# Patient Record
Sex: Male | Born: 1985 | Race: White | Hispanic: No | Marital: Single | Smoking: Never smoker
Health system: Southern US, Community
[De-identification: ages and names within clinical notes are randomized; demographics above are authoritative.]

## PROBLEM LIST (undated history)

## (undated) ENCOUNTER — Emergency Department (HOSPITAL_COMMUNITY): Source: Home / Self Care

## (undated) DIAGNOSIS — I2699 Other pulmonary embolism without acute cor pulmonale: Secondary | ICD-10-CM

## (undated) DIAGNOSIS — J189 Pneumonia, unspecified organism: Secondary | ICD-10-CM

## (undated) DIAGNOSIS — I10 Essential (primary) hypertension: Secondary | ICD-10-CM

## (undated) DIAGNOSIS — I82409 Acute embolism and thrombosis of unspecified deep veins of unspecified lower extremity: Secondary | ICD-10-CM

## (undated) DIAGNOSIS — K219 Gastro-esophageal reflux disease without esophagitis: Secondary | ICD-10-CM

## (undated) DIAGNOSIS — M1990 Unspecified osteoarthritis, unspecified site: Secondary | ICD-10-CM

## (undated) DIAGNOSIS — R4189 Other symptoms and signs involving cognitive functions and awareness: Secondary | ICD-10-CM

## (undated) DIAGNOSIS — E7800 Pure hypercholesterolemia, unspecified: Secondary | ICD-10-CM

## (undated) DIAGNOSIS — M4326 Fusion of spine, lumbar region: Secondary | ICD-10-CM

## (undated) DIAGNOSIS — F988 Other specified behavioral and emotional disorders with onset usually occurring in childhood and adolescence: Secondary | ICD-10-CM

## (undated) HISTORY — PX: OTHER SURGICAL HISTORY: SHX169

## (undated) HISTORY — PX: FRACTURE SURGERY: SHX138

## (undated) HISTORY — DX: Other pulmonary embolism without acute cor pulmonale: I26.99

## (undated) HISTORY — DX: Pneumonia, unspecified organism: J18.9

## (undated) HISTORY — DX: Fusion of spine, lumbar region: M43.26

---
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
## 2009-02-04 ENCOUNTER — Encounter: Payer: Self-pay | Admitting: Gastroenterology

## 2009-04-15 ENCOUNTER — Encounter: Payer: Self-pay | Admitting: Gastroenterology

## 2009-08-27 ENCOUNTER — Encounter: Payer: Self-pay | Admitting: Gastroenterology

## 2009-09-03 ENCOUNTER — Encounter: Payer: Self-pay | Admitting: Gastroenterology

## 2009-09-16 ENCOUNTER — Encounter: Payer: Self-pay | Admitting: Gastroenterology

## 2009-10-31 ENCOUNTER — Emergency Department (HOSPITAL_COMMUNITY): Admission: EM | Admit: 2009-10-31 | Discharge: 2009-10-31 | Payer: Self-pay | Admitting: Emergency Medicine

## 2009-11-07 DIAGNOSIS — Z720 Tobacco use: Secondary | ICD-10-CM

## 2009-11-07 DIAGNOSIS — E785 Hyperlipidemia, unspecified: Secondary | ICD-10-CM

## 2009-11-07 DIAGNOSIS — F32A Depression, unspecified: Secondary | ICD-10-CM

## 2009-11-07 DIAGNOSIS — F411 Generalized anxiety disorder: Secondary | ICD-10-CM

## 2009-11-07 DIAGNOSIS — F17200 Nicotine dependence, unspecified, uncomplicated: Secondary | ICD-10-CM | POA: Insufficient documentation

## 2009-11-07 DIAGNOSIS — F909 Attention-deficit hyperactivity disorder, unspecified type: Secondary | ICD-10-CM | POA: Insufficient documentation

## 2009-11-07 DIAGNOSIS — F329 Major depressive disorder, single episode, unspecified: Secondary | ICD-10-CM

## 2009-11-07 DIAGNOSIS — R1319 Other dysphagia: Secondary | ICD-10-CM

## 2009-11-07 DIAGNOSIS — K219 Gastro-esophageal reflux disease without esophagitis: Secondary | ICD-10-CM | POA: Insufficient documentation

## 2009-11-07 HISTORY — DX: Hyperlipidemia, unspecified: E78.5

## 2009-11-07 HISTORY — DX: Gastro-esophageal reflux disease without esophagitis: K21.9

## 2009-11-07 HISTORY — DX: Generalized anxiety disorder: F41.1

## 2009-11-07 HISTORY — DX: Tobacco use: Z72.0

## 2009-11-07 HISTORY — DX: Depression, unspecified: F32.A

## 2009-11-07 HISTORY — DX: Other dysphagia: R13.19

## 2009-11-07 HISTORY — DX: Attention-deficit hyperactivity disorder, unspecified type: F90.9

## 2009-11-12 ENCOUNTER — Ambulatory Visit: Payer: Self-pay | Admitting: Gastroenterology

## 2009-11-12 ENCOUNTER — Encounter (INDEPENDENT_AMBULATORY_CARE_PROVIDER_SITE_OTHER): Payer: Self-pay | Admitting: *Deleted

## 2009-11-13 ENCOUNTER — Ambulatory Visit: Payer: Self-pay | Admitting: Gastroenterology

## 2009-11-15 ENCOUNTER — Encounter: Payer: Self-pay | Admitting: Gastroenterology

## 2010-02-25 NOTE — Letter (Signed)
Summary: [facility]   Imported By: Sherian Rein 11/19/2009 07:38:05  _____________________________________________________________________  External Attachment:    Type:   Image     Comment:   External Document

## 2010-02-25 NOTE — Procedures (Signed)
Summary: Upper Endoscopy  Patient: Andre Cervantes Note: All result statuses are Final unless otherwise noted.  Tests: (1) Upper Endoscopy (EGD)   EGD Upper Endoscopy       DONE     [facility]     [address]     [place], [place]  [address]           ENDOSCOPY PROCEDURE REPORT           PATIENT:  Andre Cervantes, Andre Cervantes  MR#:  [id number]     BIRTHDATE:  [birth year], 24 yrs. old  GENDER:  male           ENDOSCOPIST:  Vania Rea. Jarold Motto, MD, [facility]     Referred by:  Vira Browns, M.D.           PROCEDURE DATE:  11/13/2009     PROCEDURE:  EGD with biopsy, Maloney Dilation of Esophagus     ASA CLASS:  Class I     INDICATIONS:  dysphagia, GERD           MEDICATIONS:   Fentanyl 50 mcg IV, Versed 4 mg IV     TOPICAL ANESTHETIC:  Exactacain Spray           DESCRIPTION OF PROCEDURE:   After the risks benefits and     alternatives of the procedure were thoroughly explained, informed     consent was obtained.  The LB GIF-H180 [id number] endoscope was     introduced through the mouth and advanced to the second portion of     the duodenum, without limitations.  The instrument was slowly     withdrawn as the mucosa was fully examined.     <<PROCEDUREIMAGES>>           The upper, middle, and distal third of the esophagus were     carefully inspected and no abnormalities were noted. The z-line     was well seen at the GEJ. The endoscope was pushed into the fundus     which was normal including a retroflexed view. The antrum,gastric     body, first and second part of the duodenum were unremarkable.     ESOPHAGEAL BIOPSIES DONE.DILATED #36F MALONEY DILATOR     Retroflexed views revealed no abnormalities.    The scope was then     withdrawn from the patient and the procedure completed.           COMPLICATIONS:  None           ENDOSCOPIC IMPRESSION:     1) Normal EGD     ?? OCCULT STRICTURE DILATED.R/O EOSINOPHILIC ESOPHAGITIS.     RECOMMENDATIONS:     1) Await biopsy results     2)  Clear liquids until, then soft foods rest iof day. Resume     prior diet tomorrow.     3) continue PPI           REPEAT EXAM:  No           ______________________________     Vania Rea. Jarold Motto, MD, [facility]           CC:           n.     eSIGNED:   Vania Rea. Patterson at 11/13/2009 12:20 PM           Tauheed, Mcf[place], [id number]  Note: An exclamation mark (!) indicates a result that was not dispersed into the flowsheet. Document Creation Date: 11/13/2009 12:21 PM _______________________________________________________________________  Marland Kitchen  1) Order result status: Final Collection or observation date-time: 11/13/2009 12:14 Requested date-time:  Receipt date-time:  Reported date-time:  Referring Physician:   Ordering Physician: Sheryn Bison [phone]) Specimen Source:  Source: Launa Grill Order Number: [id number] Lab site:

## 2010-02-25 NOTE — Letter (Signed)
Summary: Patient [facility] Biopsy Results  [facility]  [address] [place], [place] [address]   Phone: [phone]  Fax: [phone]        November 15, 2009 MRN: [id number]    [facility] [address] [place], [place]  [address]    Dear Andre Cervantes,  I am pleased to inform you that the biopsies taken during your recent endoscopic examination did not show any evidence of cancer upon pathologic examination.  Additional information/recommendations:  __No further action is needed at this time.  Please follow-up with      your primary care physician for your other healthcare needs.  __ Please call [phone] to schedule a return visit to review      your condition.  xx__ Continue with the treatment plan as outlined on the day of your      exam.  __ You should have a repeat endoscopic examination for this problem              in _ months/years.   Please call us if you are having persistent problems or have questions about your condition that have not been fully answered at this time.  Sincerely,  Andre Layman MD [facility]  This letter has been electronically signed by your physician.

## 2010-02-25 NOTE — Letter (Signed)
Summary: EGD Instructions  [facility]  [address] [place], [place] [address]   Phone: [phone]  Fax: [phone]       Andre Cervantes    04/21/1985    MRN: [id number]       Procedure Day /Date: 11/13/2009 Wednesday     Arrival Time: 10:30am     Procedure Time: 11:30am     Location of Procedure:                    [facility] (4th Floor)    PREPARATION FOR ENDOSCOPY   On 11/13/2009 THE DAY OF THE PROCEDURE:  1.   No solid foods, milk or milk products are allowed after midnight the night before your procedure.  2.   Do not drink anything colored red or purple.  Avoid juices with pulp.  No orange juice.  3.  You may drink clear liquids until 9:30am, which is 2 hours before your procedure.                                                                                                CLEAR LIQUIDS INCLUDE: Water Jello Ice Popsicles Tea (sugar ok, no milk/cream) Powdered fruit flavored drinks Coffee (sugar ok, no milk/cream) Gatorade Juice: apple, white grape, white cranberry  Lemonade Clear bullion, consomm, broth Carbonated beverages (any kind) Strained chicken noodle soup Hard Candy   MEDICATION INSTRUCTIONS  Unless otherwise instructed, you should take regular prescription medications with a small sip of water as early as possible the morning of your procedure.                OTHER INSTRUCTIONS  You will need a responsible adult at least 24 years of age to accompany you and drive you home.   This person must remain in the waiting room during your procedure.  Wear loose fitting clothing that is easily removed.  Leave jewelry and other valuables at home.  However, you may wish to bring a book to read or an iPod/MP3 player to listen to music as you wait for your procedure to start.  Remove all body piercing jewelry and leave at home.  Total time from sign-in until discharge is approximately 2-3 hours.  You should  go home directly after your procedure and rest.  You can resume normal activities the day after your procedure.  The day of your procedure you should not:   Drive   Make legal decisions   Operate machinery   Drink alcohol   Return to work  You will receive specific instructions about eating, activities and medications before you leave.    The above instructions have been reviewed and explained to me by   _______________________    I fully understand and can verbalize these instructions _____________________________ Date _________

## 2010-02-25 NOTE — Letter (Signed)
Summary: New Patient letter  [facility]  [address] [place], [place] [address]   Phone: [phone]  Fax: [phone]       09/03/2009 MRN: [id number]  [facility] [address] [place], [place]  [address]  Dear Mr. Wingrove,  Welcome to the Gastroenterology Division at [facility].    You are scheduled to see Dr.  Jarold Motto on 09-17-09 at 8:30am on the 3rd floor at [facility], [address]  We ask that you try to arrive at our office 15 minutes prior to your appointment time to allow for check-in.  We would like you to complete the enclosed self-administered evaluation form prior to your visit and bring it with you on the day of your appointment.  We will review it with you.  Also, please bring a complete list of all your medications or, if you prefer, bring the medication bottles and we will list them.  Please bring your insurance card so that we may make a copy of it.  If your insurance requires a referral to see a specialist, please bring your referral form from your primary care physician.  Co-payments are due at the time of your visit and may be paid by cash, check or credit card.     Your office visit will consist of a consult with your physician (includes a physical exam), any laboratory testing he/she may order, scheduling of any necessary diagnostic testing (e.g. x-ray, ultrasound, CT-scan), and scheduling of a procedure (e.g. Endoscopy, Colonoscopy) if required.  Please allow enough time on your schedule to allow for any/all of these possibilities.    If you cannot keep your appointment, please call [phone] to cancel or reschedule prior to your appointment date.  This allows [place] the opportunity to schedule an appointment for another patient in need of care.  If you do not cancel or reschedule by 5 p.m. the business day prior to your appointment date, you will be charged a $50.00 late cancellation/no-show fee.    Thank you for choosing  [facility] for your medical needs.  We appreciate the opportunity to care for you.  Please visit [place] at our website  to learn more about our practice.                     Sincerely,                                                             The Gastroenterology Division

## 2010-02-25 NOTE — Letter (Signed)
Summary: New Patient letter  [facility]  [address] [place], [place] [address]   Phone: [phone]  Fax: [phone]       09/16/2009 MRN: [id number]  [facility] [address] [place], [place]  [address]  Dear Andre Cervantes,  Welcome to the Gastroenterology Division at [facility].    You are scheduled to see Dr.  Jarold Motto on 11/12/09 at 1:30pm on the 3rd floor at [facility], [address].  We ask that you try to arrive at our office 15 minutes prior to your appointment time to allow for check-in.  We would like you to complete the enclosed self-administered evaluation form prior to your visit and bring it with you on the day of your appointment.  We will review it with you.  Also, please bring a complete list of all your medications or, if you prefer, bring the medication bottles and we will list them.  Please bring your insurance card so that we may make a copy of it.  If your insurance requires a referral to see a specialist, please bring your referral form from your primary care physician.  Co-payments are due at the time of your visit and may be paid by cash, check or credit card.     Your office visit will consist of a consult with your physician (includes a physical exam), any laboratory testing he/she may order, scheduling of any necessary diagnostic testing (e.g. x-ray, ultrasound, CT-scan), and scheduling of a procedure (e.g. Endoscopy, Colonoscopy) if required.  Please allow enough time on your schedule to allow for any/all of these possibilities.    If you cannot keep your appointment, please call [phone] to cancel or reschedule prior to your appointment date.  This allows [place] the opportunity to schedule an appointment for another patient in need of care.  If you do not cancel or reschedule by 5 p.m. the business day prior to your appointment date, you will be charged a $50.00 late cancellation/no-show fee.    Thank you for  choosing [facility] for your medical needs.  We appreciate the opportunity to care for you.  Please visit [place] at our website  to learn more about our practice.                     Sincerely,                                                             The Gastroenterology Division

## 2010-02-25 NOTE — Letter (Signed)
Summary: [facility]   Imported By: Sherian Rein 11/19/2009 07:36:53  _____________________________________________________________________  External Attachment:    Type:   Image     Comment:   External Document

## 2010-02-25 NOTE — Assessment & Plan Note (Signed)
Summary: GERD & RECURRENT DYSPHAGIA/YF   History of Present Illness Visit Type: Initial Consult Primary GI MD: Sheryn Bison MD FACP FAGA Primary Demontay Grantham: Caffie Damme, MD Requesting Quandra Fedorchak: Caffie Damme, MD Chief Complaint: dysphagia, chronic; patient cannot swallow food r liquid without great difficulty, symptoms have lasted x 1 month History of Present Illness:   25 year old Caucasian male he was had rather typical acid reflux for the last year responsive to omeprazole 40 mg a day. He has had progressive dysphagia over the last year for solids and liquids. Initial barium swallow in January of 2011 was unremarkable.  He denies anorexia, weight loss, or systemic complaints. He has not had frequent infections or antibiotic use, and does not have any immune deficiency problems. He does take amitriptyline 10 mg at bedtime for headaches and Crestor 20 mg a day.  His father also suffers from acid reflux problems. The patient denies frequent allergies or other systemic complaints although he has had a number of orthopedic problems.   GI Review of Systems    Reports acid reflux, belching, bloating, dysphagia with liquids, dysphagia with solids, heartburn, and  vomiting.      Denies abdominal pain, chest pain, loss of appetite, nausea, vomiting blood, weight loss, and  weight gain.        Denies anal fissure, black tarry stools, change in bowel habit, constipation, diarrhea, diverticulosis, fecal incontinence, heme positive stool, hemorrhoids, irritable bowel syndrome, jaundice, light color stool, liver problems, rectal bleeding, and  rectal pain. Preventive Screening-Counseling & Management  Alcohol-Tobacco     Smoking Status: never    Current Medications (verified): 1)  Omeprazole 20 Mg Cpdr (Omeprazole) .... Take One By Mouth Two Times A Day 2)  Crestor 20 Mg Tabs (Rosuvastatin Calcium) .... Take 1 Tablet By Mouth At Bedtime 3)  Amitriptyline Hcl 10 Mg Tabs (Amitriptyline Hcl) ....  Take 1 Tablet By Mouth At Bedtime  Allergies (verified): 1)  ! Morphine  Past History:  Past medical, surgical, family and social histories (including risk factors) reviewed for relevance to current acute and chronic problems.  Past Medical History: Current Problems:  OTHER DYSPHAGIA (ICD-787.29) ADHD (ICD-314.01) TOBACCO ABUSE (ICD-305.1) HYPERLIPIDEMIA (ICD-272.4) GERD (ICD-530.81) DEPRESSION (ICD-311) ANXIETY (ICD-300.00)  Past Surgical History: Reviewed history from 11/07/2009 and no changes required. 03/11/2002 Arthroscopic debridement, right knee, with synovectomy and removal of     loose bodies x2. Arthroscopic lateral release. Microfracture technique to the patella. Open vastus medialis obliquus advancement with medial patellofemoral     ligament imbrication.  Open excision of popliteal cyst, Bakers Cyst  06/28/2003 Arthroscopic evaluation, left knee. Arthroscopic lateral release. Open, vastus medialis obliquus advancement and medial capsular plication.  Family History: Reviewed history from 11/07/2009 and no changes required. Family History of Heart Disease:  Oral Cancer:  Family History of Hypertension:  Family History of Stomach Cancer:Paternal Uncle Family History of Colon Polyps:Paternal GF  Social History: Reviewed history from 11/07/2009 and no changes required. Uses Smokeless tobacco Occupation: Disabled Patient has never smoked.  Alcohol Use - no Smoking Status:  never  Review of Systems       The patient complains of arthritis/joint pain, back pain, cough, headaches-new, and sore throat.  The patient denies allergy/sinus, anemia, anxiety-new, blood in urine, breast changes/lumps, change in vision, confusion, coughing up blood, depression-new, fainting, fatigue, fever, hearing problems, heart murmur, heart rhythm changes, itching, muscle pains/cramps, night sweats, nosebleeds, shortness of breath, skin rash, sleeping problems, swelling of feet/legs,  swollen lymph glands, thirst - excessive, urination -  excessive, urination changes/pain, urine leakage, vision changes, and voice change.    Vital Signs:  Patient profile:   25 year old male Height:      69 inches Weight:      182.13 pounds BMI:     26.99 Pulse rate:   80 / minute Pulse rhythm:   regular BP sitting:   126 / 98  (left arm) Cuff size:   regular  Vitals Entered By: June McMurray CMA Duncan Dull) (November 12, 2009 1:25 PM)  Physical Exam  General:  Well developed, well nourished, no acute distress. Head:  Normocephalic and atraumatic. Eyes:  PERRLA, no icterus. Mouth:  No deformity or lesions, dentition normal.enlarged tonsils left greater than right. No obvious mucosal lesions noted. I cannot appreciate thyromegaly. Neck:  Supple; no masses or thyromegaly. Lungs:  Clear throughout to auscultation. Heart:  Regular rate and rhythm; no murmurs, rubs,  or bruits. Abdomen:  Soft, nontender and nondistended. No masses, hepatosplenomegaly or hernias noted. Normal bowel sounds. Msk:  Symmetrical with no gross deformities. Normal posture. Pulses:  Normal pulses noted. Extremities:  No clubbing, cyanosis, edema or deformities noted. Neurologic:  Alert and  oriented x4;  grossly normal neurologically. Cervical Nodes:  No significant cervical adenopathy. Psych:  Alert and cooperative. Normal mood and affect.depressed affect.     Impression & Recommendations:  Problem # 1:  GERD (ICD-530.81) Assessment Improved continue reflux regime and omeprazole 40 mg a day.  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 Endoscopy with biopsies and esophageal dilatation has been scheduled. The risk and benefits have been explained to the patient and his father who was present during this interview.  Problem # 3:  TOBACCO ABUSE (ICD-305.1) Assessment: Unchanged the  patient chews tobacco regularly but denies alcohol abuse.  Problem # 4:  DEPRESSION (ICD-311) Assessment: Unchanged  Other Orders: EGD (EGD)  Patient Instructions: 1)  Copy sent to : Caffie Damme, MD 2)  [facility] Patient Information Guide given to patient.  3)  Avoid foods high in acid content ( tomatoes, citrus juices, spicy foods) . Avoid eating within 3 to 4 hours of lying down or before exercising. Do not over eat; try smaller more frequent meals. Elevate head of bed four inches when sleeping.  4)  GI Reflux brochure given.  5)  Upper Endoscopy brochure given.  6)  Your procedure has been scheduled for 11/13/2009, please follow the seperate instructions.  7)  The medication list was reviewed and reconciled.  All changed / newly prescribed medications were explained.  A complete medication list was provided to the patient / caregiver. 8)  Upper Endoscopy with Dilatation brochure given.

## 2010-05-17 ENCOUNTER — Emergency Department (HOSPITAL_COMMUNITY): Payer: Medicare Other

## 2010-05-17 ENCOUNTER — Emergency Department (HOSPITAL_COMMUNITY)
Admission: EM | Admit: 2010-05-17 | Discharge: 2010-05-17 | Disposition: A | Discharge status: Home / Self Care | Payer: Medicare Other | Attending: Emergency Medicine | Admitting: Emergency Medicine

## 2010-05-17 DIAGNOSIS — R079 Chest pain, unspecified: Secondary | ICD-10-CM | POA: Insufficient documentation

## 2010-05-17 DIAGNOSIS — F17200 Nicotine dependence, unspecified, uncomplicated: Secondary | ICD-10-CM | POA: Insufficient documentation

## 2010-05-17 LAB — URINALYSIS, ROUTINE W REFLEX MICROSCOPIC
Ketones, ur: NEGATIVE mg/dL
Nitrite: NEGATIVE
pH: 7 (ref 5.0–8.0)

## 2010-06-13 NOTE — Op Note (Signed)
NAME:  Andre Cervantes, Andre Cervantes                       ACCOUNT NO.:  [id number]   MEDICAL RECORD NO.:  [id number]                   PATIENT TYPE:  AMB   LOCATION:  [facility]                                  FACILITY:  [facility]   PHYSICIAN:  Claude Manges. Cleophas Dunker, M.D.            DATE OF BIRTH:  Jan 14, 1986   DATE OF PROCEDURE:  06/28/2003  DATE OF DISCHARGE:                                 OPERATIVE REPORT   PREOPERATIVE DIAGNOSIS:  Recurrent, lateral subluxation, left patella.   POSTOPERATIVE DIAGNOSIS:  Recurrent, lateral subluxation, left patella.   PROCEDURE:  1. Arthroscopic evaluation, left knee.  2. Arthroscopic lateral release.  3. Open, vastus medialis obliquus advancement and medial capsular plication.   SURGEON:  Claude Manges. Cleophas Dunker, M.D.   ASSISTANT:  Geralyn Flash, P.A.-C.   ANESTHESIA:  General orotracheal.   COMPLICATIONS:  None.   HISTORY:  A 25 year old, young man, has a history of bilateral patellar  subluxation.  She is over-a-year status post arthroscopic lateral release of  his right patella and a medial plication, and he apparently has had some  recurrent problems with that same knee.  He has developed more problems on  the left with recurrent subluxation and recurrent effusion, and is now to  have an arthroscopic evaluation with medial plication and vastus medialis  obliquus advancement.   DESCRIPTION OF PROCEDURE:  With the patient comfortable on the operating  room table and under general orotracheal anesthesia, the left lower  extremity was placed in a thigh tourniquet.  The leg was then prepped with  DuraPrep from the tourniquet and the thigh holder to the mid foot, sterile  draping was performed, with the extremity elevated with Esmarch  exsanguinated with the proximal tourniquet 350 mmHg.   Diagnostic arthroscopy was performed using a medial and lateral parapatellar  type of stab wound.  A diagnostic arthroscopy did not reveal evidence of a  loose body.  The patella  was hypermobile and was associated with a lateral  tilt.  I did not see any appreciable chondromalacia of the trochlea or of  the patella.  Both medial and lateral menisci were intact, as was the ACL.  The patient has very loose joints with hyperextension and very hypermobile  patellae.   An arthroscopic lateral release was then performed.  A spinal needle was  placed in the superior portion of the superior pouch laterally and then the  Bovie was introduced, and had a very nice lateral release along the entire  lateral capsule with significant change in the position of the patella.  The  joint was inspected without evidence of loose material.  I did not see any  bleeding vessels.  Irrigation solution was then removed.   I re-draped, placed the foot of the bed in extension and applied new gloves.   An incision was made longitudinally along the lateral joint about a  centimeter medial to the medial edge of the patella.  The  2-inch was then  incised and very sharp dissection carried down to the subcutaneous tissue.  The fascia was then incised over the lateral capsule and the vastus medialis  obliquus.  An advancement of the VMO was then performed.  Using the needle-  tip Bovie, the distal portion of the VMO was released from its fascial  attachment, without entering the joint.  I was able to advance the muscle  and its fascia to the mid-superior portion of the patella.  Prior to that, a  medial capsular plication was performed by incising the capsule and suturing  in a pants-over-vest fashion about a centimeter from the edge of the  patella.   Then alone, provided a nice stability.  I was no longer able to sublux the  patella laterally but he had similar procedure on the right knee, as he had  recurrent subluxation and therefore, I felt advancement of the VMO would  provide added stability.   The VMO was then advanced to the mid distal portion of the mid proximal  portion of the  patella.  The fascia was sutured to fascia with 0 Ethibond  suture.  Wound was then irrigated with saline solution.  The subcu was then  closed with 2-0 Vicryl, the skin was closed with a running 3-0 subcuticular  Prolene with Steri-Strips over Benzoin, 0.25% Marcaine with epinephrine  injected into the wound edges and to the knee joint.  Sterile bulky dressing  was applied followed by an Ace bandage and a knee immobilizer.  Tourniquet  was deflated at approximately 55 minutes.   The patient tolerated the procedure without complications.   PLAN:  Vicodin for pain, crutches, office  in one week.                                               Claude Manges. Cleophas Dunker, M.D.    PWW/MEDQ  D:  06/28/2003  T:  06/28/2003  Job:  [id number]

## 2010-06-13 NOTE — Op Note (Signed)
NAME:  Andre Cervantes, Andre Cervantes                       ACCOUNT NO.:  [id number]   MEDICAL RECORD NO.:  [id number]                   PATIENT TYPE:  AMB   LOCATION:  DSC                                  FACILITY:  [facility]   PHYSICIAN:  Claude Manges. Cleophas Dunker, M.D.            DATE OF BIRTH:  [birth year]   DATE OF PROCEDURE:  03/14/2002  DATE OF DISCHARGE:                                 OPERATIVE REPORT   PREOPERATIVE DIAGNOSES:  1. Status post lateral dislocation, right patella, with intra-articular     loose bodies.  2. Painful Baker's cyst.   POSTOPERATIVE DIAGNOSES:  1. Status post lateral dislocation, right patella, with intra-articular     loose bodies.  2. Painful Baker's cyst.   PROCEDURE:  1. Arthroscopic debridement, right knee, with synovectomy and removal of     loose bodies x2.  2. Arthroscopic lateral release.  3. Microfracture technique to the patella.  4. Open vastus medialis obliquus advancement with medial patellofemoral     ligament imbrication.  5. Open excision of popliteal cyst.   SURGEON:  Claude Manges. Cleophas Dunker, M.D.   ASSISTANT:  Jamelle Rushing, P.A.   ANESTHESIA:  General orotracheal.   COMPLICATIONS:  None.   HISTORY:  [age]-year-old young man sustained a traumatic dislocation of  his right knee approximately a month ago and according to his family, he has  had other episodes of what appear to be spontaneous dislocation or  subluxation with spontaneous reduction.  He has developed a painful knee,  particularly along the medial parapatellar region, for the sensations of a  catching in his knee.  He also has a large Baker's cyst which has been  present for a long time that has caused pressure and pain behind his knee.  Neurovascular exam is intact.  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  He is now to have  arthroscopic evaluation.   DESCRIPTION OF PROCEDURE:  With the patient comfortable on the operating  room and under general orotracheal anesthesia, the right lower extremity was  placed in a thigh holder and a thigh tourniquet.  The leg was then prepped  with Duraprep from the thigh holder to the ankle.  Sterile draping was  performed.  With the extremity elevated, it was Esmarch-exsanguinated with a  proximal tourniquet at 325 mmHg.   Diagnostic arthroscopy was performed using a medial and lateral parapatellar  tendon stab wound.  There was a small hemarthrosis.   Diagnostic arthroscopy revealed a large defect in the lateral patellar facet  that was consistent with an osteochondral lesion; there was also a large  osteochondral loose body in the lateral gutter that measured over a  centimeter in diameter.  I enlarged the medial puncture site and removed the  loose body in several pieces.  There was another loose body that was hanging  from the patella which was also removed.  There was some early cartilage  formation over the patellar defect; I supplemented this with a microfascial  technique.  There was diffuse synovitis and this was debrided with the intra-  articular shaver and the Arthrocare wand.  Both medial and lateral  compartments were clear of meniscal pathology or chondromalacia.  The ACL  appeared to be intact.  The patient did have a loose knee with  hyperextension and an anterior drawer sign, but very similar to the opposite  side.   An arthroscopic lateral release was then performed with the intra-articular  Bovie; he had a very nice release.  I could still subluxate the patella and  actually could catch the patella subluxating or dislocating laterally, so I  elected to perform a medial imbrication procedure.  A 4-cm incision was then  made in the superomedial aspect of the patella and via sharp dissection,  drained the subcutaneous tissue.  Self-retaining retractors were then  inserted.  The  vastus medialis obliquus was identified.  It was carefully  incised from its attachment to its fascia so that I could advance it over  the patella.  The patellofemoral ligament was not ruptured, but it certainly  seemed to be elongated or stretched out, so this was imbricated at the level  of the patella and even with simply that procedure, the patella would not  dislocate laterally.  The VMO was then advanced distally over the patella  and this was secured with 0 Tycron suture.  At that point, I could not  dislocate the patella laterally, but felt that it was not too tight.  This  wound was then closed with 2- and 3-0 Vicryl and then skin clips.  The  medial puncture site was closed with 3-0 Vicryl and Prolene.  Marcaine 0.25%  with epinephrine was then injected into the joint and each of the wounds.   Tourniquet was deflated, a sterile bulky dressing was placed about the  wound.   The patient was then placed in the prone position and secured medially and  laterally with soft cushions.  The right lower extremity was then reprepped  and draped and we regowned and gloved.  After circumferential Duraprep, the  extremity was elevated and Esmarch-exsanguinated after at least 15 minutes  of the tourniquet being off the leg.  The tourniquet was then elevated to  325 mmHg.   A curvilinear incision was then made proximal and laterally, coursing along  the flexion crease of the popliteal space and then coursing over the  popliteal cyst medially.  Via sharp dissection, incision was carried down to  subcutaneous tissue, then the fascia was carefully incised.  Any small  neurovascular bundles were carefully projected.  The cyst was identified  medial to the medial hamstring and gastrosoleus and the gastrosoleus was  then carefully retracted laterally.  Large neurovascular bundles were well  out of the operative field.  Cyst was identified.  It was carefully and circumferentially excised from its  soft tissue attachments.  It was then  incised.  Its contents were removed with suction.  Allis clamp was applied  and with traction, I was able to circumferentially excise the cyst from its  soft tissue attachment and then ultimately from its attachment to the  posteromedial capsule.  A small window was then made, any bleeders were  Bovie-coagulated.  I felt that I had removed the entire cyst.  The wound was  then irrigated with saline solution.  The fascia was closed with 0- and 2-0  Vicryl, subcu with 3-0 Vicryl, skin closed with skin clips, Marcaine 0.25%  with epinephrine injected into wound edges.  Sterile bulky dressing was then  made circumferentially about the knee, followed by an Ace bandage and a knee  immobilizer.   Tourniquet was deflated; there was immediate capillary refill to the toes.   Patient tolerated the procedure without complications.   PLAN:  Percocet and oxycodone for pain, crutches, to office in one week.                                               Claude Manges. Cleophas Dunker, M.D.   PWW/MEDQ  D:  03/14/2002  T:  03/14/2002  Job:  [id number]

## 2011-01-27 DIAGNOSIS — I2699 Other pulmonary embolism without acute cor pulmonale: Secondary | ICD-10-CM

## 2011-01-27 DIAGNOSIS — I82409 Acute embolism and thrombosis of unspecified deep veins of unspecified lower extremity: Secondary | ICD-10-CM | POA: Insufficient documentation

## 2011-01-27 HISTORY — DX: Other pulmonary embolism without acute cor pulmonale: I26.99

## 2011-01-27 HISTORY — PX: KNEE SURGERY: SHX244

## 2011-01-27 HISTORY — DX: Acute embolism and thrombosis of unspecified deep veins of unspecified lower extremity: I82.409

## 2011-03-06 ENCOUNTER — Encounter (HOSPITAL_COMMUNITY): Payer: Self-pay | Admitting: Emergency Medicine

## 2011-03-06 ENCOUNTER — Emergency Department (HOSPITAL_COMMUNITY)
Admission: EM | Admit: 2011-03-06 | Discharge: 2011-03-06 | Disposition: A | Discharge status: Home / Self Care | Payer: Medicare Other | Attending: Emergency Medicine | Admitting: Emergency Medicine

## 2011-03-06 ENCOUNTER — Emergency Department (HOSPITAL_COMMUNITY): Payer: Medicare Other

## 2011-03-06 DIAGNOSIS — Z79899 Other long term (current) drug therapy: Secondary | ICD-10-CM | POA: Insufficient documentation

## 2011-03-06 DIAGNOSIS — R109 Unspecified abdominal pain: Secondary | ICD-10-CM | POA: Insufficient documentation

## 2011-03-06 DIAGNOSIS — M545 Low back pain, unspecified: Secondary | ICD-10-CM | POA: Insufficient documentation

## 2011-03-06 HISTORY — DX: Other specified behavioral and emotional disorders with onset usually occurring in childhood and adolescence: F98.8

## 2011-03-06 LAB — CBC
MCH: 31.1 pg (ref 26.0–34.0)
MCHC: 35.5 g/dL (ref 30.0–36.0)
Platelets: 341 10*3/uL (ref 150–400)

## 2011-03-06 LAB — POCT I-STAT, CHEM 8
HCT: 44 % (ref 39.0–52.0)
Hemoglobin: 15 g/dL (ref 13.0–17.0)
Sodium: 144 mEq/L (ref 135–145)
TCO2: 27 mmol/L (ref 0–100)

## 2011-03-06 LAB — URINALYSIS, ROUTINE W REFLEX MICROSCOPIC
Bilirubin Urine: NEGATIVE
Ketones, ur: NEGATIVE mg/dL
Nitrite: NEGATIVE
Specific Gravity, Urine: 1.008 (ref 1.005–1.030)
Urobilinogen, UA: 0.2 mg/dL (ref 0.0–1.0)

## 2011-03-06 NOTE — ED Notes (Signed)
Pt c/o right flank pain x's 2-3 months.  Denies any nausea or vomiting.

## 2011-03-06 NOTE — ED Provider Notes (Signed)
History     CSN: [id number]  Arrival date & time 03/06/11  1933   First MD Initiated Contact with Patient 03/06/11 2127      Chief Complaint  Patient presents with  . Flank Pain    (Consider location/radiation/quality/duration/timing/severity/associated sxs/prior treatment) HPI Comments: Patient has been complaining of low right-sided back pain for 4-5 months.  Parents became concerned today when they hurt him grunting to urinate.  Patient is mentally challenged, although high functioning  Patient is a 26 y.o. male presenting with flank pain. The history is provided by a parent.  Flank Pain This is a chronic problem. The current episode started more than 1 month ago. The problem occurs every several days. The problem has been unchanged. Pertinent negatives include no abdominal pain, chills, fever, nausea, vomiting or weakness. The symptoms are aggravated by nothing. He has tried nothing for the symptoms.  Flank Pain This is a chronic problem. The current episode started more than 1 month ago. The problem occurs every several days. The problem has been unchanged. Pertinent negatives include no abdominal pain. The symptoms are aggravated by nothing. He has tried nothing for the symptoms.    Past Medical History  Diagnosis Date  . ADD (attention deficit disorder)     Past Surgical History  Procedure Date  . Knee surgery   . Fracture surgery     No family history on file.  History  Substance Use Topics  . Smoking status: Never Smoker   . Smokeless tobacco: Current User    Types: Chew  . Alcohol Use: No      Review of Systems  Constitutional: Negative for fever and chills.  Gastrointestinal: Negative for nausea, vomiting, abdominal pain, diarrhea and constipation.  Genitourinary: Positive for flank pain. Negative for dysuria, frequency and decreased urine volume.  Musculoskeletal: Positive for back pain.  Neurological: Negative for dizziness and weakness.    Allergies   Morphine  Home Medications   Current Outpatient Rx  Name Route Sig Dispense Refill  . BUPROPION HCL ER (XL) 300 MG PO TB24 Oral Take 300 mg by mouth daily.    Marland Kitchen OMEPRAZOLE 40 MG PO CPDR Oral Take 40 mg by mouth daily.    Marland Kitchen RISPERIDONE 1 MG PO TABS Oral Take 1 mg by mouth at bedtime.    Marland Kitchen ROSUVASTATIN CALCIUM 10 MG PO TABS Oral Take 10 mg by mouth at bedtime.      BP 147/95  Pulse 98  Temp(Src) 97.5 F (36.4 C) (Oral)  Resp 18  SpO2 97%  Physical Exam  Constitutional: He appears well-developed and well-nourished.  HENT:  Head: Normocephalic.  Cardiovascular: Normal rate.   Pulmonary/Chest: Effort normal.  Abdominal: Soft.  Musculoskeletal:       Arms: Neurological: He is alert.  Skin: Skin is warm and dry.    ED Course  Procedures (including critical care time)  Labs Reviewed  URINALYSIS, ROUTINE W REFLEX MICROSCOPIC - Abnormal; Notable for the following:    APPearance CLOUDY (*)    All other components within normal limits  CBC - Abnormal; Notable for the following:    WBC 11.7 (*)    All other components within normal limits  POCT I-STAT, CHEM 8 - Abnormal; Notable for the following:    Glucose, Bld 120 (*)    All other components within normal limits   Dg Abd Acute W/chest  03/06/2011  *RADIOLOGY REPORT*  Clinical Data: Right sided flank pain for 4 months.  Sharp and intermittent.  ACUTE ABDOMEN SERIES (ABDOMEN 2 VIEW & CHEST 1 VIEW)  Comparison: Chest 05/17/2010  Findings: Slight fibrosis in the left lung base.  Normal heart size and pulmonary vascularity.  No focal airspace consolidation in the lungs.  No blunting of costophrenic angles.  No pneumothorax.  Old fracture deformity of the right clavicle.  No significant change since previous study.  Scattered stool in the colon.  No small or large bowel distension. No free intra-abdominal air.  No abnormal air fluid levels.  No radiopaque stones.  Visualized bones appear intact.  Calcification in the pelvis consistent  with phleboliths.  IMPRESSION: No evidence of active pulmonary disease.  Nonobstructive bowel gas pattern.  Original Report Authenticated By: Marlon Pel, M.D.     1. Low back pain       MDM  Low back pain without history of injury    Medical screening examination/treatment/procedure(s) were performed by non-physician practitioner and as supervising physician I was immediately available for consultation/collaboration. Osvaldo Human, M.D.     Arman Filter, NP 03/06/11 4098  Arman Filter, NP 03/06/11 1191  Carleene Cooper III, MD 03/07/11 1145

## 2011-10-25 ENCOUNTER — Inpatient Hospital Stay (HOSPITAL_COMMUNITY)
Admission: AD | Admit: 2011-10-25 | Discharge: 2011-11-02 | DRG: 175 | Disposition: A | Discharge status: Home / Self Care | Payer: Medicare Other | Source: Other Acute Inpatient Hospital | Attending: Internal Medicine | Admitting: Internal Medicine

## 2011-10-25 DIAGNOSIS — I2789 Other specified pulmonary heart diseases: Secondary | ICD-10-CM | POA: Diagnosis present

## 2011-10-25 DIAGNOSIS — I2699 Other pulmonary embolism without acute cor pulmonale: Secondary | ICD-10-CM

## 2011-10-25 DIAGNOSIS — I82409 Acute embolism and thrombosis of unspecified deep veins of unspecified lower extremity: Secondary | ICD-10-CM | POA: Diagnosis present

## 2011-10-25 DIAGNOSIS — R894 Abnormal immunological findings in specimens from other organs, systems and tissues: Secondary | ICD-10-CM | POA: Diagnosis present

## 2011-10-25 DIAGNOSIS — R0902 Hypoxemia: Secondary | ICD-10-CM | POA: Diagnosis present

## 2011-10-25 DIAGNOSIS — I498 Other specified cardiac arrhythmias: Secondary | ICD-10-CM | POA: Diagnosis present

## 2011-10-25 DIAGNOSIS — E785 Hyperlipidemia, unspecified: Secondary | ICD-10-CM | POA: Diagnosis present

## 2011-10-25 DIAGNOSIS — F17200 Nicotine dependence, unspecified, uncomplicated: Secondary | ICD-10-CM

## 2011-10-25 DIAGNOSIS — R1319 Other dysphagia: Secondary | ICD-10-CM

## 2011-10-25 DIAGNOSIS — K219 Gastro-esophageal reflux disease without esophagitis: Secondary | ICD-10-CM

## 2011-10-25 DIAGNOSIS — F329 Major depressive disorder, single episode, unspecified: Secondary | ICD-10-CM

## 2011-10-25 DIAGNOSIS — I517 Cardiomegaly: Secondary | ICD-10-CM

## 2011-10-25 DIAGNOSIS — F909 Attention-deficit hyperactivity disorder, unspecified type: Secondary | ICD-10-CM

## 2011-10-25 DIAGNOSIS — J9600 Acute respiratory failure, unspecified whether with hypoxia or hypercapnia: Secondary | ICD-10-CM | POA: Diagnosis present

## 2011-10-25 DIAGNOSIS — F411 Generalized anxiety disorder: Secondary | ICD-10-CM

## 2011-10-25 LAB — LACTIC ACID, PLASMA: Lactic Acid, Venous: 2.7 mmol/L — ABNORMAL HIGH (ref 0.5–2.2)

## 2011-10-25 LAB — COMPREHENSIVE METABOLIC PANEL
AST: 34 U/L (ref 0–37)
Albumin: 3.9 g/dL (ref 3.5–5.2)
Alkaline Phosphatase: 161 U/L — ABNORMAL HIGH (ref 39–117)
BUN: 11 mg/dL (ref 6–23)
Chloride: 101 mEq/L (ref 96–112)
Potassium: 5 mEq/L (ref 3.5–5.1)
Sodium: 138 mEq/L (ref 135–145)
Total Protein: 8.2 g/dL (ref 6.0–8.3)

## 2011-10-25 LAB — POCT I-STAT 3, ART BLOOD GAS (G3+)
Acid-base deficit: 1 mmol/L (ref 0.0–2.0)
Patient temperature: 98.6
pH, Arterial: 7.474 — ABNORMAL HIGH (ref 7.350–7.450)

## 2011-10-25 LAB — CBC
MCH: 31.7 pg (ref 26.0–34.0)
MCV: 87.7 fL (ref 78.0–100.0)
Platelets: 371 10*3/uL (ref 150–400)
RDW: 12.3 % (ref 11.5–15.5)
WBC: 19.8 10*3/uL — ABNORMAL HIGH (ref 4.0–10.5)

## 2011-10-25 LAB — DIFFERENTIAL
Basophils Absolute: 0.1 10*3/uL (ref 0.0–0.1)
Eosinophils Absolute: 0.2 10*3/uL (ref 0.0–0.7)
Eosinophils Relative: 1 % (ref 0–5)
Lymphocytes Relative: 26 % (ref 12–46)
Neutrophils Relative %: 65 % (ref 43–77)

## 2011-10-25 LAB — GLUCOSE, CAPILLARY: Glucose-Capillary: 82 mg/dL (ref 70–99)

## 2011-10-25 LAB — MAGNESIUM: Magnesium: 2.2 mg/dL (ref 1.5–2.5)

## 2011-10-25 LAB — MRSA PCR SCREENING: MRSA by PCR: NEGATIVE

## 2011-10-25 LAB — PROTIME-INR: Prothrombin Time: 13.6 seconds (ref 11.6–15.2)

## 2011-10-25 MED ORDER — PANTOPRAZOLE SODIUM 40 MG IV SOLR
40.0000 mg | Freq: Every day | INTRAVENOUS | Status: DC
  Administered 2011-10-25 – 2011-10-26 (×2): 40 mg via INTRAVENOUS
  Filled 2011-10-25 (×3): qty 40

## 2011-10-25 MED ORDER — SODIUM CHLORIDE 0.9 % IV SOLN: 250.0000 mL | INTRAVENOUS | Status: DC | PRN

## 2011-10-25 MED ORDER — HEPARIN (PORCINE) IN NACL 100-0.45 UNIT/ML-% IJ SOLN
1500.0000 [IU]/h | INTRAMUSCULAR | Status: DC
  Administered 2011-10-26 (×2): 1500 [IU]/h via INTRAVENOUS
  Filled 2011-10-25 (×3): qty 250

## 2011-10-25 MED ORDER — ALBUTEROL SULFATE HFA 108 (90 BASE) MCG/ACT IN AERS
4.0000 | INHALATION_SPRAY | RESPIRATORY_TRACT | Status: DC | PRN
  Filled 2011-10-25: qty 6.7

## 2011-10-25 NOTE — Progress Notes (Signed)
  Echocardiogram 2D Echocardiogram has been performed.  Andre Cervantes 10/25/2011, 11:45 PM

## 2011-10-25 NOTE — Progress Notes (Signed)
Brief Cardiology preliminary read of echo to evaluate RH function after large pulmonary embolism:  LV is hyperdynamic, EF > 70% RV is mildly to moderately dilated with dimension in the parasternal view of 3.4 cm. RV is moderately to severely hypokinetic, Estimated RV systolic pressure of 40 mmHg + CVP which is elevated.  Results relayed to Dr. Delford Field of critical care.  Please followup official read in the AM.

## 2011-10-25 NOTE — Progress Notes (Signed)
ANTICOAGULATION CONSULT NOTE - Initial Consult  Pharmacy Consult for Heparin Indication: pulmonary embolus  Allergies  Allergen Reactions  . Morphine Swelling    Patient Measurements: Height: 5\' 9"  (175.3 cm) Weight: 190 lb 4.1 oz (86.3 kg) IBW/kg (Calculated) : 70.7  Heparin Dosing Weight: 86 Kg  Vital Signs: Temp: 98.8 F (37.1 C) (09/29 2000) Temp src: Oral (09/29 2000) BP: 138/97 mmHg (09/29 2000) Pulse Rate: 132  (09/29 2100)  Labs: No results found for this basename: HGB:2,HCT:3,PLT:3,APTT:3,LABPROT:3,INR:3,HEPARINUNFRC:3,CREATININE:3,CKTOTAL:3,CKMB:3,TROPONINI:3 in the last 72 hours  The CrCl is unknown because both a height and weight (above a minimum accepted value) are required for this calculation.   Medical History: Past Medical History  Diagnosis Date  . ADD (attention deficit disorder)     Medications:  Prescriptions prior to admission  Medication Sig Dispense Refill  . buPROPion (WELLBUTRIN XL) 300 MG 24 hr tablet Take 300 mg by mouth daily.      Marland Kitchen omeprazole (PRILOSEC) 40 MG capsule Take 40 mg by mouth daily.      . risperiDONE (RISPERDAL) 1 MG tablet Take 1 mg by mouth at bedtime.      . rosuvastatin (CRESTOR) 10 MG tablet Take 10 mg by mouth at bedtime.        Assessment: Mr. Baty is admitted with a large PE with right heart strain and tachycardia. He was transferred from [facility] s/p Lovenox 100mg  SQ at 1730 today. This will provide a heparin level ~ 0.6 for upto 12 hours. Baseline labs from OSH are unremarkable except an elevated WBC (17.3) and Alkphos (155). H/H is 16.9/52, Plts 438, Scr 1.0, PT/INR: 10.7/1 and LFTs WNL.  Goal of Therapy:  Heparin level 0.3-0.7 units/ml Monitor platelets by anticoagulation protocol: Yes   Plan:  - Will start IV heparin without a bolus at 1am 9/30 (75% into the dosing interval) at 1500 units/hr. This delay will allow most of the anti-Xa effect from Lovenox to lapse, and decrease the likelihood of  supratherapeutic heparin levels. - Check CBC and heparin level at 0700 tomorrow, then daily. - Will f/up for further anticoagulation plans.  - I am aware that CCM is contemplating TPA for PE if patient's hemodynamics worsen.  Thanks, Ena Demary K. Allena Katz, PharmD, BCPS.  Clinical Pharmacist Pager [phone]. 10/25/2011 10:11 PM

## 2011-10-26 ENCOUNTER — Inpatient Hospital Stay (HOSPITAL_COMMUNITY): Payer: Medicare Other

## 2011-10-26 ENCOUNTER — Encounter (HOSPITAL_COMMUNITY): Payer: Self-pay | Admitting: Surgery

## 2011-10-26 DIAGNOSIS — I2699 Other pulmonary embolism without acute cor pulmonale: Principal | ICD-10-CM

## 2011-10-26 DIAGNOSIS — I82409 Acute embolism and thrombosis of unspecified deep veins of unspecified lower extremity: Secondary | ICD-10-CM

## 2011-10-26 LAB — BASIC METABOLIC PANEL
CO2: 25 mEq/L (ref 19–32)
Calcium: 10.3 mg/dL (ref 8.4–10.5)
Creatinine, Ser: 1.04 mg/dL (ref 0.50–1.35)

## 2011-10-26 LAB — CBC
MCH: 31 pg (ref 26.0–34.0)
MCV: 89.2 fL (ref 78.0–100.0)
Platelets: 395 10*3/uL (ref 150–400)
RDW: 12.3 % (ref 11.5–15.5)

## 2011-10-26 LAB — HEPARIN LEVEL (UNFRACTIONATED)
Heparin Unfractionated: 0.58 IU/mL (ref 0.30–0.70)
Heparin Unfractionated: 0.25 IU/mL — ABNORMAL LOW (ref 0.30–0.70)

## 2011-10-26 LAB — TROPONIN I
Troponin I: <0.3 ng/mL (ref <0.30)
Troponin I: <0.3 ng/mL (ref <0.30)

## 2011-10-26 LAB — URINALYSIS, ROUTINE W REFLEX MICROSCOPIC
Bilirubin Urine: NEGATIVE
Hgb urine dipstick: NEGATIVE
Ketones, ur: NEGATIVE mg/dL
Protein, ur: NEGATIVE mg/dL
Urobilinogen, UA: 0.2 mg/dL (ref 0.0–1.0)

## 2011-10-26 LAB — GLUCOSE, CAPILLARY
Glucose-Capillary: 89 mg/dL (ref 70–99)
Glucose-Capillary: 138 mg/dL — ABNORMAL HIGH (ref 70–99)

## 2011-10-26 MED ORDER — SODIUM CHLORIDE 0.9 % IV SOLN
250.0000 mL | INTRAVENOUS | Status: DC | PRN
  Administered 2011-10-26: 250 mL via INTRAVENOUS

## 2011-10-26 MED ORDER — BUPROPION HCL ER (XL) 300 MG PO TB24
450.0000 mg | ORAL_TABLET | Freq: Every day | ORAL | Status: DC
  Administered 2011-10-26 – 2011-11-02 (×8): 450 mg via ORAL
  Filled 2011-10-26 (×8): qty 1

## 2011-10-26 MED ORDER — ONDANSETRON HCL 4 MG/2ML IJ SOLN
4.0000 mg | Freq: Four times a day (QID) | INTRAMUSCULAR | Status: DC | PRN
  Filled 2011-10-26: qty 2

## 2011-10-26 MED ORDER — HEPARIN (PORCINE) IN NACL 100-0.45 UNIT/ML-% IJ SOLN
1900.0000 [IU]/h | INTRAMUSCULAR | Status: DC
  Administered 2011-10-27 – 2011-10-28 (×2): 1750 [IU]/h via INTRAVENOUS
  Administered 2011-10-30 – 2011-11-01 (×6): 1900 [IU]/h via INTRAVENOUS
  Filled 2011-10-26 (×15): qty 250

## 2011-10-26 MED ORDER — FENTANYL CITRATE 0.05 MG/ML IJ SOLN: 25.0000 ug | INTRAMUSCULAR | Status: DC | PRN

## 2011-10-26 MED ORDER — BUPROPION HCL ER (SR) 150 MG PO TB12: 450.0000 mg | ORAL_TABLET | Freq: Every day | ORAL | Status: DC

## 2011-10-26 MED ORDER — CHLORHEXIDINE GLUCONATE 0.12 % MT SOLN
15.0000 mL | Freq: Two times a day (BID) | OROMUCOSAL | Status: DC
  Administered 2011-10-26 – 2011-11-01 (×13): 15 mL via OROMUCOSAL
  Filled 2011-10-26 (×17): qty 15

## 2011-10-26 MED ORDER — BIOTENE DRY MOUTH MT LIQD
15.0000 mL | Freq: Two times a day (BID) | OROMUCOSAL | Status: DC
  Administered 2011-10-26 – 2011-11-01 (×11): 15 mL via OROMUCOSAL

## 2011-10-26 MED ORDER — RISPERIDONE 1 MG PO TABS
1.0000 mg | ORAL_TABLET | Freq: Every day | ORAL | Status: DC
  Administered 2011-10-26 – 2011-11-01 (×7): 1 mg via ORAL
  Filled 2011-10-26 (×8): qty 1

## 2011-10-26 MED ORDER — ACETAMINOPHEN 325 MG PO TABS
650.0000 mg | ORAL_TABLET | Freq: Four times a day (QID) | ORAL | Status: DC | PRN
  Administered 2011-10-26 – 2011-10-27 (×3): 650 mg via ORAL
  Filled 2011-10-26 (×2): qty 2

## 2011-10-26 MED ORDER — AMITRIPTYLINE HCL 25 MG PO TABS
25.0000 mg | ORAL_TABLET | Freq: Every day | ORAL | Status: DC
  Administered 2011-10-26 – 2011-11-01 (×7): 25 mg via ORAL
  Filled 2011-10-26 (×8): qty 1

## 2011-10-26 NOTE — Progress Notes (Signed)
Name: Andre Cervantes MRN: [id number] DOB: 08-Feb-1985    LOS: 1  PULMONARY / CRITICAL CARE MEDICINE  HPI:   26 years old male with PMH relevant for ADD, dyslipidemia and recent right knee surgery on September 24. Massive PE, received Lovenox from [facility].   Current Status: increase rr, tachy  Vital Signs: Temp:  [97.9 F (36.6 C)-98.8 F (37.1 C)] 97.9 F (36.6 C) (09/30 1200) Pulse Rate:  [107-136] 115  (09/30 1200) Resp:  [19-31] 26  (09/30 1200) BP: (103-138)/(32-111) 125/72 mmHg (09/30 1200) SpO2:  [89 %-96 %] 95 % (09/30 1200) Weight:  [86 kg (189 lb 9.5 oz)-86.3 kg (190 lb 4.1 oz)] 86 kg (189 lb 9.5 oz) (09/30 0500)  Physical Examination: General:  Mild respiratory distress Neuro:  Awake, alert, oriented x 3,  nonfocal HEENT:  PERRL, pink conjunctivae, dry membranes Neck:  Supple, JVD   Cardiovascular:  RRR, no M/R/G Lungs:  CTA, redcuced Abdomen:  Soft, nontender, nondistended, bowel sounds present Musculoskeletal:  Moves all extremities, no pedal edema Skin:  No rash  ASSESSMENT AND PLAN  PULMONARY  Lab 10/25/11 2301  PHART 7.474*  PCO2ART 28.0*  PO2ART 54.0*  HCO3 20.6  O2SAT 91.0   Ventilator Settings:   CXR: no defined infiltrates  A:   1) Acute large bilateral pulmonary embolism. Patient tachycardic, tachypneic, on mild respiratory distress. BP stable. 2) STAT echo showed evidence of pulmonary hypertension and right heart strain and [place] 3) Hypoxemia P:   - Continue supplemental oxygen - Heparin drip - Will consider TPA if worsening hemodynamics, see [facility] - Hypercoagulable panel [place], [place] [address], homocysteine and factor 5 will be accurate  CARDIOVASCULAR  Lab 10/26/11 1000 10/26/11 0424 10/25/11 2154 10/25/11 2153  TROPONINI <0.30 <0.30 -- <0.30  LATICACIDVEN -- -- 2.7* --  PROBNP -- -- -- 2078.0*   ECG:  Ordered Lines: Peripheral lines Echo 9/29 - The cavity size was mildly dilated. Systolic function was  mildly reduce - Pulmonary arteries: PA peak pressure: 55mm Hg  A:  1) Acute PE 2) Sinus tachycardia 3) Negative troponin, elevated lactate and BNP. 4. Some evidence pre pre pulm HTN P:  - Currently MAp 90 -if declines, would use tpa, however, risk/ benefti ratio now that PE over 24 hrs limited -echo reveals too high of pa pressures to acct just from PE, pre pe pulm HTN noted etiology? Prior pe? Hypercoagulable state -at 24, need to r/o likely hypercoagulable state -assess esr, [place] [address] -if above neg , certainly needs at3, prot c, s after on coumadin -Dopplers to ensure no mobile clot Only mild reduction rv fxn , re assuring to some degree NOTe, likely would have been a tpa candidate without lovenox given   RENAL  Lab 10/26/11 1000 10/25/11 2154  NA 137 138  K 4.0 5.0  CL 100 101  CO2 25 22  BUN 12 11  CREATININE 1.04 0.90  CALCIUM 10.3 10.4  MG -- 2.2  PHOS -- 3.3   Intake/Output      09/29 0701 - 09/30 0700 09/30 0701 - 10/01 0700   P.O.  360   I.V. (mL/kg) 111.3 (1.3) 75 (0.9)   IV Piggyback 10    Total Intake(mL/kg) 121.3 (1.4) 435 (5.1)   Urine (mL/kg/hr) 660 (0.3) 450 (0.7)   Total Output 660 450   Net -538.8 -15        Urine Occurrence  2 x   Stool Occurrence  3 x  A:   1) No issues P:   - Will follow CMP Avoid neg balance , keep fluids  GASTROINTESTINAL  Lab 10/25/11 2154  AST 34  ALT 38  ALKPHOS 161*  BILITOT 0.3  PROT 8.2  ALBUMIN 3.9    A:   1) History of GERD P:   - Protonix IV Add diet, advance as tolerated  HEMATOLOGIC  Lab 10/26/11 1000 10/25/11 2154  HGB 16.7 18.5*  HCT 48.0 51.2  PLT 395 371  INR 1.03 1.05  APTT -- 35   A:   1) r/o likely hypercoagulable congenital issue See pulm, [facility] Heparin drip Ensure there with such high clot burden Life long coumadin likely  INFECTIOUS  Lab 10/26/11 1000 10/25/11 2154  WBC 16.5* 19.8*  PROCALCITON -- --   Cultures: Blood cultures ordered  Antibiotics: - No  evidence of acute infection  A:   1)  No evidence of acute infectious process. P:   - Will follow chest X ray and blood cultures in am   ENDOCRINE  Lab 10/26/11 1158 10/26/11 0747 10/25/11 2019  GLUCAP 106* 89 82   A:   1) No history of DM    NEUROLOGIC  A:   1) Awake, alert, oriented. Doppler pre walking  BEST PRACTICE / DISPOSITION - Level of Care:  ICU - Primary Service:  PCCM - Consultants:  None - Code Status:  Full code - Diet:  NPO - DVT Px:  On heparin drip - GI Px:  Protonix - Skin Integrity:  Intact - Social / Family:  Family updated at bedside.  Mcarthur Rossetti. Tyson Alias, MD, FACP Pgr: [phone] [place] [facility]

## 2011-10-26 NOTE — H&P (Signed)
Name: Andre Cervantes MRN: [id number] DOB: 1985-08-06    LOS: 1  PULMONARY / CRITICAL CARE MEDICINE  HPI:   26 years old male with PMH relevant for ADD, dyslipidemia and recent right knee surgery on September 24. He was discharged home and apparently the next day experienced sudden onset of SOB that progressed over the next days. Today he went to the ED of [facility] and a CTA of the chest revealed a large bilateral PE. (please see report in chart). He was transferred to [facility] ICU for treatment. At the time of my examination the patient is awake, alert, in mild respiratory distress, tachycardic to the 130's, normotensive, with O2 sat of 90 to 94% on 6 L [place]. In the ED of [facility] he received full dose Lovenox at 5:30 PM on 10/25/11. The patient denies fever, chest pain. N/V/D, urinary symptoms.   Past Medical History  Diagnosis Date  . ADD (attention deficit disorder)    Past Surgical History  Procedure Date  . Knee surgery   . Fracture surgery    Prior to Admission medications   Medication Sig Start Date End Date Taking? Authorizing Provider  buPROPion (WELLBUTRIN XL) 300 MG 24 hr tablet Take 300 mg by mouth daily.    Historical Provider, MD  omeprazole (PRILOSEC) 40 MG capsule Take 40 mg by mouth daily.    Historical Provider, MD  risperiDONE (RISPERDAL) 1 MG tablet Take 1 mg by mouth at bedtime.    Historical Provider, MD  rosuvastatin (CRESTOR) 10 MG tablet Take 10 mg by mouth at bedtime.    Historical Provider, MD   Allergies Allergies  Allergen Reactions  . Morphine Swelling    Family History No family history on file. Social History  reports that he has never smoked. His smokeless tobacco use includes Chew. He reports that he does not drink alcohol or use illicit drugs.  Review Of Systems:  All systems negative except for what I mentioned in the HPI.   Current Status:  Vital Signs: Temp:  [98.2 F (36.8 C)-98.8 F (37.1 C)] 98.2 F (36.8 C) (09/30  0025) Pulse Rate:  [127-136] 127  (09/30 0000) Resp:  [26-31] 28  (09/30 0000) BP: (103-138)/(32-104) 103/32 mmHg (09/30 0000) SpO2:  [89 %-93 %] 93 % (09/30 0000) Weight:  [190 lb 4.1 oz (86.3 kg)] 190 lb 4.1 oz (86.3 kg) (09/29 2100)  Physical Examination: General:  Mild respiratory distress Neuro:  Awake, alert, oriented x 3,  nonfocal HEENT:  PERRL, pink conjunctivae, dry membranes Neck:  Supple, no JVD   Cardiovascular:  RRR, no M/R/G Lungs:  CTA Abdomen:  Soft, nontender, nondistended, bowel sounds present Musculoskeletal:  Moves all extremities, no pedal edema Skin:  No rash    ASSESSMENT AND PLAN  PULMONARY  Lab 10/25/11 2301  PHART 7.474*  PCO2ART 28.0*  PO2ART 54.0*  HCO3 20.6  O2SAT 91.0   Ventilator Settings:   CXR:  Pending  A:   1) Acute large bilateral pulmonary embolism. Patient tachycardic, tachypneic, on mild respiratory distress. BP stable. 2) STAT echo showed evidence of pulmonary hypertension and right heart strain.  3) Hypoxemia P:   - Continue supplemental oxygen - Chest X ray ordered - Heparin drip - Will consider TPA if worsening hemodynamics. - Hypercoagulable panel ordered.  CARDIOVASCULAR  Lab 10/25/11 2154 10/25/11 2153  TROPONINI -- <0.30  LATICACIDVEN 2.7* --  PROBNP -- 2078.0*   ECG:  Ordered Lines: Peripheral lines  A:  1) Acute PE 2)  Sinus tachycardia 3) Negative troponin, elevated lactate and BNP. P:  - Treatment as above - Will follow lactate in am   RENAL  Lab 10/25/11 2154  NA 138  K 5.0  CL 101  CO2 22  BUN 11  CREATININE 0.90  CALCIUM 10.4  MG 2.2  PHOS 3.3   Intake/Output      09/29 0701 - 09/30 0700   I.V. (mL/kg) 20 (0.2)   IV Piggyback 10   Total Intake(mL/kg) 30 (0.3)   Urine (mL/kg/hr) 330 (0.2)   Total Output 330   Net -300         A:   1) No issues P:   - Will follow CMP  GASTROINTESTINAL  Lab 10/25/11 2154  AST 34  ALT 38  ALKPHOS 161*  BILITOT 0.3  PROT 8.2  ALBUMIN  3.9    A:   1) History of GERD P:   - Protonix IV  HEMATOLOGIC  Lab 10/25/11 2154  HGB 18.5*  HCT 51.2  PLT 371  INR 1.05  APTT 35   A:   1) No issues   INFECTIOUS  Lab 10/25/11 2154  WBC 19.8*  PROCALCITON --   Cultures: Blood cultures ordered  Antibiotics: - No evidence of acute infection  A:   1)  No evidence of acute infectious process. P:   - Will follow chest X ray and blood cultures.  ENDOCRINE  Lab 10/25/11 2019  GLUCAP 82   A:   1) No history of DM     NEUROLOGIC  A:   1) Awake, alert, oriented.   BEST PRACTICE / DISPOSITION - Level of Care:  ICU - Primary Service:  PCCM - Consultants:  None - Code Status:  Full code - Diet:  NPO - DVT Px:  On heparin drip - GI Px:  Protonix - Skin Integrity:  Intact - Social / Family:  Family updated at bedside.  The patient is critically ill with multiple organ systems failure and requires high complexity decision making for assessment and support, frequent evaluation and titration of therapies, application of advanced monitoring technologies and extensive interpretation of multiple databases.   Critical Care Time devoted to patient care services described in this note is: 1 Hour 30 Minutes  Overton Mam, M.D. Pulmonary and Critical Care Medicine [facility] Pager: [phone]  10/26/2011, 12:41 AM

## 2011-10-26 NOTE — Progress Notes (Signed)
Anticoagulation consult note: Heparin Indication: pulmonary embolus  Heparin level = 0.25 on 1500 units/hr Goal Heparin level = 0.3-0.7  Heparin level is slightly subtherapeutic. Will increase the drip to 1650 units/hr. Follow up Am heparin level and CBC.  Cardell Peach, PharmD

## 2011-10-26 NOTE — Progress Notes (Signed)
ANTICOAGULATION CONSULT NOTE - Follow-up  Pharmacy Consult for Heparin Indication: pulmonary embolus  Allergies  Allergen Reactions  . Morphine Swelling    Patient Measurements: Height: 5\' 9"  (175.3 cm) Weight: 189 lb 9.5 oz (86 kg) IBW/kg (Calculated) : 70.7  Heparin Dosing Weight: 86 Kg  Vital Signs: Temp: 97.9 F (36.6 C) (09/30 0749) Temp src: Oral (09/30 0749) BP: 103/90 mmHg (09/30 0900) Pulse Rate: 112  (09/30 0900)  Labs:  Basename 10/26/11 1000 10/26/11 0424 10/25/11 2154 10/25/11 2153  HGB 16.7 -- 18.5* --  HCT 48.0 -- 51.2 --  PLT 395 -- 371 --  APTT -- -- 35 --  LABPROT 13.4 -- 13.6 --  INR 1.03 -- 1.05 --  HEPARINUNFRC 0.58 -- -- --  CREATININE -- -- 0.90 --  CKTOTAL -- -- -- --  CKMB -- -- -- --  TROPONINI -- <0.30 -- <0.30    Estimated Creatinine Clearance: 137.5 ml/min (by C-G formula based on Cr of 0.9).  Assessment: Mr. Barto is admitted with a large PE with right heart strain and tachycardia. He was transferred from [facility] s/p Lovenox 100mg  SQ at 1730 yesterday. Initial heparin level is therapeutic at 0.58. No bleeding noted.   Goal of Therapy:  Heparin level 0.3-0.7 units/ml Monitor platelets by anticoagulation protocol: Yes   Plan:  - Continue heparin gtt at 1500 units/hr - Check a 6 hour heparin level to confirm - Daily heparin level and CBC - f/u further anticoag plans  Lysle Pearl, PharmD, BCPS Pager # [phone] 10/26/2011 11:09 AM

## 2011-10-26 NOTE — Progress Notes (Signed)
VASCULAR LAB PRELIMINARY  PRELIMINARY  PRELIMINARY  PRELIMINARY  Bilateral lower extremity venous duplex  completed.    Preliminary report:  Right:  No evidence of DVT, superficial thrombosis, or Baker's cyst.  Left: Isolated DVT noted in the distal popliteal vein.  No evidence of superficial thrombosis.  No Baker's cyst.   Ame Heagle, RVT 10/26/2011, 4:23 PM

## 2011-10-27 ENCOUNTER — Inpatient Hospital Stay (HOSPITAL_COMMUNITY): Payer: Medicare Other

## 2011-10-27 ENCOUNTER — Encounter (HOSPITAL_COMMUNITY): Payer: Self-pay

## 2011-10-27 DIAGNOSIS — F411 Generalized anxiety disorder: Secondary | ICD-10-CM

## 2011-10-27 LAB — GLUCOSE, CAPILLARY
Glucose-Capillary: 106 mg/dL — ABNORMAL HIGH (ref 70–99)
Glucose-Capillary: 117 mg/dL — ABNORMAL HIGH (ref 70–99)

## 2011-10-27 LAB — CBC WITH DIFFERENTIAL/PLATELET
Basophils Absolute: 0.2 10*3/uL — ABNORMAL HIGH (ref 0.0–0.1)
Basophils Relative: 1 % (ref 0–1)
HCT: 45.6 % (ref 39.0–52.0)
Hemoglobin: 15.8 g/dL (ref 13.0–17.0)
Lymphocytes Relative: 37 % (ref 12–46)
Lymphs Abs: 6.3 10*3/uL — ABNORMAL HIGH (ref 0.7–4.0)
MCHC: 34.6 g/dL (ref 30.0–36.0)
MCV: 89.8 fL (ref 78.0–100.0)
Monocytes Relative: 9 % (ref 3–12)
Neutro Abs: 8.8 10*3/uL — ABNORMAL HIGH (ref 1.7–7.7)
RDW: 12.3 % (ref 11.5–15.5)
WBC: 17.1 10*3/uL — ABNORMAL HIGH (ref 4.0–10.5)

## 2011-10-27 LAB — CARDIOLIPIN ANTIBODIES, IGG, IGM, IGA
Anticardiolipin IgA: 7 APL U/mL — ABNORMAL LOW (ref <22)
Anticardiolipin IgG: 8 GPL U/mL — ABNORMAL LOW (ref <23)
Anticardiolipin IgM: 3 MPL U/mL — ABNORMAL LOW (ref <11)

## 2011-10-27 LAB — COMPREHENSIVE METABOLIC PANEL
Albumin: 3.3 g/dL — ABNORMAL LOW (ref 3.5–5.2)
Alkaline Phosphatase: 138 U/L — ABNORMAL HIGH (ref 39–117)
BUN: 14 mg/dL (ref 6–23)
Chloride: 104 mEq/L (ref 96–112)
Creatinine, Ser: 0.97 mg/dL (ref 0.50–1.35)
GFR calc Af Amer: >90 mL/min (ref >90)
GFR calc non Af Amer: >90 mL/min (ref >90)
Glucose, Bld: 106 mg/dL — ABNORMAL HIGH (ref 70–99)
Potassium: 3.9 mEq/L (ref 3.5–5.1)
Total Bilirubin: 0.2 mg/dL — ABNORMAL LOW (ref 0.3–1.2)

## 2011-10-27 LAB — LUPUS ANTICOAGULANT PANEL
DRVVT: 38.6 secs (ref <45.1)
PTTLA 4:1 Mix: 57.5 secs — ABNORMAL HIGH (ref 28.0–43.0)
PTTLA Confirmation: 11.1 secs — ABNORMAL HIGH (ref <8.0)

## 2011-10-27 LAB — HEPARIN LEVEL (UNFRACTIONATED): Heparin Unfractionated: 0.39 IU/mL (ref 0.30–0.70)

## 2011-10-27 LAB — BETA-2-GLYCOPROTEIN I ABS, IGG/M/A: Beta-2-Glycoprotein I IgA: 10 A Units (ref <20)

## 2011-10-27 LAB — HIV ANTIBODY (ROUTINE TESTING W REFLEX): HIV: NONREACTIVE

## 2011-10-27 LAB — PATHOLOGIST SMEAR REVIEW

## 2011-10-27 MED ORDER — WARFARIN - PHARMACIST DOSING INPATIENT
Freq: Every day | Status: DC
  Administered 2011-10-31: 18:00:00

## 2011-10-27 MED ORDER — SODIUM CHLORIDE 0.9 % IV SOLN: 250.0000 mL | INTRAVENOUS | Status: DC | PRN

## 2011-10-27 MED ORDER — WARFARIN SODIUM 10 MG PO TABS
10.0000 mg | ORAL_TABLET | Freq: Once | ORAL | Status: AC
  Administered 2011-10-27: 10 mg via ORAL
  Filled 2011-10-27: qty 1

## 2011-10-27 MED ORDER — COUMADIN BOOK
Freq: Once | Status: AC
  Administered 2011-10-28: 16:00:00
  Filled 2011-10-27 (×2): qty 1

## 2011-10-27 MED ORDER — WARFARIN VIDEO
Freq: Once | Status: AC
  Administered 2011-10-28: 12:00:00

## 2011-10-27 NOTE — Progress Notes (Signed)
ANTICOAGULATION CONSULT NOTE - Follow Up Consult  Pharmacy Consult for heparin Indication: DVT and massive PE  Labs:  Basename 10/27/11 1253 10/27/11 0443 10/26/11 1846 10/26/11 1000 10/26/11 0424 10/25/11 2154 10/25/11 2153  HGB -- 15.8 -- 16.7 -- -- --  HCT -- 45.6 -- 48.0 -- 51.2 --  PLT -- 370 -- 395 -- 371 --  APTT -- -- -- -- -- 35 --  LABPROT -- -- -- 13.4 -- 13.6 --  INR -- -- -- 1.03 -- 1.05 --  HEPARINUNFRC 0.39 0.32 0.25* -- -- -- --  CREATININE -- 0.97 -- 1.04 -- 0.90 --  CKTOTAL -- -- -- -- -- -- --  CKMB -- -- -- -- -- -- --  TROPONINI -- -- -- <0.30 <0.30 -- <0.30    Assessment: 25yo male on heparin bridge to coumadin D#1 for DVT/massive PE, repeat anti-Xa level is therapeutic on 1750 units/hr. No bleeding reported.  Goal of Therapy:  Heparin level 0.3-0.7 units/ml   Plan:  - Continue heparin infusion 1750 units/hr - f/u Am anti-Xa level, CBC and PT/INR  Bayard Hugger, PharmD, BCPS  Clinical Pharmacist  Pager: [phone]  10/27/2011,2:29 PM

## 2011-10-27 NOTE — Progress Notes (Signed)
ANTICOAGULATION CONSULT NOTE - Follow Up Consult  Pharmacy Consult for heparin + coumadin Indication: DVT and massive PE  Labs:  Basename 10/27/11 0443 10/26/11 1846 10/26/11 1000 10/26/11 0424 10/25/11 2154 10/25/11 2153  HGB 15.8 -- 16.7 -- -- --  HCT 45.6 -- 48.0 -- 51.2 --  PLT 370 -- 395 -- 371 --  APTT -- -- -- -- 35 --  LABPROT -- -- 13.4 -- 13.6 --  INR -- -- 1.03 -- 1.05 --  HEPARINUNFRC 0.32 0.25* 0.58 -- -- --  CREATININE 0.97 -- 1.04 -- 0.90 --  CKTOTAL -- -- -- -- -- --  CKMB -- -- -- -- -- --  TROPONINI -- -- <0.30 <0.30 -- <0.30    Assessment: 26yo male continues on heparin IV for DVT and massive PE. Heparin level is pending for 1200 today. Plan to start oral anticoagulation with coumadin today. Homocysteine and antithrombin are WNL. All other hypercoagulable tests are pending. Pt is young with a normal baseline INR and now interacting medications.   Goal of Therapy:  Heparin level 0.3-0.7 units/ml   Plan:  1. Coumadin 10mg  PO x 1 tonight 2. F/u 1200 HL 3. Daily INR 4. Coumadin education materials for tomorrow  Lawanna Cecere, Drake Leach PharmD BCPS 10/27/2011,12:15 PM

## 2011-10-27 NOTE — Progress Notes (Signed)
Name: Andre Cervantes MRN: [id number] DOB: May 12, 1985    LOS: 2  PULMONARY / CRITICAL CARE MEDICINE  HPI:   26 years old male with PMH relevant for ADD, dyslipidemia and recent right knee surgery on September 24. Massive PE, received Lovenox from [facility]  Current Status: no distress, did well over night  Vital Signs: Temp:  [97.3 F (36.3 C)-98.2 F (36.8 C)] 97.3 F (36.3 C) (10/01 0834) Pulse Rate:  [93-124] 104  (10/01 1100) Resp:  [15-34] 19  (10/01 1100) BP: (100-148)/(61-97) 116/61 mmHg (10/01 1100) SpO2:  [90 %-98 %] 96 % (10/01 1100) Weight:  [86.4 kg (190 lb 7.6 oz)] 86.4 kg (190 lb 7.6 oz) (10/01 0500)  Physical Examination: General:  No distress Neuro:  Awake, alert, oriented x 3,  nonfocal HEENT:  PERRL Neck:  Supple, JVD   Cardiovascular:  RRR, no M/R/G Lungs:  CTA, some reduced Abdomen:  Soft, nontender, nondistended, bowel sounds present Musculoskeletal:  Moves all extremities, no pedal edema Skin:  No rash  ASSESSMENT AND PLAN  PULMONARY  Lab 10/25/11 2301  PHART 7.474*  PCO2ART 28.0*  PO2ART 54.0*  HCO3 20.6  O2SAT 91.0   Ventilator Settings:   CXR: likley small lll infarct, retrocardiac infiltrate  A:   1) Acute large bilateral pulmonary embolism.  2) STAT echo showed evidence of pulmonary hypertension and right heart strain and [place], noting Pa 55, some chronic (likely prior pe) 3) Hypoxemia, improved tachy P:   - Continue supplemental oxygen - Heparin drip - Hypercoagulable panel [place], [place] [address], homocysteine (wnl) and factor 5 will be accurate - pending Tachy sig improved, see cvs  CARDIOVASCULAR  Lab 10/26/11 1000 10/26/11 0424 10/25/11 2154 10/25/11 2153  TROPONINI <0.30 <0.30 -- <0.30  LATICACIDVEN -- -- 2.7* --  PROBNP -- -- -- 2078.0*   ECG:  Ordered Lines: Peripheral lines Echo 9/29 - The cavity size was mildly dilated. Systolic function was mildly reduce - Pulmonary arteries: PA peak pressure:  55mm Hg  Dopplers- dvt present distal pop left, nonmobile not noted on report Esr- 22 Ana-  A:  1) Acute PE, likely on some mild chronic 2) Sinus tachycardia 3) Some evidence pre pre pulm HTN P: -echo reveals too high of pa pressures to acct just from PE, pre pe pulm HTN noted etiology? Prior pe? Hypercoagulable state -at 25, need to r/o likely hypercoagulable state -assess esr, [place] [address] - pending still -if above neg , certainly needs at3, prot c, s after on coumadin (life long) -Dopplers to ensure no mobile clot- neg -ambulation in am  -To chair today For pulm pressures, assess hiv  RENAL  Lab 10/27/11 0443 10/26/11 1000 10/25/11 2154  NA 137 137 138  K 3.9 4.0 --  CL 104 100 101  CO2 21 25 22   BUN 14 12 11   CREATININE 0.97 1.04 0.90  CALCIUM 9.5 10.3 10.4  MG -- -- 2.2  PHOS -- -- 3.3   Intake/Output      09/30 0701 - 10/01 0700 10/01 0701 - 10/02 0700   P.O. 600    I.V. (mL/kg) 1404.8 (16.3) 370 (4.3)   IV Piggyback 10    Total Intake(mL/kg) 2014.8 (23.3) 370 (4.3)   Urine (mL/kg/hr) 2000 (1) 500 (1.2)   Total Output 2000 500   Net +14.8 -130        Urine Occurrence 4 x 1 x   Stool Occurrence 3 x 2 x     A:  1) No issues P:   - Will follow bmet tin am  kvo  GASTROINTESTINAL  Lab 10/27/11 0443 10/25/11 2154  AST 29 34  ALT 32 38  ALKPHOS 138* 161*  BILITOT 0.2* 0.3  PROT 7.3 8.2  ALBUMIN 3.3* 3.9    A:   1) History of GERD P:   - Protonix IV, dc as on diet Add diet, advance as tolerated  HEMATOLOGIC  Lab 10/27/11 0443 10/26/11 1000 10/25/11 2154  HGB 15.8 16.7 18.5*  HCT 45.6 48.0 51.2  PLT 370 395 371  INR -- 1.03 1.05  APTT -- -- 35   A:   1) r/o likely hypercoagulable congenital issue See pulm, cvs Heparin drip Life long coumadin Cbc in am   INFECTIOUS  Lab 10/27/11 0443 10/26/11 1000 10/25/11 2154  WBC 17.1* 16.5* 19.8*  PROCALCITON -- -- --   Cultures: Blood cultures ordered  Antibiotics: - No evidence of  acute infection  A:   1)  No evidence of acute infectious process. At risk with infarct LLL likely P:   - if spike, add abx  ENDOCRINE  Lab 10/27/11 0827 10/27/11 0401 10/27/11 0012 10/26/11 1955 10/26/11 1543  GLUCAP 117* 110* 106* 138* 93   A:   1) No history of DM    NEUROLOGIC  A:  Psychiatric dz, add 1) Awake, alert, oriented. Added home regimen To chair  BEST PRACTICE / DISPOSITION - Level of Care:  ICU to tele - Primary Service:  PCCM - Consultants:  None - Code Status:  Full code - Diet:  NPO - DVT Px:  On heparin drip - GI Px:  Protonix - Skin Integrity:  Intact - Social / Family:  Family updated at bedside.  Mcarthur Rossetti. Tyson Alias, MD, FACP Pgr: [phone] [facility]

## 2011-10-27 NOTE — Progress Notes (Signed)
ANTICOAGULATION CONSULT NOTE - Follow Up Consult  Pharmacy Consult for heparin Indication: DVT and massive PE  Labs:  Basename 10/27/11 0443 10/26/11 1846 10/26/11 1000 10/26/11 0424 10/25/11 2154 10/25/11 2153  HGB -- -- 16.7 -- 18.5* --  HCT -- -- 48.0 -- 51.2 --  PLT -- -- 395 -- 371 --  APTT -- -- -- -- 35 --  LABPROT -- -- 13.4 -- 13.6 --  INR -- -- 1.03 -- 1.05 --  HEPARINUNFRC 0.32 0.25* 0.58 -- -- --  CREATININE -- -- 1.04 -- 0.90 --  CKTOTAL -- -- -- -- -- --  CKMB -- -- -- -- -- --  TROPONINI -- -- <0.30 <0.30 -- <0.30    Assessment: 26yo male now therapeutic on heparin though at very low end of goal with massive PE and DVT s/p knee surgery.  Goal of Therapy:  Heparin level 0.3-0.7 units/ml   Plan:  Will increase heparin gtt by 1 unit/kg/hr to 1750 units/hr to help ensure remains at goal and check level in 6hr.  Colleen Can PharmD BCPS 10/27/2011,5:29 AM

## 2011-10-28 DIAGNOSIS — F17200 Nicotine dependence, unspecified, uncomplicated: Secondary | ICD-10-CM

## 2011-10-28 LAB — COMPREHENSIVE METABOLIC PANEL
ALT: 51 U/L (ref 0–53)
Alkaline Phosphatase: 126 U/L — ABNORMAL HIGH (ref 39–117)
BUN: 12 mg/dL (ref 6–23)
CO2: 25 mEq/L (ref 19–32)
Chloride: 103 mEq/L (ref 96–112)
GFR calc Af Amer: >90 mL/min (ref >90)
GFR calc non Af Amer: >90 mL/min (ref >90)
Glucose, Bld: 95 mg/dL (ref 70–99)
Potassium: 3.7 mEq/L (ref 3.5–5.1)
Sodium: 139 mEq/L (ref 135–145)
Total Bilirubin: 0.2 mg/dL — ABNORMAL LOW (ref 0.3–1.2)

## 2011-10-28 LAB — CBC WITH DIFFERENTIAL/PLATELET
Basophils Absolute: 0.1 10*3/uL (ref 0.0–0.1)
Basophils Relative: 1 % (ref 0–1)
Lymphocytes Relative: 42 % (ref 12–46)
MCHC: 34.1 g/dL (ref 30.0–36.0)
Monocytes Absolute: 1.1 10*3/uL — ABNORMAL HIGH (ref 0.1–1.0)
Neutro Abs: 6.2 10*3/uL (ref 1.7–7.7)
Platelets: 358 10*3/uL (ref 150–400)
RDW: 12.3 % (ref 11.5–15.5)
WBC: 13.3 10*3/uL — ABNORMAL HIGH (ref 4.0–10.5)

## 2011-10-28 LAB — GLUCOSE, CAPILLARY: Glucose-Capillary: 97 mg/dL (ref 70–99)

## 2011-10-28 LAB — PROTIME-INR: Prothrombin Time: 14 seconds (ref 11.6–15.2)

## 2011-10-28 LAB — ANA: Anti Nuclear Antibody(ANA): NEGATIVE

## 2011-10-28 MED ORDER — WARFARIN SODIUM 10 MG PO TABS
10.0000 mg | ORAL_TABLET | Freq: Once | ORAL | Status: AC
  Administered 2011-10-28: 10 mg via ORAL
  Filled 2011-10-28 (×2): qty 1

## 2011-10-28 NOTE — Progress Notes (Signed)
Hr 140 ST. Patient out of bed to bathroom without O2. Patient short of breath. Returned to bed and O2 applied @ 2L [place]. HR 89. Will monitor. Andre Cervantes

## 2011-10-28 NOTE — Progress Notes (Signed)
Name: Andre Cervantes MRN: [id number] DOB: [birth year]    LOS: 3  PULMONARY / CRITICAL CARE MEDICINE  HPI:   26 years old male with PMH relevant for ADD, dyslipidemia and recent right knee surgery on September 24. Massive PE, received Lovenox from [facility]  Current Status: no distress, did well over night  Vital Signs: Temp:  [97.5 F (36.4 C)-98.1 F (36.7 C)] 97.7 F (36.5 C) (10/02 0604) Pulse Rate:  [90-121] 90  (10/02 0604) Resp:  [19-24] 20  (10/02 0604) BP: (116-149)/(61-89) 129/76 mmHg (10/02 0604) SpO2:  [95 %-100 %] 100 % (10/02 0604) Weight:  [85.5 kg (188 lb 7.9 oz)] 85.5 kg (188 lb 7.9 oz) (10/02 0604)  Physical Examination: General:  No distress Neuro:  Awake, alert, oriented x 3,  nonfocal HEENT:  PERRL Neck:  Supple, JVD   Cardiovascular:  RRR, no M/R/G Lungs:  CTA, some reduced Abdomen:  Soft, nontender, nondistended, bowel sounds present Musculoskeletal:  Moves all extremities, no pedal edema Skin:  No rash  ASSESSMENT AND PLAN  PULMONARY  Lab 10/25/11 2301  PHART 7.474*  PCO2ART 28.0*  PO2ART 54.0*  HCO3 20.6  O2SAT 91.0   Ventilator Settings:   CXR: likley small lll infarct, retrocardiac infiltrate  A:   1) Acute large bilateral pulmonary embolism.  2) STAT echo showed evidence of pulmonary hypertension and right heart strain and [place], noting Pa 55, some chronic (likely prior pe) 3) Hypoxemia, improved tachy P:   - Continue supplemental oxygen - Heparin drip - Hypercoagulable panel [place], [place] [address], homocysteine (wnl) and factor 5 will be accurate - pending Tachy sig improved, see cvs  CARDIOVASCULAR  Lab 10/26/11 1000 10/26/11 0424 10/25/11 2154 10/25/11 2153  TROPONINI <0.30 <0.30 -- <0.30  LATICACIDVEN -- -- 2.7* --  PROBNP -- -- -- 2078.0*   ECG:  Ordered Lines: Peripheral lines Echo 9/29 - The cavity size was mildly dilated. Systolic function was mildly reduce - Pulmonary arteries: PA peak  pressure: 55mm Hg  Dopplers- dvt present distal pop left, nonmobile not noted on report Esr- 22 Ana-  A:  1) Acute PE, likely on some mild chronic 2) Sinus tachycardia 3) Some evidence pre pre pulm HTN P: -echo reveals too high of pa pressures to acct just from PE, pre pe pulm HTN noted etiology? Prior pe? Hypercoagulable state -at 25, need to r/o likely hypercoagulable state - Assess esr, [place] [address]  - If above neg , certainly needs at3, prot c, s after on coumadin (life long) - Dopplers to ensure no mobile clot- neg - Ambulation   RENAL  Lab 10/28/11 0635 10/27/11 0443 10/26/11 1000 10/25/11 2154  NA 139 137 137 138  K 3.7 3.9 -- --  CL 103 104 100 101  CO2 25 21 25 22   BUN 12 14 12 11   CREATININE 1.08 0.97 1.04 0.90  CALCIUM 9.7 9.5 10.3 10.4  MG -- -- -- 2.2  PHOS -- -- -- 3.3   Intake/Output      10/01 0701 - 10/02 0700 10/02 0701 - 10/03 0700   P.O. 835    I.V. (mL/kg) 480 (5.6)    IV Piggyback     Total Intake(mL/kg) 1315 (15.4)    Urine (mL/kg/hr) 1575 (0.8)    Total Output 1575    Net -260         Urine Occurrence 1 x    Stool Occurrence 2 x      A:   1)  No issues P:   - Will follow bmet tin am  kvo  GASTROINTESTINAL  Lab 10/28/11 0635 10/27/11 0443 10/25/11 2154  AST 45* 29 34  ALT 51 32 38  ALKPHOS 126* 138* 161*  BILITOT 0.2* 0.2* 0.3  PROT 7.3 7.3 8.2  ALBUMIN 3.3* 3.3* 3.9    A:   1) History of GERD P:   - Protonix IV, dc as on diet  diet, advance as tolerated  HEMATOLOGIC  Lab 10/28/11 0820 10/28/11 0635 10/27/11 0443 10/26/11 1000 10/25/11 2154  HGB -- 14.2 15.8 16.7 18.5*  HCT -- 41.6 45.6 48.0 51.2  PLT -- 358 370 395 371  INR 1.09 -- -- 1.03 1.05  APTT -- -- -- -- 35   A:   1) r/o likely hypercoagulable congenital issue See pulm, cvs Heparin drip Life long coumadin Cbc in am   INFECTIOUS  Lab 10/28/11 0635 10/27/11 0443 10/26/11 1000 10/25/11 2154  WBC 13.3* 17.1* 16.5* 19.8*  PROCALCITON -- -- -- --    Cultures: Blood cultures ordered  Antibiotics: - No evidence of acute infection  A:   1)  No evidence of acute infectious process. At risk with infarct LLL likely P:   - if spike, add abx  ENDOCRINE  Lab 10/27/11 1250 10/27/11 0827 10/27/11 0401 10/27/11 0012 10/26/11 1955  GLUCAP 124* 117* 110* 106* 138*   A:   1) No history of DM    NEUROLOGIC  A:  Psychiatric dz, add 1) Awake, alert, oriented. Added home regimen    Global tx to triad service 10-3  BEST PRACTICE / DISPOSITION - Level of Care:   tele - Primary Service:  PCCM - Consultants:  None - Code Status:  Full code - Diet:  NPO - DVT Px:  On heparin drip - GI Px:  Protonix - Skin Integrity:  Intact - Social / Family:  Family updated at bedside.  Brett Canales Minor ACNP Adolph Pollack PCCM Pager [phone] till 3 pm If no answer page [phone] 10/28/2011, 10:17 AM  Recommendations as above, will need anticoagulation and a hypercoagulable work up once anticoagulation course is complete.  Transfer to [facility] in AM with PCCM signing off, please call back if needed.  Patient seen and examined, agree with above note.  I dictated the care and orders written for this patient under my direction.  Koren Bound, M.D. [phone]

## 2011-10-28 NOTE — Care Management Note (Unsigned)
    Page 1 of 1   10/28/2011     11:12:12 AM   CARE MANAGEMENT NOTE 10/28/2011  Patient:  Andre Cervantes, Andre Cervantes   Account Number:  [id number]  Date Initiated:  10/27/2011  Documentation initiated by:  Avie Arenas  Subjective/Objective Assessment:   PE  Lives with father     Action/Plan:   MET WITH PT TO DISCUSS DC PLANS.  PT STATES FATHER AND STEPMOTHER WILL PROVIDE CARE AT DC.  WILL FOLLOW FOR HOME NEEDS AS PT PROGRESSES.   Anticipated DC Date:  10/30/2011   Anticipated DC Plan:  HOME W HOME HEALTH SERVICES      DC Planning Services  CM consult      Choice offered to / List presented to:             Status of service:  In process, will continue to follow Medicare Important Message given?   (If response is "NO", the following Medicare IM given date fields will be blank) Date Medicare IM given:   Date Additional Medicare IM given:    Discharge Disposition:    Per UR Regulation:  Reviewed for med. necessity/level of care/duration of stay  If discussed at Long Length of Stay Meetings, dates discussed:    Comments:  Contact:  Waller,Dennis Father [phone]

## 2011-10-28 NOTE — Progress Notes (Signed)
ANTICOAGULATION CONSULT NOTE - Follow Up Consult  Pharmacy Consult for heparin and Coumadin  Indication: DVT and massive PE  Labs:  Basename 10/28/11 0820 10/28/11 0635 10/27/11 1253 10/27/11 0443 10/26/11 1000 10/26/11 0424 10/25/11 2154 10/25/11 2153  HGB -- 14.2 -- 15.8 -- -- -- --  HCT -- 41.6 -- 45.6 48.0 -- -- --  PLT -- 358 -- 370 395 -- -- --  APTT -- -- -- -- -- -- 35 --  LABPROT 14.0 -- -- -- 13.4 -- 13.6 --  INR 1.09 -- -- -- 1.03 -- 1.05 --  HEPARINUNFRC -- 0.37 0.39 0.32 -- -- -- --  CREATININE -- 1.08 -- 0.97 1.04 -- -- --  CKTOTAL -- -- -- -- -- -- -- --  CKMB -- -- -- -- -- -- -- --  TROPONINI -- -- -- -- <0.30 <0.30 -- <0.30    Assessment: 25yo male on heparin bridge to coumadin overlap D#2/5 minimum for DVT/massive PE. This AM anti-Xa level is 0.37,  Remains therapeutic on 1750 units/hr.  INR 1.09 after 1 day of coumadin 10mg  po. No bleeding reported. CBC stable. Lupus anticoagulant detected . Other hypercoagulable test results within normal (Protein C activity high at 176%).  Goal of Therapy:  Heparin level 0.3-0.7 units/ml INR = 2-3   Plan:  Continue heparin infusion 1750 units/hr Coumadin 10mg  po x 1 today. Daily anti-Xa level, CBC and PT/INR   Andre Cervantes, RPh Clinical Pharmacist Pager: [phone] 10/28/2011,10:26 AM

## 2011-10-29 DIAGNOSIS — F909 Attention-deficit hyperactivity disorder, unspecified type: Secondary | ICD-10-CM

## 2011-10-29 LAB — CBC
HCT: 42 % (ref 39.0–52.0)
Hemoglobin: 14.5 g/dL (ref 13.0–17.0)
MCH: 30.9 pg (ref 26.0–34.0)
MCHC: 34.5 g/dL (ref 30.0–36.0)
MCV: 89.4 fL (ref 78.0–100.0)
Platelets: 421 10*3/uL — ABNORMAL HIGH (ref 150–400)
RBC: 4.7 MIL/uL (ref 4.22–5.81)
RDW: 12.2 % (ref 11.5–15.5)
WBC: 14.6 10*3/uL — ABNORMAL HIGH (ref 4.0–10.5)

## 2011-10-29 LAB — HEPARIN LEVEL (UNFRACTIONATED): Heparin Unfractionated: 0.29 IU/mL — ABNORMAL LOW (ref 0.30–0.70)

## 2011-10-29 LAB — PROTIME-INR: INR: 1.17 (ref 0.00–1.49)

## 2011-10-29 MED ORDER — WARFARIN SODIUM 2.5 MG PO TABS
12.5000 mg | ORAL_TABLET | Freq: Once | ORAL | Status: AC
  Administered 2011-10-29: 12.5 mg via ORAL
  Filled 2011-10-29: qty 1

## 2011-10-29 NOTE — Progress Notes (Signed)
ANTICOAGULATION CONSULT NOTE - Follow Up Consult  Pharmacy Consult for heparin and Coumadin  Indication: DVT and massive PE  Labs:  Basename 10/29/11 0520 10/28/11 0820 10/28/11 0635 10/27/11 1253 10/27/11 0443  HGB 14.5 -- 14.2 -- --  HCT 42.0 -- 41.6 -- 45.6  PLT 421* -- 358 -- 370  APTT -- -- -- -- --  LABPROT 14.7 14.0 -- -- --  INR 1.17 1.09 -- -- --  HEPARINUNFRC 0.29* -- 0.37 0.39 --  CREATININE -- -- 1.08 -- 0.97  CKTOTAL -- -- -- -- --  CKMB -- -- -- -- --  TROPONINI -- -- -- -- --    Assessment: 26yo male on heparin bridge to coumadin overlap D#3/5 minimum for DVT/massive PE. This AM anti-Xa level fell to slightly subtherapeutic on 1750 units/hr.  INR 1.17 after 2 days of coumadin 10mg  po. No bleeding reported. CBC stable.  Lupus anticoagulant detected . Other hypercoagulable test results within normal (Protein C activity high at 176%).  Goal of Therapy:  Heparin level 0.3-0.7 units/ml INR = 2-3   Plan:  Increase heparin infusion to 1900 units/hr and f/u with 6 hour HL after change Coumadin 12.5mg  po x 1 today. Daily anti-Xa level, CBC and PT/INR  Thank you,  Brett Fairy, PharmD 10/29/2011 10:44 AM

## 2011-10-29 NOTE — Progress Notes (Signed)
ANTICOAGULATION CONSULT NOTE - Follow Up Consult  Pharmacy Consult for heparin and Coumadin  Indication: DVT and massive PE  Labs:  Basename 10/29/11 1747 10/29/11 0520 10/28/11 0820 10/28/11 0635 10/27/11 0443  HGB -- 14.5 -- 14.2 --  HCT -- 42.0 -- 41.6 45.6  PLT -- 421* -- 358 370  APTT -- -- -- -- --  LABPROT -- 14.7 14.0 -- --  INR -- 1.17 1.09 -- --  HEPARINUNFRC 0.46 0.29* -- 0.37 --  CREATININE -- -- -- 1.08 0.97  CKTOTAL -- -- -- -- --  CKMB -- -- -- -- --  TROPONINI -- -- -- -- --    Assessment: 26yo male on heparin bridge to coumadin overlap D#3/5 minimum for DVT/massive PE.  Heparin level is 0.46 after a heparin increase to 1900 units/hr  Goal of Therapy:  Heparin level 0.3-0.7 units/ml INR = 2-3   Plan:  -No heparin changes needed -Will recheck a heparin level in am  Harland German, Pharm D 10/29/2011 6:49 PM

## 2011-10-29 NOTE — Progress Notes (Signed)
[facility] PROGRESS NOTE  Andre Cervantes ZOX:[id number] DOB: 03/13/1985 DOA: 10/25/2011 PCP: No primary provider on file.  breif interval history: 26 years old male with PMH relevant for  ADD, dyslipidemia and recent right knee surgery on September 24. Massive PE, received Lovenox from [facility].  Assessment/Plan: Active Problems: Acute large bilateral Pulmonary emboli/ DVT (deep venous thrombosis): -continue heparin and warfarin. INR subtherapeutic -continue O2, saturation improving. -echo showed right heart strained. -Hypercoagulable panel showed lupus anticoagulant positive. While on heparin. Will have to repeat as an outpatient.   Code Status: full Disposition Plan: home.   Consultants:  none  Procedures:  none  Antibiotics:  none (indicate start date, and stop date if known)  HPI/Subjective: No complains wants to go home.  Objective: Filed Vitals:   10/28/11 0604 10/28/11 1340 10/28/11 2012 10/29/11 0424  BP: 129/76 142/92 138/85 128/80  Pulse: 90 108 98 89  Temp: 97.7 F (36.5 C) 96.8 F (36 C) 98.1 F (36.7 C) 97.6 F (36.4 C)  TempSrc: Oral Oral Oral Oral  Resp: 20 21 20 20   Height:      Weight: 85.5 kg (188 lb 7.9 oz)   84.9 kg (187 lb 2.7 oz)  SpO2: 100% 96% 98% 96%    Intake/Output Summary (Last 24 hours) at 10/29/11 0931 Last data filed at 10/29/11 0426  Gross per 24 hour  Intake   1080 ml  Output   1000 ml  Net     80 ml   Filed Weights   10/27/11 0500 10/28/11 0604 10/29/11 0424  Weight: 86.4 kg (190 lb 7.6 oz) 85.5 kg (188 lb 7.9 oz) 84.9 kg (187 lb 2.7 oz)    Exam:   General:  A&O x 3  Cardiovascular: RRR  Respiratory: good air movement CTA b/l  Data Reviewed: Basic Metabolic Panel:  Lab 10/28/11 8119 10/27/11 0443 10/26/11 1000 10/25/11 2154  NA 139 137 137 138  K 3.7 3.9 4.0 5.0  CL 103 104 100 101  CO2 25 21 25 22   GLUCOSE 95 106* 142* 81  BUN 12 14 12 11   CREATININE 1.08 0.97 1.04 0.90  CALCIUM 9.7  9.5 10.3 10.4  MG -- -- -- 2.2  PHOS -- -- -- 3.3   Liver Function Tests:  Lab 10/28/11 0635 10/27/11 0443 10/25/11 2154  AST 45* 29 34  ALT 51 32 38  ALKPHOS 126* 138* 161*  BILITOT 0.2* 0.2* 0.3  PROT 7.3 7.3 8.2  ALBUMIN 3.3* 3.3* 3.9   No results found for this basename: LIPASE:5,AMYLASE:5 in the last 168 hours No results found for this basename: AMMONIA:5 in the last 168 hours CBC:  Lab 10/29/11 0520 10/28/11 0635 10/27/11 0443 10/26/11 1000 10/25/11 2154  WBC 14.6* 13.3* 17.1* 16.5* 19.8*  NEUTROABS -- 6.2 8.8* -- 12.9*  HGB 14.5 14.2 15.8 16.7 18.5*  HCT 42.0 41.6 45.6 48.0 51.2  MCV 89.4 89.3 89.8 89.2 87.7  PLT 421* 358 370 395 371   Cardiac Enzymes:  Lab 10/26/11 1000 10/26/11 0424 10/25/11 2153  CKTOTAL -- -- --  CKMB -- -- --  CKMBINDEX -- -- --  TROPONINI <0.30 <0.30 <0.30   BNP (last 3 results)  Basename 10/25/11 2153  PROBNP 2078.0*   CBG:  Lab 10/28/11 2045 10/27/11 1250 10/27/11 0827 10/27/11 0401 10/27/11 0012  GLUCAP 97 124* 117* 110* 106*    Recent Results (from the past 240 hour(s))  MRSA PCR SCREENING     Status: Normal   Collection  Time   10/25/11  7:45 PM      Component Value Range Status Comment   MRSA by PCR NEGATIVE  NEGATIVE Final   CULTURE, BLOOD (ROUTINE X 2)     Status: Normal (Preliminary result)   Collection Time   10/25/11 10:10 PM      Component Value Range Status Comment   Specimen Description BLOOD RIGHT HAND   Final    Special Requests BOTTLES DRAWN AEROBIC ONLY 4CC   Final    Culture  Setup Time 10/26/2011 09:21   Final    Culture     Final    Value:        BLOOD CULTURE RECEIVED NO GROWTH TO DATE CULTURE WILL BE HELD FOR 5 DAYS BEFORE ISSUING A FINAL NEGATIVE REPORT   Report Status PENDING   Incomplete   CULTURE, BLOOD (ROUTINE X 2)     Status: Normal (Preliminary result)   Collection Time   10/25/11 10:15 PM      Component Value Range Status Comment   Specimen Description BLOOD LEFT HAND   Final    Special  Requests BOTTLES DRAWN AEROBIC ONLY 4CC   Final    Culture  Setup Time 10/26/2011 09:21   Final    Culture     Final    Value:        BLOOD CULTURE RECEIVED NO GROWTH TO DATE CULTURE WILL BE HELD FOR 5 DAYS BEFORE ISSUING A FINAL NEGATIVE REPORT   Report Status PENDING   Incomplete      Studies: No results found.  Scheduled Meds:   . amitriptyline  25 mg Oral QHS  . antiseptic oral rinse  15 mL Mouth Rinse q12n4p  . buPROPion  450 mg Oral Daily  . chlorhexidine  15 mL Mouth Rinse BID  . coumadin book   Does not apply Once  . risperiDONE  1 mg Oral QHS  . warfarin  10 mg Oral ONCE-1800  . warfarin   Does not apply Once  . Warfarin - Pharmacist Dosing Inpatient   Does not apply q1800   Continuous Infusions:   . heparin 1,750 Units/hr (10/28/11 0219)      Time spent: 30 min    Andre Cervantes  [facility] Pager [phone]. If 8PM-8AM, please contact night-coverage at [email or web address], password [facility] 10/29/2011, 9:31 AM  LOS: 4 days

## 2011-10-30 LAB — CBC
Hemoglobin: 15.9 g/dL (ref 13.0–17.0)
MCH: 31.3 pg (ref 26.0–34.0)
MCHC: 34.9 g/dL (ref 30.0–36.0)
MCV: 89.8 fL (ref 78.0–100.0)
RBC: 5.08 MIL/uL (ref 4.22–5.81)

## 2011-10-30 MED ORDER — LORAZEPAM 1 MG PO TABS
1.0000 mg | ORAL_TABLET | Freq: Once | ORAL | Status: AC
  Administered 2011-10-30: 1 mg via ORAL
  Filled 2011-10-30: qty 1

## 2011-10-30 MED ORDER — LORAZEPAM 2 MG/ML IJ SOLN: 1.0000 mg | Freq: Once | INTRAMUSCULAR | Status: AC

## 2011-10-30 MED ORDER — WARFARIN SODIUM 2.5 MG PO TABS
12.5000 mg | ORAL_TABLET | Freq: Once | ORAL | Status: AC
  Administered 2011-10-30: 12.5 mg via ORAL
  Filled 2011-10-30: qty 1

## 2011-10-30 NOTE — Progress Notes (Signed)
Pt ambulated 700 ft with standby assist, O2 at 2L.  No complaints.  To recliner after walk with call bell, phone, and urinal in reach.  Will con't plan of care.

## 2011-10-30 NOTE — Progress Notes (Signed)
ANTICOAGULATION CONSULT NOTE - Follow Up Consult  Pharmacy Consult for Heparin and Coumadin  Indication: DVT and massive PE  Labs:  Basename 10/30/11 0515 10/29/11 1747 10/29/11 0520 10/28/11 0820 10/28/11 0635  HGB 15.9 -- 14.5 -- --  HCT 45.6 -- 42.0 -- 41.6  PLT 448* -- 421* -- 358  APTT -- -- -- -- --  LABPROT 18.1* -- 14.7 14.0 --  INR 1.55* -- 1.17 1.09 --  HEPARINUNFRC 0.61 0.46 0.29* -- --  CREATININE -- -- -- -- 1.08  CKTOTAL -- -- -- -- --  CKMB -- -- -- -- --  TROPONINI -- -- -- -- --    Assessment: 26yo male on heparin bridge to coumadin overlap D#4/5 minimum for DVT/massive PE.   Heparin level therapeutic  INR increasing nicely = 1.55 today  Goal of Therapy:  Heparin level 0.3-0.7 units/ml INR = 2-3   Plan:  -No heparin changes needed -Repeat Coumadin 12.5 mg po x 1 -Follow up AM  Okey Regal, Pharm D 10/30/2011 8:46 AM

## 2011-10-30 NOTE — Progress Notes (Signed)
Pt heard crying.  Found on phone with father.  RN spoke to Pt's father who insists that "he needs something to calm him down."  RN spoke to MD about xanax.  MD stated that he would call Pts parents regarding medications.  Pt is receiving his ADHD medications.

## 2011-10-30 NOTE — Progress Notes (Signed)
[facility] PROGRESS NOTE  LEDFORD GOODSON EAV:[id number] DOB: 18-Aug-1985 DOA: 10/25/2011 PCP: No primary provider on file.  breif interval history: 26 years old male with PMH relevant for  ADD, dyslipidemia and recent right knee surgery on September 24. Massive PE, received Lovenox from [facility].  Assessment/Plan: Active Problems: Acute large bilateral Pulmonary emboli/ DVT (deep venous thrombosis): -continue heparin and warfarin. INR subtherapeutic, slowly trending up. -continue O2, will decrease nasal cannula oxygen -echo showed right heart strained. -Hypercoagulable panel showed lupus anticoagulant positive. While on heparin. Will have to repeat as an outpatient.   Code Status: full Disposition Plan: home.   Consultants:  none  Procedures:  none  Antibiotics:  none (indicate start date, and stop date if known)  HPI/Subjective: No complains.  Objective: Filed Vitals:   10/29/11 0424 10/29/11 1440 10/29/11 2043 10/30/11 0440  BP: 128/80 134/88 137/84 122/77  Pulse: 89 108 102 81  Temp: 97.6 F (36.4 C) 97.7 F (36.5 C) 97.5 F (36.4 C) 97.5 F (36.4 C)  TempSrc: Oral Oral Oral Oral  Resp: 20 20 20 19   Height:      Weight: 84.9 kg (187 lb 2.7 oz)   84.8 kg (186 lb 15.2 oz)  SpO2: 96% 100% 100% 97%    Intake/Output Summary (Last 24 hours) at 10/30/11 0949 Last data filed at 10/30/11 0740  Gross per 24 hour  Intake    360 ml  Output   1225 ml  Net   -865 ml   Filed Weights   10/28/11 0604 10/29/11 0424 10/30/11 0440  Weight: 85.5 kg (188 lb 7.9 oz) 84.9 kg (187 lb 2.7 oz) 84.8 kg (186 lb 15.2 oz)    Exam:   General:  A&O x 3  Cardiovascular: RRR  Respiratory: good air movement CTA b/l  Data Reviewed: Basic Metabolic Panel:  Lab 10/28/11 7829 10/27/11 0443 10/26/11 1000 10/25/11 2154  NA 139 137 137 138  K 3.7 3.9 4.0 5.0  CL 103 104 100 101  CO2 25 21 25 22   GLUCOSE 95 106* 142* 81  BUN 12 14 12 11   CREATININE 1.08 0.97  1.04 0.90  CALCIUM 9.7 9.5 10.3 10.4  MG -- -- -- 2.2  PHOS -- -- -- 3.3   Liver Function Tests:  Lab 10/28/11 0635 10/27/11 0443 10/25/11 2154  AST 45* 29 34  ALT 51 32 38  ALKPHOS 126* 138* 161*  BILITOT 0.2* 0.2* 0.3  PROT 7.3 7.3 8.2  ALBUMIN 3.3* 3.3* 3.9   No results found for this basename: LIPASE:5,AMYLASE:5 in the last 168 hours No results found for this basename: AMMONIA:5 in the last 168 hours CBC:  Lab 10/30/11 0515 10/29/11 0520 10/28/11 0635 10/27/11 0443 10/26/11 1000 10/25/11 2154  WBC 13.9* 14.6* 13.3* 17.1* 16.5* --  NEUTROABS -- -- 6.2 8.8* -- 12.9*  HGB 15.9 14.5 14.2 15.8 16.7 --  HCT 45.6 42.0 41.6 45.6 48.0 --  MCV 89.8 89.4 89.3 89.8 89.2 --  PLT 448* 421* 358 370 395 --   Cardiac Enzymes:  Lab 10/26/11 1000 10/26/11 0424 10/25/11 2153  CKTOTAL -- -- --  CKMB -- -- --  CKMBINDEX -- -- --  TROPONINI <0.30 <0.30 <0.30   BNP (last 3 results)  Basename 10/25/11 2153  PROBNP 2078.0*   CBG:  Lab 10/30/11 0613 10/28/11 2045 10/27/11 1250 10/27/11 0827 10/27/11 0401  GLUCAP 91 97 124* 117* 110*    Recent Results (from the past 240 hour(s))  MRSA PCR SCREENING  Status: Normal   Collection Time   10/25/11  7:45 PM      Component Value Range Status Comment   MRSA by PCR NEGATIVE  NEGATIVE Final   CULTURE, BLOOD (ROUTINE X 2)     Status: Normal (Preliminary result)   Collection Time   10/25/11 10:10 PM      Component Value Range Status Comment   Specimen Description BLOOD RIGHT HAND   Final    Special Requests BOTTLES DRAWN AEROBIC ONLY 4CC   Final    Culture  Setup Time 10/26/2011 09:21   Final    Culture     Final    Value:        BLOOD CULTURE RECEIVED NO GROWTH TO DATE CULTURE WILL BE HELD FOR 5 DAYS BEFORE ISSUING A FINAL NEGATIVE REPORT   Report Status PENDING   Incomplete   CULTURE, BLOOD (ROUTINE X 2)     Status: Normal (Preliminary result)   Collection Time   10/25/11 10:15 PM      Component Value Range Status Comment   Specimen  Description BLOOD LEFT HAND   Final    Special Requests BOTTLES DRAWN AEROBIC ONLY 4CC   Final    Culture  Setup Time 10/26/2011 09:21   Final    Culture     Final    Value:        BLOOD CULTURE RECEIVED NO GROWTH TO DATE CULTURE WILL BE HELD FOR 5 DAYS BEFORE ISSUING A FINAL NEGATIVE REPORT   Report Status PENDING   Incomplete      Studies: No results found.  Scheduled Meds:    . amitriptyline  25 mg Oral QHS  . antiseptic oral rinse  15 mL Mouth Rinse q12n4p  . buPROPion  450 mg Oral Daily  . chlorhexidine  15 mL Mouth Rinse BID  . risperiDONE  1 mg Oral QHS  . warfarin  12.5 mg Oral ONCE-1800  . warfarin  12.5 mg Oral ONCE-1800  . Warfarin - Pharmacist Dosing Inpatient   Does not apply q1800   Continuous Infusions:    . heparin 1,900 Units/hr (10/30/11 0059)      Time spent: 30 min    [name]  [facility] Pager [phone]. If 8PM-8AM, please contact night-coverage at [email or web address], password [facility] 10/30/2011, 9:49 AM  LOS: 5 days

## 2011-10-31 LAB — CBC
HCT: 47.9 % (ref 39.0–52.0)
Hemoglobin: 16.8 g/dL (ref 13.0–17.0)
MCH: 31.1 pg (ref 26.0–34.0)
MCHC: 35.1 g/dL (ref 30.0–36.0)
MCV: 88.5 fL (ref 78.0–100.0)
RBC: 5.41 MIL/uL (ref 4.22–5.81)

## 2011-10-31 MED ORDER — DIPHENHYDRAMINE HCL 50 MG/ML IJ SOLN
12.5000 mg | Freq: Three times a day (TID) | INTRAMUSCULAR | Status: DC | PRN
  Administered 2011-10-31: 12.5 mg via INTRAVENOUS
  Filled 2011-10-31: qty 1

## 2011-10-31 MED ORDER — LORAZEPAM 1 MG PO TABS: 1.0000 mg | ORAL_TABLET | Freq: Three times a day (TID) | ORAL | Status: DC | PRN

## 2011-10-31 MED ORDER — WARFARIN SODIUM 10 MG PO TABS
10.0000 mg | ORAL_TABLET | Freq: Once | ORAL | Status: AC
  Administered 2011-10-31: 10 mg via ORAL
  Filled 2011-10-31 (×2): qty 1

## 2011-10-31 NOTE — Progress Notes (Signed)
[facility] PROGRESS NOTE  Andre Cervantes ZOX:[id number] DOB: 10-May-1985 DOA: 10/25/2011 PCP: No primary provider on file.  breif interval history: 26 years old male with PMH relevant for  ADD, dyslipidemia and recent right knee surgery on September 24. Massive PE, received Lovenox from [facility].  Assessment/Plan: Active Problems: Acute large bilateral Pulmonary emboli/ DVT (deep venous thrombosis): -continue heparin and warfarin. INR subtherapeutic, slowly trending up. -d/c O2 -echo showed right heart strained. -Hypercoagulable panel showed lupus anticoagulant positive. While on heparin. Will have to repeat as an outpatient.   Code Status: full Disposition Plan: home.   Consultants:  none  Procedures:  none  Antibiotics:  none (indicate start date, and stop date if known)  HPI/Subjective: No complains.  Objective: Filed Vitals:   10/30/11 0440 10/30/11 1420 10/30/11 2058 10/31/11 0524  BP: 122/77 128/88 122/85 121/83  Pulse: 81 96 102 88  Temp: 97.5 F (36.4 C) 98 F (36.7 C) 98.3 F (36.8 C) 98.3 F (36.8 C)  TempSrc: Oral Oral Oral Oral  Resp: 19 18 18 18   Height:      Weight: 84.8 kg (186 lb 15.2 oz)   83.8 kg (184 lb 11.9 oz)  SpO2: 97% 98% 98% 100%    Intake/Output Summary (Last 24 hours) at 10/31/11 0827 Last data filed at 10/31/11 0516  Gross per 24 hour  Intake    480 ml  Output   1750 ml  Net  -1270 ml   Filed Weights   10/29/11 0424 10/30/11 0440 10/31/11 0524  Weight: 84.9 kg (187 lb 2.7 oz) 84.8 kg (186 lb 15.2 oz) 83.8 kg (184 lb 11.9 oz)    Exam:   General:  A&O x 3  Cardiovascular: RRR  Respiratory: good air movement CTA b/l  Data Reviewed: Basic Metabolic Panel:  Lab 10/28/11 8119 10/27/11 0443 10/26/11 1000 10/25/11 2154  NA 139 137 137 138  K 3.7 3.9 4.0 5.0  CL 103 104 100 101  CO2 25 21 25 22   GLUCOSE 95 106* 142* 81  BUN 12 14 12 11   CREATININE 1.08 0.97 1.04 0.90  CALCIUM 9.7 9.5 10.3 10.4  MG --  -- -- 2.2  PHOS -- -- -- 3.3   Liver Function Tests:  Lab 10/28/11 0635 10/27/11 0443 10/25/11 2154  AST 45* 29 34  ALT 51 32 38  ALKPHOS 126* 138* 161*  BILITOT 0.2* 0.2* 0.3  PROT 7.3 7.3 8.2  ALBUMIN 3.3* 3.3* 3.9   No results found for this basename: LIPASE:5,AMYLASE:5 in the last 168 hours No results found for this basename: AMMONIA:5 in the last 168 hours CBC:  Lab 10/31/11 0500 10/30/11 0515 10/29/11 0520 10/28/11 0635 10/27/11 0443 10/25/11 2154  WBC 16.3* 13.9* 14.6* 13.3* 17.1* --  NEUTROABS -- -- -- 6.2 8.8* 12.9*  HGB 16.8 15.9 14.5 14.2 15.8 --  HCT 47.9 45.6 42.0 41.6 45.6 --  MCV 88.5 89.8 89.4 89.3 89.8 --  PLT 479* 448* 421* 358 370 --   Cardiac Enzymes:  Lab 10/26/11 1000 10/26/11 0424 10/25/11 2153  CKTOTAL -- -- --  CKMB -- -- --  CKMBINDEX -- -- --  TROPONINI <0.30 <0.30 <0.30   BNP (last 3 results)  Basename 10/25/11 2153  PROBNP 2078.0*   CBG:  Lab 10/30/11 0613 10/28/11 2045 10/27/11 1250 10/27/11 0827 10/27/11 0401  GLUCAP 91 97 124* 117* 110*    Recent Results (from the past 240 hour(s))  MRSA PCR SCREENING     Status: Normal  Collection Time   10/25/11  7:45 PM      Component Value Range Status Comment   MRSA by PCR NEGATIVE  NEGATIVE Final   CULTURE, BLOOD (ROUTINE X 2)     Status: Normal (Preliminary result)   Collection Time   10/25/11 10:10 PM      Component Value Range Status Comment   Specimen Description BLOOD RIGHT HAND   Final    Special Requests BOTTLES DRAWN AEROBIC ONLY 4CC   Final    Culture  Setup Time 10/26/2011 09:21   Final    Culture     Final    Value:        BLOOD CULTURE RECEIVED NO GROWTH TO DATE CULTURE WILL BE HELD FOR 5 DAYS BEFORE ISSUING A FINAL NEGATIVE REPORT   Report Status PENDING   Incomplete   CULTURE, BLOOD (ROUTINE X 2)     Status: Normal (Preliminary result)   Collection Time   10/25/11 10:15 PM      Component Value Range Status Comment   Specimen Description BLOOD LEFT HAND   Final     Special Requests BOTTLES DRAWN AEROBIC ONLY 4CC   Final    Culture  Setup Time 10/26/2011 09:21   Final    Culture     Final    Value:        BLOOD CULTURE RECEIVED NO GROWTH TO DATE CULTURE WILL BE HELD FOR 5 DAYS BEFORE ISSUING A FINAL NEGATIVE REPORT   Report Status PENDING   Incomplete      Studies: No results found.  Scheduled Meds:    . amitriptyline  25 mg Oral QHS  . antiseptic oral rinse  15 mL Mouth Rinse q12n4p  . buPROPion  450 mg Oral Daily  . chlorhexidine  15 mL Mouth Rinse BID  . LORazepam  1 mg Oral Once   Or  . LORazepam  1 mg Intramuscular Once  . risperiDONE  1 mg Oral QHS  . warfarin  12.5 mg Oral ONCE-1800  . Warfarin - Pharmacist Dosing Inpatient   Does not apply q1800   Continuous Infusions:    . heparin 1,900 Units/hr (10/31/11 0516)      Time spent: 30 min    [name]  [facility] Pager [phone]. If 8PM-8AM, please contact night-coverage at [email or web address], password [facility] 10/31/2011, 8:27 AM  LOS: 6 days

## 2011-10-31 NOTE — Progress Notes (Signed)
Pt ambulated in the hallway with 1 liter O2 and then on RA.  O2 sats remained above 92%.  From 92-100% throughout walk of approximately 500 feet. No SOB, dizziness or weakness.  Pt had elevated HR 110-150's during walk and returned to 110's at rest in room.  Pt currently on room air.  Will continue to monitor.  Family at bedside and call bell within reach. Thomas Hoff

## 2011-10-31 NOTE — Progress Notes (Signed)
Pt ambulated in hallway  500 ft and tolerated activity well. Will continue to monitor.

## 2011-10-31 NOTE — Progress Notes (Signed)
Weaning pt off O2.  Currently at 2 liters at 98%.  Will continue to monitor.  Pt is aware we are weaning him off O2.  Will ambulate later in the hallway and re-check. Thomas Hoff

## 2011-10-31 NOTE — Progress Notes (Signed)
ANTICOAGULATION CONSULT NOTE - Follow Up Consult  Pharmacy Consult for Heparin and Coumadin  Indication: DVT and massive PE  Labs:  Basename 10/31/11 0500 10/30/11 0515 10/29/11 1747 10/29/11 0520  HGB 16.8 15.9 -- --  HCT 47.9 45.6 -- 42.0  PLT 479* 448* -- 421*  APTT -- -- -- --  LABPROT 20.5* 18.1* -- 14.7  INR 1.83* 1.55* -- 1.17  HEPARINUNFRC 0.54 0.61 0.46 --  CREATININE -- -- -- --  CKTOTAL -- -- -- --  CKMB -- -- -- --  TROPONINI -- -- -- --    Assessment: Heparin level therapeutic at 0.54 today on IV heparin 1900 units/hr and INR = 1.83 on Coumadin in this 26yo male on heparin bridge to coumadin overlap D#5/5 minimum for DVT/massive PE. S/p recent knee surgery 10/20/11. CBC stable, No bleeding noted.  Continue overlap heparin/coumadin since INR is subtherapeutic.  -Hypercoagulable panel showed lupus anticoagulant positive. While on heparin. MD notes they will have to repeat as an outpatient.    Goal of Therapy:  Heparin level 0.3-0.7 units/ml INR = 2-3   Plan:  -No heparin changes needed. Continue Heparin IV drip 1900 units/hr. -Repeat Coumadin 10 mg po x 1 -Follow up daily AM   Noah Delaine, RPh Clinical Pharmacist Pager: [phone]   10/31/2011 1:20 PM

## 2011-11-01 LAB — CBC
Hemoglobin: 16.1 g/dL (ref 13.0–17.0)
MCH: 30.8 pg (ref 26.0–34.0)
MCV: 88.5 fL (ref 78.0–100.0)
Platelets: 515 10*3/uL — ABNORMAL HIGH (ref 150–400)
RBC: 5.22 MIL/uL (ref 4.22–5.81)
WBC: 15.6 10*3/uL — ABNORMAL HIGH (ref 4.0–10.5)

## 2011-11-01 LAB — CULTURE, BLOOD (ROUTINE X 2): Culture: NO GROWTH

## 2011-11-01 LAB — PROTIME-INR: INR: 2.28 — ABNORMAL HIGH (ref 0.00–1.49)

## 2011-11-01 MED ORDER — WARFARIN SODIUM 7.5 MG PO TABS
7.5000 mg | ORAL_TABLET | Freq: Once | ORAL | Status: AC
  Administered 2011-11-01: 7.5 mg via ORAL
  Filled 2011-11-01: qty 1

## 2011-11-01 NOTE — Progress Notes (Signed)
ANTICOAGULATION CONSULT NOTE - Follow Up Consult  Pharmacy Consult for Heparin and Coumadin  Indication: DVT and massive PE  Labs:  [facility] 11/01/11 0551 10/31/11 0500 10/30/11 0515  HGB 16.1 16.8 --  HCT 46.2 47.9 45.6  PLT 515* 479* 448*  APTT -- -- --  LABPROT 24.1* 20.5* 18.1*  INR 2.28* 1.83* 1.55*  HEPARINUNFRC 0.61 0.54 0.61  CREATININE -- -- --  CKTOTAL -- -- --  CKMB -- -- --  TROPONINI -- -- --    Assessment: Heparin level therapeutic 0.61 today on IV heparin 1900 units/hr and INR = 2.28 on Coumadin in this 25yo male for acute DVT/massive bilateral PE. S/p recent knee surgery 10/20/11.On heparin bridge to coumadin overlap D#6.  Today is 1st day with therapeutic INR. Need to continue overlap with IV heparin until INR=/ >2 x 24 hours.  CBC stable, no bleeding noted.  Warfarin predictor point score for this patient =9 points.  -Hypercoagulable panel showed lupus anticoagulant positive while on heparin. MD notes they will have to repeat this lab as an outpatient.    Goal of Therapy:  Heparin level 0.3-0.7 units/ml INR = 2-3   Plan:  -Continue same rate of Heparin IV drip 1900 units/hr. -Repeat Coumadin 7.5 mg po x 1 -Follow up daily AM heparin level, INR and CBC  Noah Delaine, RPh Clinical Pharmacist Pager: [phone]  11/01/2011 11:33 AM

## 2011-11-01 NOTE — Progress Notes (Signed)
[facility] PROGRESS NOTE  Andre Cervantes ZOX:[id number] DOB: [birth year] DOA: 10/25/2011 PCP: No primary provider on file.  breif interval history: 26 years old male with PMH relevant for  ADD, dyslipidemia and recent right knee surgery on September 24. Massive PE, received Lovenox from [facility].  Assessment/Plan: Active Problems: Acute large bilateral Pulmonary emboli/ DVT (deep venous thrombosis): -continue heparin and warfarin. INR mtherapeutic, will monitor in hospital on IV heparin and warfarin for 24 hours. -echo showed right heart strained. -Hypercoagulable panel showed lupus anticoagulant positive. While on heparin. Will have to repeat as an outpatient.   Code Status: full Disposition Plan: home.   Consultants:  none  Procedures:  none  Antibiotics:  none (indicate start date, and stop date if known)  HPI/Subjective: No complains.  Objective: Filed Vitals:   10/31/11 1500 10/31/11 1700 10/31/11 2040 11/01/11 0435  BP: 122/68  122/82 106/65  Pulse: 88 118 110 94  Temp: 98.5 F (36.9 C)  97.4 F (36.3 C) 97.5 F (36.4 C)  TempSrc:   Oral Oral  Resp: 18  20 20   Height:      Weight:    84.596 kg (186 lb 8 oz)  SpO2: 100% 98% 99% 98%    Intake/Output Summary (Last 24 hours) at 11/01/11 0829 Last data filed at 11/01/11 0300  Gross per 24 hour  Intake    720 ml  Output    800 ml  Net    -80 ml   Filed Weights   10/30/11 0440 10/31/11 0524 11/01/11 0435  Weight: 84.8 kg (186 lb 15.2 oz) 83.8 kg (184 lb 11.9 oz) 84.596 kg (186 lb 8 oz)    Exam:   General:  A&O x 3  Cardiovascular: RRR  Respiratory: good air movement CTA b/l  Data Reviewed: Basic Metabolic Panel:  Lab 10/28/11 8119 10/27/11 0443 10/26/11 1000 10/25/11 2154  NA 139 137 137 138  K 3.7 3.9 4.0 5.0  CL 103 104 100 101  CO2 25 21 25 22   GLUCOSE 95 106* 142* 81  BUN 12 14 12 11   CREATININE 1.08 0.97 1.04 0.90  CALCIUM 9.7 9.5 10.3 10.4  MG -- -- -- 2.2  PHOS --  -- -- 3.3   Liver Function Tests:  Lab 10/28/11 0635 10/27/11 0443 10/25/11 2154  AST 45* 29 34  ALT 51 32 38  ALKPHOS 126* 138* 161*  BILITOT 0.2* 0.2* 0.3  PROT 7.3 7.3 8.2  ALBUMIN 3.3* 3.3* 3.9   No results found for this basename: LIPASE:5,AMYLASE:5 in the last 168 hours No results found for this basename: AMMONIA:5 in the last 168 hours CBC:  Lab 11/01/11 0551 10/31/11 0500 10/30/11 0515 10/29/11 0520 10/28/11 0635 10/27/11 0443 10/25/11 2154  WBC 15.6* 16.3* 13.9* 14.6* 13.3* -- --  NEUTROABS -- -- -- -- 6.2 8.8* 12.9*  HGB 16.1 16.8 15.9 14.5 14.2 -- --  HCT 46.2 47.9 45.6 42.0 41.6 -- --  MCV 88.5 88.5 89.8 89.4 89.3 -- --  PLT 515* 479* 448* 421* 358 -- --   Cardiac Enzymes:  Lab 10/26/11 1000 10/26/11 0424 10/25/11 2153  CKTOTAL -- -- --  CKMB -- -- --  CKMBINDEX -- -- --  TROPONINI <0.30 <0.30 <0.30   BNP (last 3 results)  Basename 10/25/11 2153  PROBNP 2078.0*   CBG:  Lab 10/30/11 0613 10/28/11 2045 10/27/11 1250 10/27/11 0827 10/27/11 0401  GLUCAP 91 97 124* 117* 110*    Recent Results (from the past 240 hour(s))  MRSA PCR SCREENING     Status: Normal   Collection Time   10/25/11  7:45 PM      Component Value Range Status Comment   MRSA by PCR NEGATIVE  NEGATIVE Final   CULTURE, BLOOD (ROUTINE X 2)     Status: Normal (Preliminary result)   Collection Time   10/25/11 10:10 PM      Component Value Range Status Comment   Specimen Description BLOOD RIGHT HAND   Final    Special Requests BOTTLES DRAWN AEROBIC ONLY 4CC   Final    Culture  Setup Time 10/26/2011 09:21   Final    Culture     Final    Value:        BLOOD CULTURE RECEIVED NO GROWTH TO DATE CULTURE WILL BE HELD FOR 5 DAYS BEFORE ISSUING A FINAL NEGATIVE REPORT   Report Status PENDING   Incomplete   CULTURE, BLOOD (ROUTINE X 2)     Status: Normal (Preliminary result)   Collection Time   10/25/11 10:15 PM      Component Value Range Status Comment   Specimen Description BLOOD LEFT HAND    Final    Special Requests BOTTLES DRAWN AEROBIC ONLY 4CC   Final    Culture  Setup Time 10/26/2011 09:21   Final    Culture     Final    Value:        BLOOD CULTURE RECEIVED NO GROWTH TO DATE CULTURE WILL BE HELD FOR 5 DAYS BEFORE ISSUING A FINAL NEGATIVE REPORT   Report Status PENDING   Incomplete      Studies: No results found.  Scheduled Meds:    . amitriptyline  25 mg Oral QHS  . antiseptic oral rinse  15 mL Mouth Rinse q12n4p  . buPROPion  450 mg Oral Daily  . chlorhexidine  15 mL Mouth Rinse BID  . risperiDONE  1 mg Oral QHS  . warfarin  10 mg Oral ONCE-1800  . Warfarin - Pharmacist Dosing Inpatient   Does not apply q1800   Continuous Infusions:    . heparin 1,900 Units/hr (11/01/11 0723)      Time spent: 30 min    Andre Cervantes Rosine Beat  [facility] Pager [phone]. If 8PM-8AM, please contact night-coverage at [email or web address], password [facility] 11/01/2011, 8:29 AM  LOS: 7 days

## 2011-11-01 NOTE — Discharge Summary (Addendum)
Physician Discharge Summary  Andre Cervantes:[id number] DOB: 03/23/1985 DOA: 10/25/2011  PCP: No primary provider on file.  Admit date: 10/25/2011 Discharge date: 11/01/2011  Recommendations for Outpatient Follow-up:  1. Will need a hypercoagulable panel repeated in 6 weeks. 2. INR checked on Wednesday then twice a week  Discharge Diagnoses:  Active Problems:  Pulmonary emboli  DVT (deep venous thrombosis) Acute hypoxic respiratory failure  Discharge Condition: Stable  Diet recommendation: Regular  Filed Weights   10/30/11 0440 10/31/11 0524 11/01/11 0435  Weight: 84.8 kg (186 lb 15.2 oz) 83.8 kg (184 lb 11.9 oz) 84.596 kg (186 lb 8 oz)    History of present illness:  26 years old male with PMH relevant for ADD, dyslipidemia and recent right knee surgery on September 24. He was discharged home and apparently the next day experienced sudden onset of SOB that progressed over the next days. Today he went to the ED of [facility] and a CTA of the chest revealed a large bilateral PE. (please see report in chart). He was transferred to [facility] ICU for treatment. At the time of my examination the patient is awake, alert, in mild respiratory distress, tachycardic to the 130's, normotensive, with O2 sat of 90 to 94% on 6 L . In the ED of [facility] he received full dose Lovenox at 5:30 PM on 10/25/11. The patient denies fever, chest pain. N/V/D, urinary symptoms.    Hospital Course:  Acute large bilateral Pulmonary emboli/ DVT (deep venous thrombosis):  -26 years old male with PMH relevant for ADD, dyslipidemia and recent right knee surgery on September 24. Massive PE, received Lovenox from [facility]. Admitted by the ICU team because of respiratory distress and tachycardia. Start her on heparin and warfarin. He required no TPA -continue heparin and warfarin until INR therapeutic, monitor in hospital on IV heparin and warfarin for 24 hours after the INR was greater than 2. Will  follow up at coumadin clinic. -echo showed right heart strained.  -Hypercoagulable panel showed lupus anticoagulant positive. While on heparin. Will have to repeat as an outpatient.  Acute hypoxic respiratory failure: -He was admitted by PCCM and was started on 6 L of nasal cannula oxygen. Patient was able to maintain his saturations with this and he was titrated down. This probably secondary to acute large pulmonary emboli. By the day of discharge comes in to ambulate without any oxygen.  Procedures:  2-D echo that showed right heart strain (i.e. Studies not automatically included, echos, thoracentesis, etc; not x-rays)  Consultations:  None  Discharge Exam: Filed Vitals:   10/31/11 1500 10/31/11 1700 10/31/11 2040 11/01/11 0435  BP: 122/68  122/82 106/65  Pulse: 88 118 110 94  Temp: 98.5 F (36.9 C)  97.4 F (36.3 C) 97.5 F (36.4 C)  TempSrc:   Oral Oral  Resp: 18  20 20   Height:      Weight:    84.596 kg (186 lb 8 oz)  SpO2: 100% 98% 99% 98%    General: Awake alert oriented times Cardiovascular: Regular rate and rhythm Respiratory: Good air movement clear to auscultation  Discharge Instructions     Medication List     As of 11/01/2011  8:30 AM    ASK your doctor about these medications         amitriptyline 25 MG tablet   Commonly known as: ELAVIL   Take 25 mg by mouth at bedtime.      buPROPion 150 MG 24 hr tablet  Commonly known as: WELLBUTRIN XL   Take 450 mg by mouth daily.      loratadine 10 MG tablet   Commonly known as: CLARITIN   Take 10 mg by mouth daily.      omeprazole 20 MG capsule   Commonly known as: PRILOSEC   Take 20 mg by mouth 2 (two) times daily.      pravastatin 80 MG tablet   Commonly known as: PRAVACHOL   Take 80 mg by mouth at bedtime.      risperiDONE 1 MG tablet   Commonly known as: RISPERDAL   Take 1 mg by mouth at bedtime.          The results of significant diagnostics from this hospitalization (including  imaging, microbiology, ancillary and laboratory) are listed below for reference.    Significant Diagnostic Studies: Dg Chest Port 1 View  10/27/2011  *RADIOLOGY REPORT*  Clinical Data: Shortness of breath and pulmonary embolism.  PORTABLE CHEST - 1 VIEW  Comparison: 10/26/2011  Findings: Single view of the chest again demonstrates patchy parenchymal densities in the left lower lobe.  Right lung remains clear.  Stable appearance of the heart and mediastinum and the trachea is midline.  IMPRESSION: Persistent airspace disease or parenchymal disease in the left lower lobe.  Minimal change from the previous examination.   Original Report Authenticated By: Richarda Overlie, M.D.    Dg Chest Port 1 View  10/26/2011  *RADIOLOGY REPORT*  Clinical Data: Hypoxic  PORTABLE CHEST - 1 VIEW  Comparison: 10/25/2011 chest CT  Findings: Retrocardiac opacity; atelectasis, pneumonia, or infarct in this patient with known pulmonary embolism. The appearance is similar to prior.  Cardiomediastinal contours are unchanged.  The right lung remains promptly clear.  No pleural effusion or pneumothorax identified.  No acute osseous finding.  IMPRESSION: Retrocardiac opacity; atelectasis, pneumonia, or infarct in this patient with known pulmonary embolism. The appearance is similar to prior.   Original Report Authenticated By: Waneta Martins, M.D.     Microbiology: Recent Results (from the past 240 hour(s))  MRSA PCR SCREENING     Status: Normal   Collection Time   10/25/11  7:45 PM      Component Value Range Status Comment   MRSA by PCR NEGATIVE  NEGATIVE Final   CULTURE, BLOOD (ROUTINE X 2)     Status: Normal (Preliminary result)   Collection Time   10/25/11 10:10 PM      Component Value Range Status Comment   Specimen Description BLOOD RIGHT HAND   Final    Special Requests BOTTLES DRAWN AEROBIC ONLY 4CC   Final    Culture  Setup Time 10/26/2011 09:21   Final    Culture     Final    Value:        BLOOD CULTURE RECEIVED NO  GROWTH TO DATE CULTURE WILL BE HELD FOR 5 DAYS BEFORE ISSUING A FINAL NEGATIVE REPORT   Report Status PENDING   Incomplete   CULTURE, BLOOD (ROUTINE X 2)     Status: Normal (Preliminary result)   Collection Time   10/25/11 10:15 PM      Component Value Range Status Comment   Specimen Description BLOOD LEFT HAND   Final    Special Requests BOTTLES DRAWN AEROBIC ONLY 4CC   Final    Culture  Setup Time 10/26/2011 09:21   Final    Culture     Final    Value:        BLOOD  CULTURE RECEIVED NO GROWTH TO DATE CULTURE WILL BE HELD FOR 5 DAYS BEFORE ISSUING A FINAL NEGATIVE REPORT   Report Status PENDING   Incomplete      Labs: Basic Metabolic Panel:  Lab 10/28/11 1610 10/27/11 0443 10/26/11 1000 10/25/11 2154  NA 139 137 137 138  K 3.7 3.9 4.0 5.0  CL 103 104 100 101  CO2 25 21 25 22   GLUCOSE 95 106* 142* 81  BUN 12 14 12 11   CREATININE 1.08 0.97 1.04 0.90  CALCIUM 9.7 9.5 10.3 10.4  MG -- -- -- 2.2  PHOS -- -- -- 3.3   Liver Function Tests:  Lab 10/28/11 0635 10/27/11 0443 10/25/11 2154  AST 45* 29 34  ALT 51 32 38  ALKPHOS 126* 138* 161*  BILITOT 0.2* 0.2* 0.3  PROT 7.3 7.3 8.2  ALBUMIN 3.3* 3.3* 3.9   No results found for this basename: LIPASE:5,AMYLASE:5 in the last 168 hours No results found for this basename: AMMONIA:5 in the last 168 hours CBC:  Lab 11/01/11 0551 10/31/11 0500 10/30/11 0515 10/29/11 0520 10/28/11 0635 10/27/11 0443 10/25/11 2154  WBC 15.6* 16.3* 13.9* 14.6* 13.3* -- --  NEUTROABS -- -- -- -- 6.2 8.8* 12.9*  HGB 16.1 16.8 15.9 14.5 14.2 -- --  HCT 46.2 47.9 45.6 42.0 41.6 -- --  MCV 88.5 88.5 89.8 89.4 89.3 -- --  PLT 515* 479* 448* 421* 358 -- --   Cardiac Enzymes:  Lab 10/26/11 1000 10/26/11 0424 10/25/11 2153  CKTOTAL -- -- --  CKMB -- -- --  CKMBINDEX -- -- --  TROPONINI <0.30 <0.30 <0.30   BNP: BNP (last 3 results)  Basename 10/25/11 2153  PROBNP 2078.0*   CBG:  Lab 10/30/11 0613 10/28/11 2045 10/27/11 1250 10/27/11 0827 10/27/11  0401  GLUCAP 91 97 124* 117* 110*    Time coordinating discharge: 35 minutes minutes  Signed:  David Stall, Maia Handa  [facility] 11/01/2011, 8:30 AM

## 2011-11-02 LAB — CBC
HCT: 46.2 % (ref 39.0–52.0)
MCV: 88.8 fL (ref 78.0–100.0)
Platelets: 505 10*3/uL — ABNORMAL HIGH (ref 150–400)
RBC: 5.2 MIL/uL (ref 4.22–5.81)
RDW: 12.3 % (ref 11.5–15.5)
WBC: 14.9 10*3/uL — ABNORMAL HIGH (ref 4.0–10.5)

## 2011-11-02 LAB — PROTIME-INR
INR: 2.32 — ABNORMAL HIGH (ref 0.00–1.49)
Prothrombin Time: 24.4 s — ABNORMAL HIGH (ref 11.6–15.2)

## 2011-11-02 LAB — HEPARIN LEVEL (UNFRACTIONATED): Heparin Unfractionated: 0.63 [IU]/mL (ref 0.30–0.70)

## 2011-11-02 MED ORDER — WARFARIN SODIUM 5 MG PO TABS: 7.5000 mg | ORAL_TABLET | Freq: Every day | ORAL | Status: DC

## 2011-11-02 NOTE — Progress Notes (Addendum)
Ambulated pt on room air. Pt's O2 100%, and HR 150's. Pt experienced no SOB. MD aware. Getting pt ready for discharge. Pt is stable.

## 2011-11-02 NOTE — Progress Notes (Signed)
Discharge instructions given to pt and pt's family along with prescription. Stressed the importance of the pt following up at the coumadin clinic and taking coumadin daily. Pt is stable and ready for discharge with family.

## 2012-02-05 ENCOUNTER — Other Ambulatory Visit: Payer: Self-pay | Admitting: Family Medicine

## 2012-03-12 ENCOUNTER — Other Ambulatory Visit: Payer: Self-pay

## 2012-12-01 ENCOUNTER — Other Ambulatory Visit: Payer: Self-pay

## 2012-12-16 ENCOUNTER — Other Ambulatory Visit: Payer: Self-pay | Admitting: Orthopedic Surgery

## 2013-01-04 ENCOUNTER — Encounter (HOSPITAL_BASED_OUTPATIENT_CLINIC_OR_DEPARTMENT_OTHER): Payer: Self-pay | Admitting: *Deleted

## 2013-01-04 NOTE — Progress Notes (Signed)
Spoke with Mother-Instructed Npo after Mn-including chewing tobacco.Istat,Ekg on arrival-will take lovenox,atenolol omeprazole with small amt water.To arrive at 0830. 

## 2013-01-05 ENCOUNTER — Ambulatory Visit (HOSPITAL_BASED_OUTPATIENT_CLINIC_OR_DEPARTMENT_OTHER): Payer: PRIVATE HEALTH INSURANCE | Admitting: Anesthesiology

## 2013-01-05 ENCOUNTER — Observation Stay (HOSPITAL_BASED_OUTPATIENT_CLINIC_OR_DEPARTMENT_OTHER)
Admission: RE | Admit: 2013-01-05 | Discharge: 2013-01-06 | Disposition: A | Discharge status: Home / Self Care | Payer: PRIVATE HEALTH INSURANCE | Source: Ambulatory Visit | Attending: Specialist | Admitting: Specialist

## 2013-01-05 ENCOUNTER — Encounter (HOSPITAL_BASED_OUTPATIENT_CLINIC_OR_DEPARTMENT_OTHER): Payer: PRIVATE HEALTH INSURANCE | Admitting: Anesthesiology

## 2013-01-05 ENCOUNTER — Encounter (HOSPITAL_BASED_OUTPATIENT_CLINIC_OR_DEPARTMENT_OTHER): Payer: Self-pay | Admitting: *Deleted

## 2013-01-05 ENCOUNTER — Encounter (HOSPITAL_COMMUNITY)
Admission: RE | Disposition: A | Discharge status: Home / Self Care | Payer: Self-pay | Source: Ambulatory Visit | Attending: Specialist

## 2013-01-05 DIAGNOSIS — Z7901 Long term (current) use of anticoagulants: Secondary | ICD-10-CM | POA: Insufficient documentation

## 2013-01-05 DIAGNOSIS — K219 Gastro-esophageal reflux disease without esophagitis: Secondary | ICD-10-CM | POA: Insufficient documentation

## 2013-01-05 DIAGNOSIS — Z86711 Personal history of pulmonary embolism: Secondary | ICD-10-CM | POA: Insufficient documentation

## 2013-01-05 DIAGNOSIS — Z79899 Other long term (current) drug therapy: Secondary | ICD-10-CM | POA: Insufficient documentation

## 2013-01-05 DIAGNOSIS — F17200 Nicotine dependence, unspecified, uncomplicated: Secondary | ICD-10-CM | POA: Insufficient documentation

## 2013-01-05 DIAGNOSIS — Z9889 Other specified postprocedural states: Secondary | ICD-10-CM

## 2013-01-05 DIAGNOSIS — D72829 Elevated white blood cell count, unspecified: Secondary | ICD-10-CM | POA: Insufficient documentation

## 2013-01-05 DIAGNOSIS — I10 Essential (primary) hypertension: Secondary | ICD-10-CM | POA: Insufficient documentation

## 2013-01-05 DIAGNOSIS — M24469 Recurrent dislocation, unspecified knee: Principal | ICD-10-CM | POA: Insufficient documentation

## 2013-01-05 DIAGNOSIS — Z86718 Personal history of other venous thrombosis and embolism: Secondary | ICD-10-CM | POA: Insufficient documentation

## 2013-01-05 HISTORY — DX: Gastro-esophageal reflux disease without esophagitis: K21.9

## 2013-01-05 HISTORY — PX: KNEE ARTHROSCOPY WITH LATERAL RELEASE: SHX5649

## 2013-01-05 HISTORY — DX: Essential (primary) hypertension: I10

## 2013-01-05 HISTORY — DX: Other specified postprocedural states: Z98.890

## 2013-01-05 HISTORY — PX: MEDIAL PATELLOFEMORAL LIGAMENT REPAIR: SHX2020

## 2013-01-05 HISTORY — DX: Acute embolism and thrombosis of unspecified deep veins of unspecified lower extremity: I82.409

## 2013-01-05 LAB — POCT I-STAT 4, (NA,K, GLUC, HGB,HCT)
Potassium: 4.1 mEq/L (ref 3.5–5.1)
Sodium: 140 mEq/L (ref 135–145)

## 2013-01-05 SURGERY — ARTHROSCOPY, KNEE, WITH LATERAL RETINACULUM RELEASE
Anesthesia: General | Site: Knee | Laterality: Right

## 2013-01-05 MED ORDER — BUPIVACAINE LIPOSOME 1.3 % IJ SUSP
INTRAMUSCULAR | Status: DC | PRN
  Administered 2013-01-05: 20 mL

## 2013-01-05 MED ORDER — KETAMINE HCL 50 MG/ML IJ SOLN
INTRAMUSCULAR | Status: AC
  Filled 2013-01-05: qty 10

## 2013-01-05 MED ORDER — EPHEDRINE SULFATE 50 MG/ML IJ SOLN
INTRAMUSCULAR | Status: DC | PRN
  Administered 2013-01-05 (×4): 10 mg via INTRAVENOUS

## 2013-01-05 MED ORDER — ACETAMINOPHEN 10 MG/ML IV SOLN
INTRAVENOUS | Status: DC | PRN
  Administered 2013-01-05: 1000 mg via INTRAVENOUS

## 2013-01-05 MED ORDER — METOCLOPRAMIDE HCL 10 MG PO TABS: 5.0000 mg | ORAL_TABLET | Freq: Three times a day (TID) | ORAL | Status: DC | PRN

## 2013-01-05 MED ORDER — DIPHENHYDRAMINE HCL 12.5 MG/5ML PO ELIX: 12.5000 mg | ORAL_SOLUTION | ORAL | Status: DC | PRN

## 2013-01-05 MED ORDER — DEXTROSE 5 % IV SOLN
500.0000 mg | Freq: Four times a day (QID) | INTRAVENOUS | Status: DC | PRN
  Filled 2013-01-05: qty 5

## 2013-01-05 MED ORDER — ONDANSETRON HCL 4 MG/2ML IJ SOLN: 4.0000 mg | Freq: Four times a day (QID) | INTRAMUSCULAR | Status: DC | PRN

## 2013-01-05 MED ORDER — BUPIVACAINE-EPINEPHRINE PF 0.25-1:200000 % IJ SOLN
INTRAMUSCULAR | Status: DC | PRN
  Administered 2013-01-05: 30 mL via PERINEURAL

## 2013-01-05 MED ORDER — CEFAZOLIN SODIUM-DEXTROSE 2-3 GM-% IV SOLR
2.0000 g | INTRAVENOUS | Status: AC
  Administered 2013-01-05: 2 g via INTRAVENOUS
  Filled 2013-01-05: qty 50

## 2013-01-05 MED ORDER — SODIUM CHLORIDE 0.9 % IV SOLN
INTRAVENOUS | Status: DC
  Filled 2013-01-05: qty 1000

## 2013-01-05 MED ORDER — DEXAMETHASONE SODIUM PHOSPHATE 4 MG/ML IJ SOLN
INTRAMUSCULAR | Status: DC | PRN
  Administered 2013-01-05: 10 mg via INTRAVENOUS

## 2013-01-05 MED ORDER — MIDAZOLAM HCL 5 MG/5ML IJ SOLN
INTRAMUSCULAR | Status: DC | PRN
  Administered 2013-01-05: 2 mg via INTRAVENOUS

## 2013-01-05 MED ORDER — PANTOPRAZOLE SODIUM 40 MG PO TBEC
40.0000 mg | DELAYED_RELEASE_TABLET | Freq: Every day | ORAL | Status: DC
  Administered 2013-01-06: 11:00:00 40 mg via ORAL
  Filled 2013-01-05: qty 1

## 2013-01-05 MED ORDER — OXYCODONE-ACETAMINOPHEN 5-325 MG PO TABS
1.0000 | ORAL_TABLET | ORAL | Status: DC | PRN
  Administered 2013-01-05 – 2013-01-06 (×3): 2 via ORAL
  Filled 2013-01-05 (×3): qty 2

## 2013-01-05 MED ORDER — ALBUTEROL SULFATE HFA 108 (90 BASE) MCG/ACT IN AERS
INHALATION_SPRAY | RESPIRATORY_TRACT | Status: DC | PRN
  Administered 2013-01-05: 3 via RESPIRATORY_TRACT

## 2013-01-05 MED ORDER — ATENOLOL 50 MG PO TABS
50.0000 mg | ORAL_TABLET | Freq: Every day | ORAL | Status: DC
  Administered 2013-01-06: 50 mg via ORAL
  Filled 2013-01-05: qty 1

## 2013-01-05 MED ORDER — METHOCARBAMOL 500 MG PO TABS
500.0000 mg | ORAL_TABLET | Freq: Four times a day (QID) | ORAL | Status: DC | PRN
  Administered 2013-01-06: 500 mg via ORAL
  Filled 2013-01-05: qty 1

## 2013-01-05 MED ORDER — BUPROPION HCL ER (XL) 300 MG PO TB24
450.0000 mg | ORAL_TABLET | Freq: Every day | ORAL | Status: DC
  Administered 2013-01-05 – 2013-01-06 (×2): 450 mg via ORAL
  Filled 2013-01-05 (×2): qty 1

## 2013-01-05 MED ORDER — WARFARIN SODIUM 6 MG PO TABS
6.0000 mg | ORAL_TABLET | Freq: Every day | ORAL | Status: DC
  Administered 2013-01-05: 19:00:00 6 mg via ORAL
  Filled 2013-01-05 (×2): qty 1

## 2013-01-05 MED ORDER — SODIUM CHLORIDE 0.9 % IR SOLN
Status: DC | PRN
  Administered 2013-01-05: 9000 mL

## 2013-01-05 MED ORDER — LACTATED RINGERS IV SOLN
INTRAVENOUS | Status: DC
  Administered 2013-01-05 (×3): via INTRAVENOUS
  Filled 2013-01-05: qty 1000

## 2013-01-05 MED ORDER — METOCLOPRAMIDE HCL 5 MG/ML IJ SOLN: 5.0000 mg | Freq: Three times a day (TID) | INTRAMUSCULAR | Status: DC | PRN

## 2013-01-05 MED ORDER — ENOXAPARIN SODIUM 30 MG/0.3ML ~~LOC~~ SOLN: 30.0000 mg | Freq: Two times a day (BID) | SUBCUTANEOUS | Status: DC

## 2013-01-05 MED ORDER — AMITRIPTYLINE HCL 25 MG PO TABS
25.0000 mg | ORAL_TABLET | Freq: Every day | ORAL | Status: DC
  Administered 2013-01-05: 25 mg via ORAL
  Filled 2013-01-05 (×2): qty 1

## 2013-01-05 MED ORDER — PROPOFOL 10 MG/ML IV BOLUS
INTRAVENOUS | Status: DC | PRN
  Administered 2013-01-05: 250 mg via INTRAVENOUS

## 2013-01-05 MED ORDER — SENNA 8.6 MG PO TABS
1.0000 | ORAL_TABLET | Freq: Two times a day (BID) | ORAL | Status: DC
  Administered 2013-01-05 – 2013-01-06 (×2): 8.6 mg via ORAL
  Filled 2013-01-05 (×2): qty 1

## 2013-01-05 MED ORDER — MIDAZOLAM HCL 2 MG/2ML IJ SOLN
INTRAMUSCULAR | Status: AC
  Filled 2013-01-05: qty 2

## 2013-01-05 MED ORDER — BISACODYL 5 MG PO TBEC: 5.0000 mg | DELAYED_RELEASE_TABLET | Freq: Every day | ORAL | Status: DC | PRN

## 2013-01-05 MED ORDER — ONDANSETRON HCL 4 MG/2ML IJ SOLN
INTRAMUSCULAR | Status: DC | PRN
  Administered 2013-01-05: 4 mg via INTRAVENOUS

## 2013-01-05 MED ORDER — HYDROMORPHONE HCL PF 1 MG/ML IJ SOLN
0.2500 mg | INTRAMUSCULAR | Status: DC | PRN
  Administered 2013-01-05: 0.2 mg via INTRAVENOUS
  Filled 2013-01-05: qty 1

## 2013-01-05 MED ORDER — TISSEEL VH 10 ML EX KIT
PACK | CUTANEOUS | Status: DC | PRN
  Administered 2013-01-05: 10 mL

## 2013-01-05 MED ORDER — FENTANYL CITRATE 0.05 MG/ML IJ SOLN
INTRAMUSCULAR | Status: AC
  Filled 2013-01-05: qty 4

## 2013-01-05 MED ORDER — POLYETHYLENE GLYCOL 3350 17 G PO PACK: 17.0000 g | PACK | Freq: Every day | ORAL | Status: DC | PRN

## 2013-01-05 MED ORDER — KETAMINE HCL 10 MG/ML IJ SOLN
INTRAMUSCULAR | Status: DC | PRN
  Administered 2013-01-05: 25 mg via INTRAVENOUS

## 2013-01-05 MED ORDER — LACTATED RINGERS IV SOLN
INTRAVENOUS | Status: DC
  Filled 2013-01-05: qty 1000

## 2013-01-05 MED ORDER — RISPERIDONE 1 MG PO TABS
1.0000 mg | ORAL_TABLET | Freq: Every day | ORAL | Status: DC
  Administered 2013-01-05: 22:00:00 1 mg via ORAL
  Filled 2013-01-05 (×2): qty 1

## 2013-01-05 MED ORDER — POVIDONE-IODINE 7.5 % EX SOLN
Freq: Once | CUTANEOUS | Status: DC
  Filled 2013-01-05: qty 118

## 2013-01-05 MED ORDER — LORATADINE 10 MG PO TABS
10.0000 mg | ORAL_TABLET | Freq: Every day | ORAL | Status: DC
  Administered 2013-01-06: 11:00:00 10 mg via ORAL
  Filled 2013-01-05: qty 1

## 2013-01-05 MED ORDER — PHENYLEPHRINE HCL 10 MG/ML IJ SOLN
10.0000 mg | INTRAMUSCULAR | Status: DC | PRN
  Administered 2013-01-05: 50 ug/min via INTRAVENOUS

## 2013-01-05 MED ORDER — HYDROMORPHONE HCL PF 1 MG/ML IJ SOLN
0.5000 mg | INTRAMUSCULAR | Status: DC | PRN
  Administered 2013-01-05: 19:00:00 1 mg via INTRAVENOUS
  Filled 2013-01-05: qty 1

## 2013-01-05 MED ORDER — LIDOCAINE HCL (CARDIAC) 20 MG/ML IV SOLN
INTRAVENOUS | Status: DC | PRN
  Administered 2013-01-05: 80 mg via INTRAVENOUS

## 2013-01-05 MED ORDER — SIMVASTATIN 10 MG PO TABS
10.0000 mg | ORAL_TABLET | Freq: Every day | ORAL | Status: DC
  Administered 2013-01-05: 10 mg via ORAL
  Filled 2013-01-05 (×2): qty 1

## 2013-01-05 MED ORDER — FENTANYL CITRATE 0.05 MG/ML IJ SOLN
INTRAMUSCULAR | Status: DC | PRN
  Administered 2013-01-05 (×4): 50 ug via INTRAVENOUS

## 2013-01-05 MED ORDER — POTASSIUM CHLORIDE IN NACL 20-0.9 MEQ/L-% IV SOLN
INTRAVENOUS | Status: DC
  Administered 2013-01-05: 18:00:00 via INTRAVENOUS
  Filled 2013-01-05 (×3): qty 1000

## 2013-01-05 MED ORDER — ENOXAPARIN SODIUM 30 MG/0.3ML ~~LOC~~ SOLN
30.0000 mg | Freq: Two times a day (BID) | SUBCUTANEOUS | Status: DC
  Administered 2013-01-06: 30 mg via SUBCUTANEOUS
  Filled 2013-01-05 (×3): qty 0.3

## 2013-01-05 MED ORDER — TAMSULOSIN HCL 0.4 MG PO CAPS
0.4000 mg | ORAL_CAPSULE | Freq: Every day | ORAL | Status: DC
  Administered 2013-01-05 – 2013-01-06 (×2): 0.4 mg via ORAL
  Filled 2013-01-05 (×2): qty 1

## 2013-01-05 MED ORDER — FENOFIBRATE 160 MG PO TABS
160.0000 mg | ORAL_TABLET | Freq: Every day | ORAL | Status: DC
  Administered 2013-01-05 – 2013-01-06 (×2): 160 mg via ORAL
  Filled 2013-01-05 (×2): qty 1

## 2013-01-05 MED ORDER — HYDROMORPHONE HCL PF 1 MG/ML IJ SOLN
INTRAMUSCULAR | Status: AC
  Filled 2013-01-05: qty 1

## 2013-01-05 MED ORDER — WARFARIN - PHYSICIAN DOSING INPATIENT: Freq: Every day | Status: DC

## 2013-01-05 MED ORDER — PROMETHAZINE HCL 25 MG/ML IJ SOLN
6.2500 mg | INTRAMUSCULAR | Status: DC | PRN
  Filled 2013-01-05: qty 1

## 2013-01-05 MED ORDER — SUCCINYLCHOLINE CHLORIDE 20 MG/ML IJ SOLN
INTRAMUSCULAR | Status: DC | PRN
  Administered 2013-01-05: 100 mg via INTRAVENOUS

## 2013-01-05 MED ORDER — CEFAZOLIN SODIUM 1-5 GM-% IV SOLN
1.0000 g | Freq: Four times a day (QID) | INTRAVENOUS | Status: AC
  Administered 2013-01-05 (×2): 1 g via INTRAVENOUS
  Filled 2013-01-05 (×2): qty 50

## 2013-01-05 MED ORDER — DOCUSATE SODIUM 100 MG PO CAPS
100.0000 mg | ORAL_CAPSULE | Freq: Two times a day (BID) | ORAL | Status: DC
  Administered 2013-01-05 – 2013-01-06 (×2): 100 mg via ORAL

## 2013-01-05 MED ORDER — ONDANSETRON HCL 4 MG PO TABS: 4.0000 mg | ORAL_TABLET | Freq: Four times a day (QID) | ORAL | Status: DC | PRN

## 2013-01-05 SURGICAL SUPPLY — 117 items
BANDAGE ELASTIC 4 VELCRO ST LF (GAUZE/BANDAGES/DRESSINGS) ×1 IMPLANT
BANDAGE ELASTIC 6 VELCRO ST LF (GAUZE/BANDAGES/DRESSINGS) ×1 IMPLANT
BANDAGE ESMARK 6X9 LF (GAUZE/BANDAGES/DRESSINGS) ×1 IMPLANT
BANDAGE GAUZE ELAST BULKY 4 IN (GAUZE/BANDAGES/DRESSINGS) ×1 IMPLANT
BLADE 4.2CUDA (BLADE) IMPLANT
BLADE CUDA 5.5 (BLADE) ×2 IMPLANT
BLADE CUDA GRT WHITE 3.5 (BLADE) IMPLANT
BLADE MINI RND TIP GREEN BEAV (BLADE) ×3 IMPLANT
BLADE SURG 10 STRL SS (BLADE) ×2 IMPLANT
BLADE SURG 11 STRL SS (BLADE) ×1 IMPLANT
BLADE SURG 15 STRL LF DISP TIS (BLADE) ×2 IMPLANT
BLADE SURG 15 STRL SS (BLADE)
BNDG CMPR 9X6 STRL LF SNTH (GAUZE/BANDAGES/DRESSINGS) ×1
BNDG ESMARK 6X9 LF (GAUZE/BANDAGES/DRESSINGS) ×2
BNDG GAUZE ELAST 4 BULKY (GAUZE/BANDAGES/DRESSINGS) ×4 IMPLANT
BUR OVAL 6.0 (BURR) ×1 IMPLANT
BUR VERTEX HOODED 4.5 (BURR) ×1 IMPLANT
CANISTER SUCT LVC 12 LTR MEDI- (MISCELLANEOUS) ×4 IMPLANT
CANISTER SUCTION 1200CC (MISCELLANEOUS) ×2 IMPLANT
CANISTER SUCTION 2500CC (MISCELLANEOUS) IMPLANT
CLOTH BEACON ORANGE TIMEOUT ST (SAFETY) ×2 IMPLANT
CONT SPECI 4OZ STER CLIK (MISCELLANEOUS) ×4 IMPLANT
COVER TABLE BACK 60X90 (DRAPES) ×2 IMPLANT
CUFF TOURN SGL QUICK 34 (TOURNIQUET CUFF) ×2
CUFF TRNQT CYL 34X4X40X1 (TOURNIQUET CUFF) IMPLANT
Carticel Implant ×3 IMPLANT
DRAPE ARTHROSCOPY W/POUCH 114 (DRAPES) ×2 IMPLANT
DRAPE INCISE IOBAN 66X45 STRL (DRAPES) ×2 IMPLANT
DRAPE LG THREE QUARTER DISP (DRAPES) ×4 IMPLANT
DRAPE OEC MINIVIEW 54X84 (DRAPES) ×2 IMPLANT
DRAPE ORTHO SPLIT 77X108 STRL (DRAPES) ×4
DRAPE POUCH INSTRU U-SHP 10X18 (DRAPES) ×2 IMPLANT
DRAPE SURG ORHT 6 SPLT 77X108 (DRAPES) ×2 IMPLANT
DRAPE U-SHAPE 47X51 STRL (DRAPES) ×2 IMPLANT
DRAPE WARM FLUID 44X44 (DRAPE) ×2 IMPLANT
DRESSING TELFA 8X3 (GAUZE/BANDAGES/DRESSINGS) ×2 IMPLANT
DURAPREP 26ML APPLICATOR (WOUND CARE) ×2 IMPLANT
ELECT NDL TIP 2.8 STRL (NEEDLE) ×1 IMPLANT
ELECT NEEDLE TIP 2.8 STRL (NEEDLE) ×2 IMPLANT
ELECT REM PT RETURN 9FT ADLT (ELECTROSURGICAL) ×2
ELECTRODE REM PT RTRN 9FT ADLT (ELECTROSURGICAL) ×1 IMPLANT
EVACUATOR 1/8 PVC DRAIN (DRAIN) IMPLANT
EVACUATOR SILICONE 100CC (DRAIN) IMPLANT
GAUZE XEROFORM 1X8 LF (GAUZE/BANDAGES/DRESSINGS) ×2 IMPLANT
GEISTICH BIO-GIDE 40X50 (Mesh Specialty) ×2 IMPLANT
GLOVE BIO SURGEON STRL SZ 6.5 (GLOVE) ×1 IMPLANT
GLOVE BIO SURGEON STRL SZ7.5 (GLOVE) ×2 IMPLANT
GLOVE INDICATOR 6.5 STRL GRN (GLOVE) ×1 IMPLANT
GLOVE INDICATOR 8.0 STRL GRN (GLOVE) ×2 IMPLANT
GLOVE SURG ORTHO 8.0 STRL STRW (GLOVE) ×2 IMPLANT
GOWN PREVENTION PLUS LG XLONG (DISPOSABLE) ×2 IMPLANT
GOWN STRL REIN XL XLG (GOWN DISPOSABLE) ×4 IMPLANT
GRAFT ROPE FROZEN (Tissue) ×2 IMPLANT
GRAFT TISS ROPE SEMITEND 4-5.5 (Tissue) IMPLANT
IMMOBILIZER KNEE 20 (SOFTGOODS)
IMMOBILIZER KNEE 20 THIGH 36 (SOFTGOODS) IMPLANT
IMMOBILIZER KNEE 22 UNIV (SOFTGOODS) ×1 IMPLANT
IV NS IRRIG 3000ML ARTHROMATIC (IV SOLUTION) ×7 IMPLANT
KIT ESSENTIAL (MISCELLANEOUS) ×1 IMPLANT
KIT TRANSTIBIAL (DISPOSABLE) ×1 IMPLANT
KNEE WRAP E Z 3 GEL PACK (MISCELLANEOUS) ×2 IMPLANT
MEMBRANE GESTCH BIO-GIDE 40X50 (Mesh Specialty) IMPLANT
MINI VAC (SURGICAL WAND) IMPLANT
NDL SAFETY ECLIPSE 18X1.5 (NEEDLE) IMPLANT
NEEDLE HYPO 18GX1.5 SHARP (NEEDLE)
NEEDLE HYPO 22GX1.5 SAFETY (NEEDLE) IMPLANT
NS IRRIG 500ML POUR BTL (IV SOLUTION) ×2 IMPLANT
PACK ARTHROSCOPY DSU (CUSTOM PROCEDURE TRAY) ×2 IMPLANT
PACK BASIN DAY SURGERY FS (CUSTOM PROCEDURE TRAY) ×2 IMPLANT
PACK IMPLANT BIOCOMPOSITE MPFL (Orthopedic Implant) ×1 IMPLANT
PACK TOTAL JOINT (CUSTOM PROCEDURE TRAY) ×2 IMPLANT
PAD ABD 8X10 STRL (GAUZE/BANDAGES/DRESSINGS) ×2 IMPLANT
PADDING CAST ABS 4INX4YD NS (CAST SUPPLIES) ×1
PADDING CAST ABS COTTON 4X4 ST (CAST SUPPLIES) ×1 IMPLANT
PATTIES SURGICAL .5 X3 (DISPOSABLE) ×2 IMPLANT
PENCIL BUTTON HOLSTER BLD 10FT (ELECTRODE) ×2 IMPLANT
SET ARTHROSCOPY TUBING (MISCELLANEOUS) ×2
SET ARTHROSCOPY TUBING LN (MISCELLANEOUS) ×1 IMPLANT
SET PAD KNEE POSITIONER (MISCELLANEOUS) ×2 IMPLANT
SPEAR EYE SURGICAL ST (MISCELLANEOUS) ×3 IMPLANT
SPONGE GAUZE 2X2 12PLY UNSTER (GAUZE/BANDAGES/DRESSINGS) ×1 IMPLANT
SPONGE GAUZE 4X4 12PLY (GAUZE/BANDAGES/DRESSINGS) ×4 IMPLANT
SPONGE LAP 18X18 X RAY DECT (DISPOSABLE) ×2 IMPLANT
SPONGE LAP 4X18 X RAY DECT (DISPOSABLE) ×4 IMPLANT
SPONGE SURGIFOAM ABS GEL 100 (HEMOSTASIS) ×2 IMPLANT
STOCKINETTE 6  STRL (DRAPES) ×1
STOCKINETTE 6 STRL (DRAPES) ×1 IMPLANT
STRIP CLOSURE SKIN 1/2X4 (GAUZE/BANDAGES/DRESSINGS) ×2 IMPLANT
SUCTION FRAZIER TIP 10 FR DISP (SUCTIONS) ×2 IMPLANT
SUT 2 FIBERLOOP 20 STRT BLUE (SUTURE) ×4
SUT BONE WAX W31G (SUTURE) ×1 IMPLANT
SUT ETHILON 4 0 PS 2 18 (SUTURE) ×2 IMPLANT
SUT FIBERWIRE #2 38 T-5 BLUE (SUTURE) ×2
SUT MNCRL AB 3-0 PS2 18 (SUTURE) ×4 IMPLANT
SUT VIC AB 0 CT1 36 (SUTURE) ×3 IMPLANT
SUT VIC AB 0 CT2 27 (SUTURE) ×10 IMPLANT
SUT VIC AB 1 CT1 27 (SUTURE) ×14
SUT VIC AB 1 CT1 27XBRD ANTBC (SUTURE) ×7 IMPLANT
SUT VIC AB 2-0 CT1 27 (SUTURE) ×2
SUT VIC AB 2-0 CT1 TAPERPNT 27 (SUTURE) ×1 IMPLANT
SUTURE 2 FIBERLOOP 20 STRT BLU (SUTURE) ×4 IMPLANT
SUTURE FIBERWR #2 38 T-5 BLUE (SUTURE) IMPLANT
SUTURE VICRYL 6-0 P3 J492 IMPLANT
SYR 20CC LL (SYRINGE) ×2 IMPLANT
SYR BULB IRRIGATION 50ML (SYRINGE) ×2 IMPLANT
SYR CONTROL 10ML LL (SYRINGE) ×2 IMPLANT
SYR TB 1ML 27GX1/2 SAFE (SYRINGE) ×1 IMPLANT
SYR TB 1ML 27GX1/2 SAFETY (SYRINGE) ×2
TAPE STRIPS DRAPE STRL (GAUZE/BANDAGES/DRESSINGS) ×2 IMPLANT
TOWEL OR 17X24 6PK STRL BLUE (TOWEL DISPOSABLE) ×2 IMPLANT
TOWEL OR 17X26 10 PK STRL BLUE (TOWEL DISPOSABLE) ×4 IMPLANT
TRAY FOLEY CATH 14FRSI W/METER (CATHETERS) ×2 IMPLANT
TUBE CONNECTING 12X1/4 (SUCTIONS) ×2 IMPLANT
WAND 30 DEG SABER W/CORD (SURGICAL WAND) ×1 IMPLANT
WAND 90 DEG TURBOVAC W/CORD (SURGICAL WAND) IMPLANT
WATER STERILE IRR 500ML POUR (IV SOLUTION) ×3 IMPLANT
YANKAUER SUCT BULB TIP NO VENT (SUCTIONS) IMPLANT

## 2013-01-05 NOTE — Anesthesia Procedure Notes (Addendum)
Procedure Name: LMA Insertion Performed by: Briant Sites Pre-anesthesia Checklist: Patient identified, Emergency Drugs available, Suction available and Patient being monitored Patient Re-evaluated:Patient Re-evaluated prior to inductionOxygen Delivery Method: Circle System Utilized Preoxygenation: Pre-oxygenation with 100% oxygen Intubation Type: IV induction Ventilation: Mask ventilation without difficulty LMA: LMA with gastric port inserted LMA Size: 5.0 Number of attempts: 1 Placement Confirmation: positive ETCO2 Tube secured with: Tape Dental Injury: Teeth and Oropharynx as per pre-operative assessment     Procedure Name: Intubation Date/Time: 01/05/2013 11:50 AM Performed by: Maris Berger T Pre-anesthesia Checklist: Patient identified, Emergency Drugs available, Suction available and Patient being monitored Patient Re-evaluated:Patient Re-evaluated prior to inductionOxygen Delivery Method: Circle System Utilized Preoxygenation: Pre-oxygenation with 100% oxygen Intubation Type: IV induction Ventilation: Mask ventilation without difficulty Laryngoscope Size: Miller and 4 Grade View: Grade I Tube type: Oral Number of attempts: 1 Airway Equipment and Method: stylet and oral airway Placement Confirmation: ETT inserted through vocal cords under direct vision,  positive ETCO2 and breath sounds checked- equal and bilateral Secured at: 22 cm Tube secured with: Tape Dental Injury: Teeth and Oropharynx as per pre-operative assessment

## 2013-01-05 NOTE — Op Note (Signed)
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 Postoperative diagnosis same Procedure 1 right knee autologous chondrocyte implantation, Carticel procedure #2 open allograft medial patellofemoral ligament reconstruction #3 right knee arthroscopy with chondroplasty and arthroscopic lateral release Surgeon Valma Cava M.D. Assistant Marciano Sequin PACU General anesthesia Dr. Rica Mast Estimated blood loss less than 100 cc Drains none Complications none Tourniquet time 20 minutes at 250 mm mercury disposition PACU stable  Intraoperatively the patient had decreased 02 prior to tourniquet inflation and exsanguination. This treated by the anesthesia staff see their note. He responded well he we proceed with the surgical procedure after consulting with Dr. Rica Mast. He was remained stable for the operative procedure except one time after fit nail Chest wall rigidity that was also treated appropriately. The patient was transported from the operating room to the PACU in stable condition having monitored closely and intervene as necessary.  Operative details She was counseled in the holding area cracks I. was identified. Family was talked to. Taken operating room placed under general anesthesia. Options were well padded and bumped. It is history of previous PE he actually taking his Lovenox dose the morning of surgery despite being told not to get down in any way that we did I can femoral nerve block. And also with the Carticel per cells already here we decided to procedure PAS stockings were also applied to the posterior atenolol TED hose for DVT prophylaxis.  Rightmost edges elevated prepped with DuraPrep draped into a sterile fashion time out was done and confirmed exsanguinated Esmarch inflated to 300]. 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 Souped approximately lateral gutters unremarkable anterior cruciate ligament PCL intact to partial cuff normal II torn lateral meniscus and a Sutton which partially meniscus  A light chondroplasty for the patella very minimal. Distractor lateral release had undergone as previously pass a very modified and very careful lateral release was performed up to the scan Ceptaz sherry at this level patella but did not decrease the subluxation. Using a you as [place] was removed. Had undergone a previous PFL repair and eschar medial side based on that that all scar incorporate into a medial parapatellar incision again after the subcutaneous tissue skin fashion appropriately and a medial parapatellar arthrotomy was performed Newsom the or suture. Also advanced the VMO that meticulously taken down for later repair. The patella was in turn from side 9 but was not everted totally. The defect was measured cut and measured 2 x 2 centimeters in the a marking pen an inch below sharp curet his and taken down to the subchondral plate circumferentially denies or shelf of of cartilage except on the most inferior aspect. Will shoulder except that region. Calcified cartilage layer was removed. Tourniquet was deflated hemostasis was obtained and sublimis soft tissue in addition to the defect. On the back table and there.today for outline of the defect convert this into a bio guide matching template then mixed that one of the bowels of cells and confirmed with the patient at this to 7 to the bio guide. In the defect to the defect in the meticulous sewn with 6-0 Vicryl circumferentially. It VAC device closure there is no soft tissue envelope adjacent to it. I will do  Circumferentially placed about a  dry Margaret watertight  seal. The remaining 2 vials of the next problem the back table the remaining 2 bowel cells are then inserted into the defect close another suture 5 include. No excellent coverage of the defect which was 2 x 2 centimeters. Left hip in sales tells and reduced anatomically meticulous and being very careful.  Attention directed to the medial border patella to starting hole leg appendectomy and replaced 5 mm holes of sockets were then drilled we also chose the appropriate size Bacillus allograft back table this in addition to a dorsal 75 Arthrex bio composite screw lock anchors initially placed into the patella. The abductor tubercle the medial femoral condyle and the origin of the hip if it was identified and then soft tissue vessels were coagulated incorporate and also tied off there. Also noted in this procedure made suture as was performed without a tourniquet. We flexed the guidepin was then drilled from the anatomic origin in PFL proximal anterior collateral soft tissue drill socket and pulled and.the graft into the socket and 30 was of flexion meticulously held and the patella anatomically reduced and fixed this with an interference screw bio composite. It was anatomically reduced and stable. Prior placement the graft had also closed the synovium with running Vicryl suture for nice layer of tissue and is outside the graft and there was also leg is in the in zone 2. Prior to closure Vicryl suture. Subcutaneous Vicryl skin with a subcuticular Monocryl suture portal sites closed in nylon in the case of 50 cc was mixed with 20 cc of esperal and 30 cc of 0.25% Marcaine with epinephrine. Was injected into the arthrotomy made sure they did not get into the joint subcutaneous and skin region. Sterile dressing was applied. Circulation was intact in the case compressive wrap was applied TED hose knee immobilizer. Was awakened taken operative Jordan condition. He monitored closely in the PACU intravenous necessary. Be  kept in overnight.  Help with patient positioning prepping draping technical and surgical assistance throughout the entire case graft preparation wound closure compression dressing and splint Mr. Marciano Sequin PACU since was needed the entire case

## 2013-01-05 NOTE — Progress Notes (Signed)
PHARMACIST - PHYSICIAN COMMUNICATION  Re: Required anticoagulation labs for patients taking warfarin  A new warfarin order has been entered for this patient.    For compliance with The Joint Commission National Patient Safety Goals, all hospitalized patients on warfarin must have a baseline or current protime prior to the administration of warfarin.  In addition, a daily protime for three days is required.  This patient's warfarin has been on hold for surgery with the last dose given on 12/30/12.  Because the plan is for discharge home on post-op day 1, a protime has been entered for 01/06/13 only -- to comply with this policy.  The Pharmacy will defer to the prescriber for all warfarin dose changes and interpretation of lab results unless an additional order to initiate a Pharmacy Coumadin Protocol  is placed.  Andre Cervantes R.Ph. 01/05/2013 6:06 PM

## 2013-01-05 NOTE — H&P (View-Only) (Signed)
Spoke with Mother-Instructed Npo after Mn-including chewing tobacco.Istat,Ekg on arrival-will take lovenox,atenolol omeprazole with small amt water.To arrive at 0830.

## 2013-01-05 NOTE — Transfer of Care (Signed)
Immediate Anesthesia Transfer of Care Note  Patient: Andre Cervantes  Procedure(s) Performed: Procedure(s): KNEE ARTHROSCOPY WITH DEBRIDEMENT, LATERAL RELEASE, CARTICEL PROCEDURE, OPEN ALLOGRAFT (Right) MEDIAL PATELLA FEMORAL LIGAMENT RECONSTRUCTION (Right)  Patient Location: PACU  Anesthesia Type:General  Level of Consciousness: sedated  Airway & Oxygen Therapy: Patient Spontanous Breathing and Patient connected to face mask oxygen, opa inplace  Post-op Assessment: Report given to PACU RN  Post vital signs: Reviewed and stable  Complications: adverse drug reaction, severe chest wall rigidity with Fentanyl while in OR.  Responding now, no sequelae.

## 2013-01-05 NOTE — Progress Notes (Signed)
Patient ID: Andre Cervantes, male   DOB: December 14, 1985, 27 y.o.   MRN: [id number]  Intra operative note:  Pt was observed by anesthesia to destat to the 80's. Prior to tunquit inflation.  Stats quickly resolved with anesthesia treatment. Another similar episode occurred approximately half way through the case. Possiblely related to Fentanyl. Patient does have a history of blood clots. Comandin to Lovenox bridge prior to surgery. Last Lovenox dose this am 0500. Otherwise patient stable throughout case. Transfered to PACU without complications. Will monitor overnight. Follow anesthesia recommendations.

## 2013-01-05 NOTE — Anesthesia Postprocedure Evaluation (Signed)
  Anesthesia Post-op Note  Patient: Andre Cervantes  Procedure(s) Performed: Procedure(s) (LRB): KNEE ARTHROSCOPY WITH DEBRIDEMENT, LATERAL RELEASE, CARTICEL PROCEDURE, OPEN ALLOGRAFT (Right) MEDIAL PATELLA FEMORAL LIGAMENT RECONSTRUCTION (Right)  Patient Location: PACU  Anesthesia Type: General  Level of Consciousness: awake and alert   Airway and Oxygen Therapy: Patient Spontanous Breathing  Post-op Pain: mild  Post-op Assessment: Post-op Vital signs reviewed, Patient's Cardiovascular Status Stable, Respiratory Function Stable, Patent Airway and No signs of Nausea or vomiting  Last Vitals:  Filed Vitals:   01/05/13 1500  BP: 127/64  Pulse: 103  Temp:   Resp: 21    Post-op Vital Signs: stable   Complications: No apparent anesthesia complications

## 2013-01-05 NOTE — Interval H&P Note (Signed)
History and Physical Interval Note:  01/05/2013 10:56 AM  Andre Cervantes  has presented today for surgery, with the diagnosis of RIGHT KNEE CHONDRAL DEFECTS/RECURRENT PATILLA DISLOCATION  The various methods of treatment have been discussed with the patient and family. After consideration of risks, benefits and other options for treatment, the patient has consented to  Procedure(s): KNEE ARTHROSCOPY WITH DEBRIDEMENT, LATERAL RELEASE, CARTICEL PROCEDURE, OPEN ALLOGRAFT (Right) MEDIAL PATELLA FEMORAL LIGAMENT RECONSTRUCTION (Right) as a surgical intervention .  The patient's history has been reviewed, patient examined, no change in status, stable for surgery.  I have reviewed the patient's chart and labs.  Questions were answered to the patient's satisfaction.     [name] ANDREW

## 2013-01-05 NOTE — Progress Notes (Signed)
Patient ID: Andre Cervantes, male   DOB: 12/09/1985, 27 y.o.   MRN: [id number]  Patient seen and examined this afternoon. Denies, CP, SOB, or palpations. Soreness of right knee.    VSS. SPo2 96%.   S/P MPFL and ACI:  Doing well Continue current care. D/C home tomorrow.

## 2013-01-05 NOTE — Anesthesia Preprocedure Evaluation (Addendum)
Anesthesia Evaluation  Patient identified by MRN, date of birth, ID band Patient awake    Reviewed: Allergy & Precautions, H&P , NPO status , Patient's Chart, lab work & pertinent test results  Airway Mallampati: II TM Distance: >3 FB Neck ROM: Full    Dental  (+) Poor Dentition, Dental Advisory Given and Chipped   Pulmonary PE Hx of PE in past breath sounds clear to auscultation        Cardiovascular hypertension, Pt. on home beta blockers and Pt. 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 Denies tobacco use since midnight.  Reproductive/Obstetrics                      Anesthesia Physical Anesthesia Plan  ASA: II  Anesthesia Plan: General   Post-op Pain Management:    Induction: Intravenous  Airway Management Planned: Oral ETT  Additional Equipment:   Intra-op Plan:   Post-operative Plan: Extubation in OR  Informed Consent: I have reviewed the patients History and Physical, chart, labs and discussed the procedure including the risks, benefits and alternatives for the proposed anesthesia with the patient or authorized representative who has indicated his/her understanding and acceptance.     Plan Discussed with: CRNA  Anesthesia Plan Comments: (Surgeon made aware of SQ Lovenox administered by patient this AM. )     Anesthesia Quick Evaluation

## 2013-01-05 NOTE — H&P (Signed)
Andre Cervantes is an 27 y.o. male.   Chief Complaint: Right knee pain HPI: Patient presents today for MPFL reconstruction and ACI at [facility] surgery center. 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 Despite conservative and surgical treatment symptoms continue. Risk and benefits were discussed in detailed. Patient and family wish to proceed as consented. Denies CP, SOB, or calf pain. No Fever or chills.  Past Medical History  Diagnosis Date  . ADD (attention deficit disorder)   . GERD (gastroesophageal reflux disease)   . Hypertension   . DVT (deep venous thrombosis) 2013    Past Surgical History  Procedure Laterality Date  . Knee surgery Bilateral 2013    rt knee  . Fracture surgery    . Rt hand little finger pinning  childhood    History reviewed. No pertinent family history. Social History:  reports that he has never smoked. His smokeless tobacco use includes Chew. He reports that he uses illicit drugs (Other-see comments). He reports that he does not drink alcohol.  Allergies:  Allergies  Allergen Reactions  . Morphine Swelling    Medications Prior to Admission  Medication Sig Dispense Refill  . amitriptyline (ELAVIL) 25 MG tablet Take 25 mg by mouth at bedtime.      Marland Kitchen atenolol (TENORMIN) 50 MG tablet Take 50 mg by mouth daily.      Marland Kitchen buPROPion (WELLBUTRIN XL) 150 MG 24 hr tablet Take 450 mg by mouth daily.      Marland Kitchen enoxaparin (LOVENOX) 30 MG/0.3ML injection Inject 30 mg into the skin every 12 (twelve) hours.      . fenofibrate (TRICOR) 145 MG tablet Take 145 mg by mouth daily. 134 mg per mother      . loratadine (CLARITIN) 10 MG tablet Take 10 mg by mouth daily.      Marland Kitchen lovastatin (MEVACOR) 20 MG tablet Take 20 mg by mouth at bedtime.      Marland Kitchen omeprazole (PRILOSEC) 20 MG capsule Take 20 mg by mouth 2 (two) times daily.      . risperiDONE (RISPERDAL) 1 MG tablet Take 1 mg by mouth at bedtime.      . tamsulosin (FLOMAX) 0.4 MG CAPS capsule Take 0.4 mg by mouth.      .  pravastatin (PRAVACHOL) 80 MG tablet Take 80 mg by mouth at bedtime.      Marland Kitchen warfarin (COUMADIN) 5 MG tablet Take 1.5 tablets (7.5 mg total) by mouth daily.  45 tablet  0    Results for orders placed during the hospital encounter of 01/05/13 (from the past 48 hour(s))  POCT I-STAT 4, (NA,K, GLUC, HGB,HCT)     Status: None   Collection Time    01/05/13 10:03 AM      Result Value Range   Sodium 140  135 - 145 mEq/L   Potassium 4.1  3.5 - 5.1 mEq/L   Glucose, Bld 88  70 - 99 mg/dL   HCT 16.1  09.6 - 04.5 %   Hemoglobin 15.3  13.0 - 17.0 g/dL   No results found.  Review of Systems  Constitutional: Negative.   HENT: Negative.   Eyes: Negative.   Respiratory: Negative.   Cardiovascular: Negative.   Gastrointestinal: Negative.   Genitourinary: Negative.   Musculoskeletal: Positive for joint pain.  Skin: Negative.   Neurological: Negative.   Endo/Heme/Allergies: Negative.   Psychiatric/Behavioral: Negative.     Blood pressure 125/86, pulse 66, temperature 97 F (36.1 C), temperature source Oral, resp. rate 16,  weight 87.771 kg (193 lb 8 oz), SpO2 100.00%. Physical Exam  Constitutional: He is oriented to person, place, and time. He appears well-nourished.  HENT:  Head: Normocephalic.  Eyes: EOM are normal.  Neck: Normal range of motion.  Cardiovascular: Normal rate, regular rhythm, normal heart sounds and intact distal pulses.   Respiratory: Effort normal and breath sounds normal.  GI: Soft. Bowel sounds are normal.  Genitourinary:  Deferred  Musculoskeletal: He exhibits edema and tenderness.  Right knee  Neurological: He is alert and oriented to person, place, and time.  Skin: Skin is warm and dry.  Psychiatric: His behavior is normal.     Assessment/Plan: Right Knee MPFL Reconstruction and ACI procedure Stay overnight for obs and pain management. D/c Home in AM Lovenox bridge  Follow up in office in 3-5 days, call [phone] Follow D/c instructions Take medications as  directed    Clara Smolen L 01/05/2013, 10:40 AM

## 2013-01-06 DIAGNOSIS — Z9889 Other specified postprocedural states: Secondary | ICD-10-CM

## 2013-01-06 HISTORY — DX: Other specified postprocedural states: Z98.890

## 2013-01-06 LAB — CBC
HCT: 38.3 % — ABNORMAL LOW (ref 39.0–52.0)
MCV: 91.6 fL (ref 78.0–100.0)
Platelets: 365 10*3/uL (ref 150–400)
RBC: 4.18 MIL/uL — ABNORMAL LOW (ref 4.22–5.81)
RDW: 12.4 % (ref 11.5–15.5)
WBC: 18.9 10*3/uL — ABNORMAL HIGH (ref 4.0–10.5)

## 2013-01-06 LAB — PROTIME-INR
INR: 1.02 (ref 0.00–1.49)
Prothrombin Time: 13.2 seconds (ref 11.6–15.2)

## 2013-01-06 MED ORDER — OXYCODONE-ACETAMINOPHEN 5-325 MG PO TABS: 1.0000 | ORAL_TABLET | ORAL | Status: DC | PRN

## 2013-01-06 MED ORDER — METHOCARBAMOL 500 MG PO TABS: 500.0000 mg | ORAL_TABLET | Freq: Four times a day (QID) | ORAL | Status: DC | PRN

## 2013-01-06 MED ORDER — ENOXAPARIN SODIUM 30 MG/0.3ML ~~LOC~~ SOLN: 30.0000 mg | Freq: Two times a day (BID) | SUBCUTANEOUS | Status: DC

## 2013-01-06 NOTE — Progress Notes (Signed)
Subjective: 1 Day Post-Op Procedure(s) (LRB): KNEE ARTHROSCOPY WITH DEBRIDEMENT, LATERAL RELEASE, CARTICEL PROCEDURE, OPEN ALLOGRAFT (Right) MEDIAL PATELLA FEMORAL LIGAMENT RECONSTRUCTION (Right) Patient reports pain as well controlled.  Tolerating PO's and medications well. Sore to Right knee region. Denies Cp ,SOB, palpations, or calf pain. No N/V/C.  Objective: Vital signs in last 24 hours: Temp:  [97 F (36.1 C)-98.6 F (37 C)] 98.1 F (36.7 C) (12/12 0543) Pulse Rate:  [66-107] 89 (12/12 0543) Resp:  [16-21] 18 (12/12 0543) BP: (111-138)/(64-86) 111/69 mmHg (12/12 0543) SpO2:  [93 %-100 %] 94 % (12/12 0543) Weight:  [87.544 kg (193 lb)-87.771 kg (193 lb 8 oz)] 87.544 kg (193 lb) (12/11 2108)  Intake/Output from previous day: 12/11 0701 - 12/12 0700 In: 3420 [P.O.:360; I.V.:3060] Out: 2475 [Urine:2475] Intake/Output this shift:     Recent Labs  01/05/13 1003 01/06/13 0435  HGB 15.3 12.9*    Recent Labs  01/05/13 1003 01/06/13 0435  WBC  --  18.9*  RBC  --  4.18*  HCT 45.0 38.3*  PLT  --  365    Recent Labs  01/05/13 1003  NA 140  K 4.1  GLUCOSE 88    Recent Labs  01/06/13 0435  INR 1.02    Well nourished. Alert and oriented x3. RRR, Lungs clear, BS x4. Abdomen soft and non tender. Right Calf soft and non tender. Right knee dressing Changed. No DVT signs. Compartment soft. No signs of infection.  Right LE neurovascular intact.  Assessment/Plan: 1 Day Post-Op Procedure(s) (LRB): KNEE ARTHROSCOPY WITH DEBRIDEMENT, LATERAL RELEASE, CARTICEL PROCEDURE, OPEN ALLOGRAFT (Right) MEDIAL PATELLA FEMORAL LIGAMENT RECONSTRUCTION (Right) D/C home today. Follow ACI Instruction booklet Take medications as directed. Follow up with Dr. Thomasena Edis in 3-5 days. Follow up with Dr.Betts next week, for blood work   Leukocytosis: Post-op stress reaction.  Kolt Mcwhirter L 01/06/2013, 7:28 AM

## 2013-01-06 NOTE — Progress Notes (Signed)
Discharge instructions gone over with pt's father including following ACI booklet, meds,& follow up visits with Drs. [employer]

## 2013-01-06 NOTE — Discharge Summary (Signed)
Physician Discharge Summary  Patient ID: Andre Cervantes MRN: [id number] DOB/AGE: December 30, 1985 27 y.o.  Admit date: 01/05/2013 Discharge date: 01/06/2013  Admission Diagnoses: Right knee pain  Discharge Diagnoses:  Active Problems:   Status post knee surgery   S/P knee surgery   Discharged Condition: Stable  Hospital Course: Patient was admitted to [facility] surgery center for ACI and MPFL reconstruction. It planned for the patient to D/C home after surgery. Due to a drop in O2 stats and lack of a femoral block. We recommended observation for O2 stats and pain management. After leaving the Or in stable condition he arrived in PACU and eventually room 1608 without complication. Stable through the night and was fit for D/C home today with family support. Leukocytosis related to stress reaction from surgery. He is being bridged with Lovenox to Comandin. This is for a Hx of PE's. Patient denied CP, SOB, palpations, or calf pain.  Consults: n/A  Significant Diagnostic Studies: n/a  Treatments: Routine  Discharge Exam: Blood pressure 107/69, pulse 101, temperature 98 F (36.7 C), temperature source Oral, resp. rate 16, height 5\' 9"  (1.753 m), weight 87.544 kg (193 lb), SpO2 92.00%. Well nourished. Alert and oriented x3. RRR, Lungs clear, BS x4. Abdomen soft and non tender. Right Calf soft and non tender. Right knee dressing C/D/I. No DVT signs. Compartment soft. No signs of infection.  Right LE neurovascular intact.  Disposition: 01-Home or Self Care  Discharge Orders   Future Orders Complete By Expires   Call MD / Call 911  As directed    Comments:     If you experience chest pain or shortness of breath, CALL 911 and be transported to the hospital emergency room.  If you develope a fever above 101 F, pus (white drainage) or increased drainage or redness at the wound, or calf pain, call your surgeon's office.   Constipation Prevention  As directed    Comments:     Drink plenty of fluids.   Prune juice may be helpful.  You may use a stool softener, such as Colace (over the counter) 100 mg twice a day.  Use MiraLax (over the counter) for constipation as needed.   CPM  As directed    Comments:     Continuous passive motion machine (CPM):     Follow Carticel booklet for trochlea guidelines.   Diet - low sodium heart healthy  As directed    Discharge instructions  As directed    Comments:     Call and make an appointment for 3-5 days from now ([phone]). Leave dressing in place. Start home CPM per the booklet. Follow Carticel booklet instructions for trochlea guidelines.  Follow D/C instructions. Take medications as directed. Weight bear as tolerated with assistive devices, in the brace. Call [phone] with questions. Resume home Coumadin. Take Lovenox injection 30mg  twice a day for 7 days. Follow up with Dr.  Royal Hawthorn  Next week for blood work.   Do not put a pillow under the knee. Place it under the heel.  As directed    Increase activity slowly as tolerated  As directed    TED hose  As directed    Comments:     Use stockings (TED hose) for 2 weeks on bilateral leg(s).  You may remove them at night for sleeping.   Weight bearing as tolerated  As directed    Comments:     Remain in brace, except when in the CPM machine.   Questions:  Laterality:  right   Extremity:         Medication List         amitriptyline 25 MG tablet  Commonly known as:  ELAVIL  Take 25 mg by mouth at bedtime.     atenolol 50 MG tablet  Commonly known as:  TENORMIN  Take 50 mg by mouth daily.     buPROPion 150 MG 24 hr tablet  Commonly known as:  WELLBUTRIN XL  Take 450 mg by mouth daily.     enoxaparin 30 MG/0.3ML injection  Commonly known as:  LOVENOX  Inject 0.3 mLs (30 mg total) into the skin every 12 (twelve) hours.     enoxaparin 30 MG/0.3ML injection  Commonly known as:  LOVENOX  Inject 30 mg into the skin every 12 (twelve) hours.     fenofibrate micronized 134 MG capsule   Commonly known as:  LOFIBRA  Take 134 mg by mouth daily before breakfast.     loratadine 10 MG tablet  Commonly known as:  CLARITIN  Take 10 mg by mouth daily.     lovastatin 20 MG tablet  Commonly known as:  MEVACOR  Take 20 mg by mouth at bedtime.     methocarbamol 500 MG tablet  Commonly known as:  ROBAXIN  Take 1 tablet (500 mg total) by mouth every 6 (six) hours as needed for muscle spasms.     omeprazole 20 MG capsule  Commonly known as:  PRILOSEC  Take 20 mg by mouth 2 (two) times daily.     oxyCODONE-acetaminophen 5-325 MG per tablet  Commonly known as:  PERCOCET/ROXICET  Take 1-2 tablets by mouth every 4 (four) hours as needed for moderate pain.     pravastatin 80 MG tablet  Commonly known as:  PRAVACHOL  Take 80 mg by mouth at bedtime.     risperiDONE 1 MG tablet  Commonly known as:  RISPERDAL  Take 1 mg by mouth at bedtime.     tamsulosin 0.4 MG Caps capsule  Commonly known as:  FLOMAX  Take 0.4 mg by mouth.     warfarin 3 MG tablet  Commonly known as:  COUMADIN  Take 3-6 mg by mouth daily. Take 6 mg Tuesday, Wednesday, Thursday, Friday, Saturday, Sunday.  Take 3 mg on Monday.         SignedMarkham Jordan 01/06/2013, 1:22 PM

## 2013-01-12 ENCOUNTER — Encounter (HOSPITAL_BASED_OUTPATIENT_CLINIC_OR_DEPARTMENT_OTHER): Payer: Self-pay | Admitting: Specialist

## 2013-11-06 ENCOUNTER — Encounter: Payer: Self-pay | Admitting: Gastroenterology

## 2014-05-14 ENCOUNTER — Other Ambulatory Visit (HOSPITAL_COMMUNITY): Payer: Self-pay | Admitting: Neurosurgery

## 2014-06-02 NOTE — Pre-Procedure Instructions (Addendum)
Andre Cervantes  06/02/2014   Your procedure is scheduled on:  May 17  Report to [facility] Admitting at 05:30 AM.  Call this number if you have problems the morning of surgery: [phone]   Remember:   Do not eat food or drink liquids after midnight.   Take these medicines the morning of surgery with A SIP OF WATER: Tylenol (if needed), Atenolol, Gabapentin, Loratadine, Omeprazole, Flomax   Follow surgeons order regarding coumadin. Stop (06/07/14)   STOP/ Do not take Aspirin, Aleve, Naproxen, Advil, Ibuprofen, Motrin, Vitamins, Herbs, or Supplements starting May 10   Do not wear jewelry, make-up or nail polish.  Do not wear lotions, powders, or perfumes. You may wear deodorant.  Do not shave 48 hours prior to surgery. Men may shave face and neck.  Do not bring valuables to the hospital.  [facility] is not responsible for any belongings or valuables.               Contacts, dentures or bridgework may not be worn into surgery.  Leave suitcase in the car. After surgery it may be brought to your room.  For patients admitted to the hospital, discharge time is determined by your treatment team.               Special Instructions: [place] - Preparing for Surgery  Before surgery, you can play an important role.  Because skin is not sterile, your skin needs to be as free of germs as possible.  You can reduce the number of germs on you skin by washing with CHG (chlorahexidine gluconate) soap before surgery.  CHG is an antiseptic cleaner which kills germs and bonds with the skin to continue killing germs even after washing.  Please DO NOT use if you have an allergy to CHG or antibacterial soaps.  If your skin becomes reddened/irritated stop using the CHG and inform your nurse when you arrive at Short Stay.  Do not shave (including legs and underarms) for at least 48 hours prior to the first CHG shower.  You may shave your face.  Please follow these instructions  carefully:   1.  Shower with CHG Soap the night before surgery and the morning of Surgery.  2.  If you choose to wash your hair, wash your hair first as usual with your normal shampoo.  3.  After you shampoo, rinse your hair and body thoroughly to remove the shampoo.  4.  Use CHG as you would any other liquid soap.  You can apply CHG directly to the skin and wash gently with scrungie or a clean washcloth.  5.  Apply the CHG Soap to your body ONLY FROM THE NECK DOWN.  Do not use on open wounds or open sores.  Avoid contact with your eyes, ears, mouth and genitals (private parts).  Wash genitals (private parts) with your normal soap.  6.  Wash thoroughly, paying special attention to the area where your surgery will be performed.  7.  Thoroughly rinse your body with warm water from the neck down.  8.  DO NOT shower/wash with your normal soap after using and rinsing off the CHG Soap.  9.  Pat yourself dry with a clean towel.            10.  Wear clean pajamas.            11.  Place clean sheets on your bed the night of your first shower and do  not sleep with pets.  Day of Surgery  Do not apply any lotions the morning of surgery.  Please wear clean clothes to the hospital/surgery center.     Please read over the following fact sheets that you were given: Pain Booklet, Coughing and Deep Breathing, Blood Transfusion Information and Surgical Site Infection Prevention

## 2014-06-04 ENCOUNTER — Encounter (HOSPITAL_COMMUNITY)
Admission: RE | Admit: 2014-06-04 | Discharge: 2014-06-04 | Disposition: A | Discharge status: Home / Self Care | Payer: Medicare Other | Source: Ambulatory Visit | Attending: Neurosurgery | Admitting: Neurosurgery

## 2014-06-04 ENCOUNTER — Encounter (HOSPITAL_COMMUNITY): Payer: Self-pay

## 2014-06-04 DIAGNOSIS — Z01810 Encounter for preprocedural cardiovascular examination: Secondary | ICD-10-CM | POA: Diagnosis not present

## 2014-06-04 DIAGNOSIS — I10 Essential (primary) hypertension: Secondary | ICD-10-CM | POA: Diagnosis not present

## 2014-06-04 DIAGNOSIS — Z01812 Encounter for preprocedural laboratory examination: Secondary | ICD-10-CM | POA: Insufficient documentation

## 2014-06-04 HISTORY — DX: Other pulmonary embolism without acute cor pulmonale: I26.99

## 2014-06-04 HISTORY — DX: Unspecified osteoarthritis, unspecified site: M19.90

## 2014-06-04 LAB — TYPE AND SCREEN
ABO/RH(D): A POS
ANTIBODY SCREEN: NEGATIVE

## 2014-06-04 LAB — SURGICAL PCR SCREEN
MRSA, PCR: NEGATIVE
Staphylococcus aureus: POSITIVE — AB

## 2014-06-04 LAB — CBC
HCT: 44.4 % (ref 39.0–52.0)
Hemoglobin: 14.8 g/dL (ref 13.0–17.0)
MCH: 30.8 pg (ref 26.0–34.0)
MCHC: 33.3 g/dL (ref 30.0–36.0)
MCV: 92.3 fL (ref 78.0–100.0)
Platelets: 452 10*3/uL — ABNORMAL HIGH (ref 150–400)
RBC: 4.81 MIL/uL (ref 4.22–5.81)
RDW: 12.2 % (ref 11.5–15.5)
WBC: 7.8 10*3/uL (ref 4.0–10.5)

## 2014-06-04 LAB — PROTIME-INR
INR: 2.49 — AB (ref 0.00–1.49)
PROTHROMBIN TIME: 27.1 s — AB (ref 11.6–15.2)

## 2014-06-04 LAB — COMPREHENSIVE METABOLIC PANEL
ALT: 70 U/L — ABNORMAL HIGH (ref 17–63)
ANION GAP: 9 (ref 5–15)
AST: 60 U/L — ABNORMAL HIGH (ref 15–41)
Albumin: 4.1 g/dL (ref 3.5–5.0)
Alkaline Phosphatase: 33 U/L — ABNORMAL LOW (ref 38–126)
BUN: 13 mg/dL (ref 6–20)
CALCIUM: 9.5 mg/dL (ref 8.9–10.3)
CO2: 22 mmol/L (ref 22–32)
Chloride: 107 mmol/L (ref 101–111)
Creatinine, Ser: 1.31 mg/dL — ABNORMAL HIGH (ref 0.61–1.24)
GFR calc non Af Amer: >60 mL/min (ref >60)
GLUCOSE: 102 mg/dL — AB (ref 70–99)
Potassium: 4.2 mmol/L (ref 3.5–5.1)
Sodium: 138 mmol/L (ref 135–145)
Total Bilirubin: 0.5 mg/dL (ref 0.3–1.2)
Total Protein: 6.9 g/dL (ref 6.5–8.1)

## 2014-06-04 LAB — APTT: APTT: 35 s (ref 24–37)

## 2014-06-04 LAB — ABO/RH: ABO/RH(D): A POS

## 2014-06-04 NOTE — Progress Notes (Signed)
   06/04/14 0929  OBSTRUCTIVE SLEEP APNEA  Have you ever been diagnosed with sleep apnea through a sleep study? No  Do you snore loudly (loud enough to be heard through closed doors)?  1  Do you often feel tired, fatigued, or sleepy during the daytime? 0  Has anyone observed you stop breathing during your sleep? 1 (occ)  Do you have, or are you being treated for high blood pressure? 1  BMI more than 35 kg/m2? 0  Age over 28 years old? 0  Neck circumference greater than 40 cm/16 inches? 0 (16)  Gender: 1  Obstructive Sleep Apnea Score 4

## 2014-06-05 NOTE — Progress Notes (Signed)
Anesthesia Chart Review:  Pt is 29 year old male scheduled for L5-S1 PLIF on 06/12/2014 with Dr. Hal Neer.   PMH includes: HTN, DVT (2013), PE (2013), ADD, GERD. Never smoker. BMI 29. S/p knee arthroscopy 01/05/13.   Medications include: amitriptyline, atenolol, gabapentin, risperdal, flomax, coumadin.  Coumadin to be stopped 06/07/2014.   Preoperative labs reviewed.  PT/INR 27.1/2.49.  Will repeat PT DOS.   EKG: NSR.   If PT acceptable DOS, I anticipate pt can proceed as scheduled.   Willeen Cass, FNP-BC [facility] Short Stay Surgical Center/Anesthesiology Phone: [phone] 06/05/2014 4:40 PM

## 2014-06-11 MED ORDER — CEFAZOLIN SODIUM-DEXTROSE 2-3 GM-% IV SOLR
2.0000 g | INTRAVENOUS | Status: AC
  Administered 2014-06-12: 2 g via INTRAVENOUS

## 2014-06-12 ENCOUNTER — Inpatient Hospital Stay (HOSPITAL_COMMUNITY)
Admission: RE | Admit: 2014-06-12 | Discharge: 2014-06-19 | DRG: 459 | Disposition: A | Discharge status: Home / Self Care | Payer: Medicare Other | Source: Ambulatory Visit | Attending: Neurosurgery | Admitting: Neurosurgery

## 2014-06-12 ENCOUNTER — Inpatient Hospital Stay (HOSPITAL_COMMUNITY): Payer: Medicare Other | Admitting: Emergency Medicine

## 2014-06-12 ENCOUNTER — Encounter (HOSPITAL_COMMUNITY): Payer: Self-pay | Admitting: *Deleted

## 2014-06-12 ENCOUNTER — Inpatient Hospital Stay (HOSPITAL_COMMUNITY): Payer: Medicare Other | Admitting: Certified Registered Nurse Anesthetist

## 2014-06-12 ENCOUNTER — Encounter (HOSPITAL_COMMUNITY)
Admission: RE | Disposition: A | Discharge status: Home / Self Care | Payer: Self-pay | Source: Ambulatory Visit | Attending: Neurosurgery

## 2014-06-12 ENCOUNTER — Inpatient Hospital Stay (HOSPITAL_COMMUNITY): Payer: Medicare Other

## 2014-06-12 DIAGNOSIS — Z86718 Personal history of other venous thrombosis and embolism: Secondary | ICD-10-CM

## 2014-06-12 DIAGNOSIS — M4326 Fusion of spine, lumbar region: Secondary | ICD-10-CM

## 2014-06-12 DIAGNOSIS — M4306 Spondylolysis, lumbar region: Secondary | ICD-10-CM

## 2014-06-12 DIAGNOSIS — I2699 Other pulmonary embolism without acute cor pulmonale: Secondary | ICD-10-CM | POA: Diagnosis not present

## 2014-06-12 DIAGNOSIS — R0902 Hypoxemia: Secondary | ICD-10-CM | POA: Diagnosis not present

## 2014-06-12 DIAGNOSIS — M4317 Spondylolisthesis, lumbosacral region: Principal | ICD-10-CM | POA: Diagnosis present

## 2014-06-12 DIAGNOSIS — I10 Essential (primary) hypertension: Secondary | ICD-10-CM | POA: Diagnosis present

## 2014-06-12 DIAGNOSIS — Z7901 Long term (current) use of anticoagulants: Secondary | ICD-10-CM

## 2014-06-12 DIAGNOSIS — R339 Retention of urine, unspecified: Secondary | ICD-10-CM | POA: Diagnosis not present

## 2014-06-12 DIAGNOSIS — M549 Dorsalgia, unspecified: Secondary | ICD-10-CM | POA: Diagnosis present

## 2014-06-12 DIAGNOSIS — K219 Gastro-esophageal reflux disease without esophagitis: Secondary | ICD-10-CM | POA: Diagnosis present

## 2014-06-12 DIAGNOSIS — Y95 Nosocomial condition: Secondary | ICD-10-CM | POA: Diagnosis not present

## 2014-06-12 DIAGNOSIS — F17220 Nicotine dependence, chewing tobacco, uncomplicated: Secondary | ICD-10-CM | POA: Diagnosis present

## 2014-06-12 DIAGNOSIS — R0602 Shortness of breath: Secondary | ICD-10-CM | POA: Diagnosis not present

## 2014-06-12 DIAGNOSIS — J189 Pneumonia, unspecified organism: Secondary | ICD-10-CM | POA: Diagnosis not present

## 2014-06-12 DIAGNOSIS — Z86711 Personal history of pulmonary embolism: Secondary | ICD-10-CM | POA: Diagnosis not present

## 2014-06-12 DIAGNOSIS — J9601 Acute respiratory failure with hypoxia: Secondary | ICD-10-CM | POA: Diagnosis not present

## 2014-06-12 DIAGNOSIS — A419 Sepsis, unspecified organism: Secondary | ICD-10-CM | POA: Diagnosis not present

## 2014-06-12 HISTORY — DX: Spondylolysis, lumbar region: M43.06

## 2014-06-12 LAB — PROTIME-INR
INR: 1.08 (ref 0.00–1.49)
Prothrombin Time: 14.2 seconds (ref 11.6–15.2)

## 2014-06-12 SURGERY — POSTERIOR LUMBAR FUSION 1 LEVEL
Anesthesia: General | Site: Back

## 2014-06-12 MED ORDER — PROPOFOL 10 MG/ML IV BOLUS
INTRAVENOUS | Status: AC
  Filled 2014-06-12: qty 20

## 2014-06-12 MED ORDER — HYDROMORPHONE HCL 1 MG/ML IJ SOLN
1.0000 mg | INTRAMUSCULAR | Status: DC | PRN
  Administered 2014-06-12 – 2014-06-13 (×2): 1 mg via INTRAMUSCULAR
  Filled 2014-06-12: qty 1

## 2014-06-12 MED ORDER — ONDANSETRON HCL 4 MG/2ML IJ SOLN: 4.0000 mg | INTRAMUSCULAR | Status: DC | PRN

## 2014-06-12 MED ORDER — ARTIFICIAL TEARS OP OINT
TOPICAL_OINTMENT | OPHTHALMIC | Status: AC
  Filled 2014-06-12: qty 3.5

## 2014-06-12 MED ORDER — MENTHOL 3 MG MT LOZG: 1.0000 | LOZENGE | OROMUCOSAL | Status: DC | PRN

## 2014-06-12 MED ORDER — MEPERIDINE HCL 25 MG/ML IJ SOLN: 6.2500 mg | INTRAMUSCULAR | Status: DC | PRN

## 2014-06-12 MED ORDER — GLYCOPYRROLATE 0.2 MG/ML IJ SOLN
INTRAMUSCULAR | Status: AC
  Filled 2014-06-12: qty 3

## 2014-06-12 MED ORDER — SODIUM CHLORIDE 0.9 % IV SOLN: 250.0000 mL | INTRAVENOUS | Status: DC

## 2014-06-12 MED ORDER — LIDOCAINE HCL (CARDIAC) 20 MG/ML IV SOLN
INTRAVENOUS | Status: DC | PRN
  Administered 2014-06-12: 40 mg via INTRAVENOUS

## 2014-06-12 MED ORDER — ROCURONIUM BROMIDE 50 MG/5ML IV SOLN
INTRAVENOUS | Status: AC
  Filled 2014-06-12: qty 1

## 2014-06-12 MED ORDER — DEXAMETHASONE SODIUM PHOSPHATE 4 MG/ML IJ SOLN
4.0000 mg | Freq: Four times a day (QID) | INTRAMUSCULAR | Status: AC
  Administered 2014-06-12: 4 mg via INTRAVENOUS
  Filled 2014-06-12: qty 1

## 2014-06-12 MED ORDER — VANCOMYCIN HCL 1000 MG IV SOLR
INTRAVENOUS | Status: DC | PRN
  Administered 2014-06-12: 1000 mg

## 2014-06-12 MED ORDER — LIDOCAINE HCL (CARDIAC) 20 MG/ML IV SOLN
INTRAVENOUS | Status: AC
  Filled 2014-06-12: qty 5

## 2014-06-12 MED ORDER — BISACODYL 5 MG PO TBEC
5.0000 mg | DELAYED_RELEASE_TABLET | Freq: Every day | ORAL | Status: DC | PRN
  Filled 2014-06-12: qty 1

## 2014-06-12 MED ORDER — 0.9 % SODIUM CHLORIDE (POUR BTL) OPTIME
TOPICAL | Status: DC | PRN
  Administered 2014-06-12: 1000 mL

## 2014-06-12 MED ORDER — VANCOMYCIN HCL 1000 MG IV SOLR
INTRAVENOUS | Status: AC
  Filled 2014-06-12: qty 1000

## 2014-06-12 MED ORDER — BUPIVACAINE LIPOSOME 1.3 % IJ SUSP
INTRAMUSCULAR | Status: DC | PRN
  Administered 2014-06-12: 20 mL

## 2014-06-12 MED ORDER — AMITRIPTYLINE HCL 50 MG PO TABS
50.0000 mg | ORAL_TABLET | Freq: Every day | ORAL | Status: DC
  Administered 2014-06-12 – 2014-06-18 (×7): 50 mg via ORAL
  Filled 2014-06-12 (×2): qty 1
  Filled 2014-06-12: qty 2
  Filled 2014-06-12 (×3): qty 1
  Filled 2014-06-12: qty 2
  Filled 2014-06-12: qty 1

## 2014-06-12 MED ORDER — SUCCINYLCHOLINE CHLORIDE 20 MG/ML IJ SOLN
INTRAMUSCULAR | Status: AC
  Filled 2014-06-12: qty 1

## 2014-06-12 MED ORDER — PHENOL 1.4 % MT LIQD: 1.0000 | OROMUCOSAL | Status: DC | PRN

## 2014-06-12 MED ORDER — CYCLOBENZAPRINE HCL 10 MG PO TABS
10.0000 mg | ORAL_TABLET | Freq: Three times a day (TID) | ORAL | Status: DC | PRN
  Administered 2014-06-12 – 2014-06-13 (×2): 10 mg via ORAL
  Filled 2014-06-12 (×4): qty 1

## 2014-06-12 MED ORDER — SODIUM CHLORIDE 0.9 % IJ SOLN
3.0000 mL | Freq: Two times a day (BID) | INTRAMUSCULAR | Status: DC
  Administered 2014-06-13 (×2): 3 mL via INTRAVENOUS

## 2014-06-12 MED ORDER — ARTIFICIAL TEARS OP OINT
TOPICAL_OINTMENT | OPHTHALMIC | Status: DC | PRN
  Administered 2014-06-12: 1 via OPHTHALMIC

## 2014-06-12 MED ORDER — ATENOLOL 50 MG PO TABS
50.0000 mg | ORAL_TABLET | Freq: Every day | ORAL | Status: DC
  Administered 2014-06-13 – 2014-06-19 (×7): 50 mg via ORAL
  Filled 2014-06-12: qty 2
  Filled 2014-06-12 (×2): qty 1
  Filled 2014-06-12: qty 2
  Filled 2014-06-12 (×3): qty 1

## 2014-06-12 MED ORDER — SODIUM CHLORIDE 0.9 % IJ SOLN
INTRAMUSCULAR | Status: AC
  Filled 2014-06-12: qty 10

## 2014-06-12 MED ORDER — GABAPENTIN ENACARBIL ER 300 MG PO TBCR: 300.0000 mg | EXTENDED_RELEASE_TABLET | Freq: Two times a day (BID) | ORAL | Status: DC

## 2014-06-12 MED ORDER — MIDAZOLAM HCL 5 MG/5ML IJ SOLN
INTRAMUSCULAR | Status: DC | PRN
  Administered 2014-06-12: 2 mg via INTRAVENOUS

## 2014-06-12 MED ORDER — GABAPENTIN 300 MG PO CAPS
300.0000 mg | ORAL_CAPSULE | Freq: Two times a day (BID) | ORAL | Status: DC
  Administered 2014-06-12 – 2014-06-19 (×14): 300 mg via ORAL
  Filled 2014-06-12 (×16): qty 1

## 2014-06-12 MED ORDER — PHENYLEPHRINE HCL 10 MG/ML IJ SOLN
10.0000 mg | INTRAVENOUS | Status: DC | PRN
  Administered 2014-06-12: 10 ug/min via INTRAVENOUS

## 2014-06-12 MED ORDER — BACITRACIN 50000 UNITS IM SOLR
INTRAMUSCULAR | Status: DC | PRN
  Administered 2014-06-12 (×2): 500 mL

## 2014-06-12 MED ORDER — LACTATED RINGERS IV SOLN
INTRAVENOUS | Status: DC | PRN
  Administered 2014-06-12 (×3): via INTRAVENOUS

## 2014-06-12 MED ORDER — ONDANSETRON HCL 4 MG/2ML IJ SOLN
INTRAMUSCULAR | Status: DC | PRN
  Administered 2014-06-12: 4 mg via INTRAVENOUS

## 2014-06-12 MED ORDER — NEOSTIGMINE METHYLSULFATE 10 MG/10ML IV SOLN
INTRAVENOUS | Status: DC | PRN
  Administered 2014-06-12: 4 mg via INTRAVENOUS

## 2014-06-12 MED ORDER — PROMETHAZINE HCL 25 MG/ML IJ SOLN
6.2500 mg | INTRAMUSCULAR | Status: DC | PRN
Start: 2014-06-12 — End: 2014-06-12

## 2014-06-12 MED ORDER — DOCUSATE SODIUM 100 MG PO CAPS
100.0000 mg | ORAL_CAPSULE | Freq: Two times a day (BID) | ORAL | Status: DC
  Administered 2014-06-12 – 2014-06-19 (×13): 100 mg via ORAL
  Filled 2014-06-12 (×13): qty 1

## 2014-06-12 MED ORDER — EPHEDRINE SULFATE 50 MG/ML IJ SOLN
INTRAMUSCULAR | Status: DC | PRN
  Administered 2014-06-12 (×2): 10 mg via INTRAVENOUS

## 2014-06-12 MED ORDER — ZOLPIDEM TARTRATE 5 MG PO TABS
5.0000 mg | ORAL_TABLET | Freq: Every evening | ORAL | Status: DC | PRN
  Administered 2014-06-13 – 2014-06-16 (×4): 5 mg via ORAL
  Filled 2014-06-12 (×4): qty 1

## 2014-06-12 MED ORDER — VECURONIUM BROMIDE 10 MG IV SOLR
INTRAVENOUS | Status: DC | PRN
  Administered 2014-06-12 (×3): 2 mg via INTRAVENOUS

## 2014-06-12 MED ORDER — ACETAMINOPHEN 650 MG RE SUPP: 650.0000 mg | RECTAL | Status: DC | PRN

## 2014-06-12 MED ORDER — MIDAZOLAM HCL 2 MG/2ML IJ SOLN
INTRAMUSCULAR | Status: AC
  Filled 2014-06-12: qty 2

## 2014-06-12 MED ORDER — NEOSTIGMINE METHYLSULFATE 10 MG/10ML IV SOLN
INTRAVENOUS | Status: AC
Start: 2014-06-12 — End: 2014-06-12
  Filled 2014-06-12: qty 1

## 2014-06-12 MED ORDER — KCL IN DEXTROSE-NACL 20-5-0.45 MEQ/L-%-% IV SOLN: 80.0000 mL/h | INTRAVENOUS | Status: DC

## 2014-06-12 MED ORDER — TAMSULOSIN HCL 0.4 MG PO CAPS
0.4000 mg | ORAL_CAPSULE | Freq: Every day | ORAL | Status: DC
  Administered 2014-06-13 – 2014-06-19 (×7): 0.4 mg via ORAL
  Filled 2014-06-12 (×7): qty 1

## 2014-06-12 MED ORDER — HYDROMORPHONE HCL 1 MG/ML IJ SOLN
INTRAMUSCULAR | Status: AC
  Filled 2014-06-12: qty 1

## 2014-06-12 MED ORDER — EPHEDRINE SULFATE 50 MG/ML IJ SOLN
INTRAMUSCULAR | Status: AC
Start: 2014-06-12 — End: 2014-06-12
  Filled 2014-06-12: qty 1

## 2014-06-12 MED ORDER — BUPIVACAINE LIPOSOME 1.3 % IJ SUSP
20.0000 mL | INTRAMUSCULAR | Status: AC
  Filled 2014-06-12: qty 20

## 2014-06-12 MED ORDER — THROMBIN 20000 UNITS EX SOLR
CUTANEOUS | Status: DC | PRN
  Administered 2014-06-12: 20 mL via TOPICAL

## 2014-06-12 MED ORDER — ROCURONIUM BROMIDE 100 MG/10ML IV SOLN
INTRAVENOUS | Status: DC | PRN
  Administered 2014-06-12: 50 mg via INTRAVENOUS

## 2014-06-12 MED ORDER — SUFENTANIL CITRATE 50 MCG/ML IV SOLN
INTRAVENOUS | Status: AC
  Filled 2014-06-12: qty 1

## 2014-06-12 MED ORDER — ONDANSETRON HCL 4 MG/2ML IJ SOLN
INTRAMUSCULAR | Status: AC
  Filled 2014-06-12: qty 2

## 2014-06-12 MED ORDER — RISPERIDONE 3 MG PO TABS
3.0000 mg | ORAL_TABLET | Freq: Every day | ORAL | Status: DC
  Administered 2014-06-12 – 2014-06-18 (×7): 3 mg via ORAL
  Filled 2014-06-12 (×2): qty 1
  Filled 2014-06-12: qty 6
  Filled 2014-06-12 (×3): qty 1
  Filled 2014-06-12: qty 6
  Filled 2014-06-12: qty 1

## 2014-06-12 MED ORDER — HYDROCODONE-ACETAMINOPHEN 5-325 MG PO TABS
1.0000 | ORAL_TABLET | ORAL | Status: DC | PRN
  Administered 2014-06-12 – 2014-06-13 (×4): 2 via ORAL
  Filled 2014-06-12 (×4): qty 2

## 2014-06-12 MED ORDER — PANTOPRAZOLE SODIUM 40 MG IV SOLR: 40.0000 mg | Freq: Every day | INTRAVENOUS | Status: DC

## 2014-06-12 MED ORDER — HYDROMORPHONE HCL 1 MG/ML IJ SOLN: 0.2500 mg | INTRAMUSCULAR | Status: DC | PRN

## 2014-06-12 MED ORDER — GLYCOPYRROLATE 0.2 MG/ML IJ SOLN
INTRAMUSCULAR | Status: DC | PRN
  Administered 2014-06-12: 0.6 mg via INTRAVENOUS

## 2014-06-12 MED ORDER — ACETAMINOPHEN 325 MG PO TABS
650.0000 mg | ORAL_TABLET | ORAL | Status: DC | PRN
  Administered 2014-06-14 – 2014-06-15 (×5): 650 mg via ORAL
  Filled 2014-06-12 (×5): qty 2

## 2014-06-12 MED ORDER — SUFENTANIL CITRATE 50 MCG/ML IV SOLN
INTRAVENOUS | Status: DC | PRN
  Administered 2014-06-12: 30 ug via INTRAVENOUS
  Administered 2014-06-12 (×2): 5 ug via INTRAVENOUS

## 2014-06-12 MED ORDER — SODIUM CHLORIDE 0.9 % IJ SOLN: 3.0000 mL | INTRAMUSCULAR | Status: DC | PRN

## 2014-06-12 MED ORDER — CEFAZOLIN SODIUM-DEXTROSE 2-3 GM-% IV SOLR
2.0000 g | Freq: Three times a day (TID) | INTRAVENOUS | Status: AC
  Administered 2014-06-12 – 2014-06-13 (×2): 2 g via INTRAVENOUS
  Filled 2014-06-12 (×2): qty 50

## 2014-06-12 MED ORDER — PROPOFOL 10 MG/ML IV BOLUS
INTRAVENOUS | Status: DC | PRN
  Administered 2014-06-12: 150 mg via INTRAVENOUS

## 2014-06-12 MED ORDER — MIDAZOLAM HCL 2 MG/2ML IJ SOLN: 0.5000 mg | Freq: Once | INTRAMUSCULAR | Status: DC | PRN

## 2014-06-12 MED ORDER — PANTOPRAZOLE SODIUM 40 MG PO TBEC
40.0000 mg | DELAYED_RELEASE_TABLET | Freq: Every day | ORAL | Status: DC
Start: 2014-06-12 — End: 2014-06-19
  Administered 2014-06-12 – 2014-06-18 (×7): 40 mg via ORAL
  Filled 2014-06-12 (×7): qty 1

## 2014-06-12 MED ORDER — DEXAMETHASONE 4 MG PO TABS
4.0000 mg | ORAL_TABLET | Freq: Four times a day (QID) | ORAL | Status: AC
Start: 2014-06-12 — End: 2014-06-12
  Administered 2014-06-12: 4 mg via ORAL
  Filled 2014-06-12: qty 1

## 2014-06-12 SURGICAL SUPPLY — 69 items
APL SKNCLS STERI-STRIP NONHPOA (GAUZE/BANDAGES/DRESSINGS) ×1
BAG DECANTER FOR FLEXI CONT (MISCELLANEOUS) ×3 IMPLANT
BENZOIN TINCTURE PRP APPL 2/3 (GAUZE/BANDAGES/DRESSINGS) ×4 IMPLANT
BLADE CLIPPER SURG (BLADE) IMPLANT
BONE EQUIVA 5CC (Bone Implant) ×2 IMPLANT
BRUSH SCRUB EZ PLAIN DRY (MISCELLANEOUS) ×3 IMPLANT
BUR CUTTER 7.0 ROUND (BURR) ×3 IMPLANT
BUR MATCHSTICK NEURO 3.0 LAGG (BURR) ×3 IMPLANT
CANISTER SUCT 3000ML PPV (MISCELLANEOUS) ×3 IMPLANT
CLOSURE WOUND 1/2 X4 (GAUZE/BANDAGES/DRESSINGS) ×2
CONT SPEC 4OZ CLIKSEAL STRL BL (MISCELLANEOUS) ×6 IMPLANT
COVER BACK TABLE 60X90IN (DRAPES) ×3 IMPLANT
DRAPE C-ARM 42X72 X-RAY (DRAPES) ×6 IMPLANT
DRAPE LAPAROTOMY 100X72X124 (DRAPES) ×3 IMPLANT
DRAPE SURG 17X23 STRL (DRAPES) ×6 IMPLANT
DRSG OPSITE POSTOP 4X6 (GAUZE/BANDAGES/DRESSINGS) ×3 IMPLANT
DRSG TELFA 3X8 NADH (GAUZE/BANDAGES/DRESSINGS) ×3 IMPLANT
DURAPREP 26ML APPLICATOR (WOUND CARE) ×3 IMPLANT
ELECT REM PT RETURN 9FT ADLT (ELECTROSURGICAL) ×3
ELECTRODE REM PT RTRN 9FT ADLT (ELECTROSURGICAL) ×1 IMPLANT
EVACUATOR 1/8 PVC DRAIN (DRAIN) ×3 IMPLANT
GAUZE SPONGE 4X4 12PLY STRL (GAUZE/BANDAGES/DRESSINGS) ×3 IMPLANT
GAUZE SPONGE 4X4 16PLY XRAY LF (GAUZE/BANDAGES/DRESSINGS) IMPLANT
GLOVE BIOGEL PI IND STRL 8 (GLOVE) IMPLANT
GLOVE BIOGEL PI INDICATOR 8 (GLOVE) ×8
GLOVE ECLIPSE 8.0 STRL XLNG CF (GLOVE) ×6 IMPLANT
GLOVE EXAM NITRILE LRG STRL (GLOVE) IMPLANT
GLOVE EXAM NITRILE MD LF STRL (GLOVE) IMPLANT
GLOVE EXAM NITRILE XS STR PU (GLOVE) IMPLANT
GLOVE SS N UNI LF 7.5 STRL (GLOVE) ×6 IMPLANT
GOWN STRL REUS W/ TWL LRG LVL3 (GOWN DISPOSABLE) IMPLANT
GOWN STRL REUS W/ TWL XL LVL3 (GOWN DISPOSABLE) ×2 IMPLANT
GOWN STRL REUS W/TWL 2XL LVL3 (GOWN DISPOSABLE) ×4 IMPLANT
GOWN STRL REUS W/TWL LRG LVL3 (GOWN DISPOSABLE) ×3
GOWN STRL REUS W/TWL XL LVL3 (GOWN DISPOSABLE) ×6
HANDLE PEDIGUARD CANNULATED (INSTRUMENTS) ×2 IMPLANT
IMPLANT ARDIS PEEK 10X9X26 (Orthopedic Implant) ×4 IMPLANT
K-WIRE NITHNOL TROCAR TIP (WIRE) ×8 IMPLANT
KIT BASIN OR (CUSTOM PROCEDURE TRAY) ×3 IMPLANT
KIT ROOM TURNOVER OR (KITS) ×3 IMPLANT
LIQUID BAND (GAUZE/BANDAGES/DRESSINGS) IMPLANT
MILL MEDIUM DISP (BLADE) ×2 IMPLANT
NEEDLE 1 PEDIGUARD CANNULATED (NEEDLE) ×4 IMPLANT
NEEDLE BEVELED TARGETTING (NEEDLE) ×2 IMPLANT
NEEDLE HYPO 22GX1.5 SAFETY (NEEDLE) ×3 IMPLANT
NS IRRIG 1000ML POUR BTL (IV SOLUTION) ×3 IMPLANT
PACK LAMINECTOMY NEURO (CUSTOM PROCEDURE TRAY) ×3 IMPLANT
PAD ARMBOARD 7.5X6 YLW CONV (MISCELLANEOUS) ×9 IMPLANT
PAD DRESSING TELFA 3X8 NADH (GAUZE/BANDAGES/DRESSINGS) ×1 IMPLANT
PATTIES SURGICAL .75X.75 (GAUZE/BANDAGES/DRESSINGS) IMPLANT
ROD PATHFINDER 40MM (Rod) ×4 IMPLANT
SCREW MIN INVASIVE 6.5X35 (Screw) ×4 IMPLANT
SCREW POLYAXIA MIS 6.5X40MM (Screw) ×4 IMPLANT
SPONGE LAP 4X18 X RAY DECT (DISPOSABLE) IMPLANT
SPONGE SURGIFOAM ABS GEL 100 (HEMOSTASIS) ×3 IMPLANT
STRIP CLOSURE SKIN 1/2X4 (GAUZE/BANDAGES/DRESSINGS) ×4 IMPLANT
SUT PROLENE 0 CT 1 30 (SUTURE) IMPLANT
SUT VIC AB 0 CT1 18XCR BRD8 (SUTURE) ×1 IMPLANT
SUT VIC AB 0 CT1 8-18 (SUTURE) ×6
SUT VIC AB 2-0 OS6 18 (SUTURE) ×9 IMPLANT
SUT VIC AB 3-0 CP2 18 (SUTURE) ×3 IMPLANT
SYR 20ML ECCENTRIC (SYRINGE) ×3 IMPLANT
TAPE STRIPS DRAPE STRL (GAUZE/BANDAGES/DRESSINGS) ×2 IMPLANT
TOP CLSR SEQUOIA (Orthopedic Implant) ×8 IMPLANT
TOWEL OR 17X24 6PK STRL BLUE (TOWEL DISPOSABLE) ×3 IMPLANT
TOWEL OR 17X26 10 PK STRL BLUE (TOWEL DISPOSABLE) ×3 IMPLANT
TRAP SPECIMEN MUCOUS 40CC (MISCELLANEOUS) ×3 IMPLANT
TRAY FOLEY W/METER SILVER 16FR (SET/KITS/TRAYS/PACK) ×2 IMPLANT
WATER STERILE IRR 1000ML POUR (IV SOLUTION) ×3 IMPLANT

## 2014-06-12 NOTE — Anesthesia Procedure Notes (Signed)
Procedure Name: Intubation Date/Time: 06/12/2014 7:47 AM Performed by: Willeen Cass P Pre-anesthesia Checklist: Patient identified, Timeout performed, Emergency Drugs available, Suction available and Patient being monitored Patient Re-evaluated:Patient Re-evaluated prior to inductionOxygen Delivery Method: Circle system utilized Preoxygenation: Pre-oxygenation with 100% oxygen Intubation Type: IV induction Ventilation: Mask ventilation without difficulty Laryngoscope Size: Mac and 3 Grade View: Grade I Tube type: Oral Tube size: 7.5 mm Number of attempts: 1 Airway Equipment and Method: Stylet and Bite block Placement Confirmation: ETT inserted through vocal cords under direct vision,  breath sounds checked- equal and bilateral and positive ETCO2 Secured at: 21 cm Tube secured with: Tape Dental Injury: Teeth and Oropharynx as per pre-operative assessment

## 2014-06-12 NOTE — Transfer of Care (Signed)
Immediate Anesthesia Transfer of Care Note  Patient: Andre Cervantes  Procedure(s) Performed: Procedure(s) with comments: POSTERIOR LUMBAR FUSION 1 LEVEL (N/A) - L5S1 posterior lumbar interbody fusion with interbody prosthesis posterior lateral arthrodesis and posterior nonsegmental instrumentation  Patient Location: PACU  Anesthesia Type:General  Level of Consciousness: awake, patient cooperative and lethargic  Airway & Oxygen Therapy: Patient Spontanous Breathing and Patient connected to nasal cannula oxygen  Post-op Assessment: Report given to RN, Post -op Vital signs reviewed and stable and Patient moving all extremities X 4  Post vital signs: Reviewed and stable  Last Vitals:  BP 133/85 SpO2 96% on 4L [place]  HR 85 RR 18  Complications: No apparent anesthesia complications

## 2014-06-12 NOTE — Op Note (Signed)
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 Localizing x-ray was taken prior to incision to identify the appropriate level. Midline incision was made above the spinous processes of L5 and S1. The incision was carried down to the dorsal lumbar fascia and the we then dissected the plane between the fascia subcutaneous tissue. We then did a subperiosteal dissection along the spinous processes of L5 and S1 exposed the spinous processes lamina facet joint. Self-retaining tract was placed for exposure and excision approach the appropriate level. Spinous processes were then removed. Gill procedure was then performed by removing the free-floating lamina and inferior facet of L5. The superior facet of S1 was then removed as well. Thorough decompression was carried out of the L5 and S1 nerve roots bilaterally with removal of the reactive tissue and residual bone to decompress the L5 nerve roots as they came about the pedicle. We then used microdissection technique to identify the L5-S1 disc. We Coagulated on the annulus bilaterally and thoroughly cleaned out with pituitary rongeurs and curettes. At this time we prepared the disc for interbody fusion by distracting up to a 10 mm size. We prepared to 10 mm x 9 mm x 26 mm cages and filled with a mixture of autologous bone and morselized allograft. We placed the first cage and on the opposite side placed a mixture of  autologous bone and morselized allograft deep within the interspace to help with interbody fusion. We then placed a second cage. We irrigated this area copiously controlled any bleeding with upper coagulation and Gelfoam. We closed the dorsal lumbar fascia and then placed percutaneous pedicle screws in standard fashion. We passed Jamshidi needles through the pedicles at L5-S1 bilaterally then placed wires to remove the needles. We used ultrasonic guided needles to help with our passage. We then tapped with a 6.0 mm tap and then placed 6.5 x 40 mm screws at L5 and 6.5 by 64mm screws at S1. The screws were followed in excellent position under AP lateral fluoroscopy. We then passed appropriate length rods down the towers and secured them to the top of the screws with top loading nuts. We then did tightening and final tightening with torque and counter torque and remove the towers. Final fluoroscopy in AP lateral direction looked excellent. We irrigated copiously controlled any bleeding with upper coagulation. We closed the dorsal lumbar fascia over the screw incisions and then irrigated copiously once more. We left a drain in the suprafascial space and then closed the subcutaneous and subcuticular tissues with inverted Vicryl. A running locking Prolene was placed on the skin. Shortness was then applied and the patient was extubated and taken to recovery room in stable condition.

## 2014-06-12 NOTE — Anesthesia Postprocedure Evaluation (Signed)
  Anesthesia Post-op Note  Patient: Andre Cervantes  Procedure(s) Performed: Procedure(s) with comments: POSTERIOR LUMBAR FUSION 1 LEVEL (N/A) - L5S1 posterior lumbar interbody fusion with interbody prosthesis posterior lateral arthrodesis and posterior nonsegmental instrumentation  Patient Location: PACU  Anesthesia Type:General  Level of Consciousness: awake, alert , oriented and patient cooperative  Airway and Oxygen Therapy: Patient Spontanous Breathing and Patient connected to nasal cannula oxygen  Post-op Pain: mild  Post-op Assessment: Post-op Vital signs reviewed, Patient's Cardiovascular Status Stable, Respiratory Function Stable, Patent Airway, No signs of Nausea or vomiting and Pain level controlled  Post-op Vital Signs: Reviewed and stable  Last Vitals:  Filed Vitals:   06/12/14 1233  BP:   Pulse: 92  Temp: 36.1 C  Resp: 20    Complications: No apparent anesthesia complications

## 2014-06-12 NOTE — H&P (Signed)
Andre Cervantes is an 29 y.o. male.   Chief Complaint: Back pain into the left leg HPI: The patient is a 29 year old gentleman who is evaluated in the office for back pain with radiation the left leg of many months duration. There is no inciting event. Approximately 2 years ago he was apparently seen by an orthopedist and was told he needed surgery at that time. He had knee problems total needed surgery on the knee surgery have any surgery first thing a DVT and was on Coumadin. Orthopedic spine Dr. then do not feel couple proceed with surgery and was never carried out. At this time the patient states that his right leg is asymptomatic. An MRI scan was done which showed pars defects at L5 with listhesis at L5-S1 and marked encroachment and the foramen on the left. After discussing the options the patient requested surgery and now comes for a decompression L5-S1 with discectomy interbody fusion and pedicle screw fixation. I've had a long discussion with him regarding the risks and benefits of surgical intervention. The risks discussed include but are not limited to bleeding infection weakness some as paralysis spinal fluid leak trouble with instrumentation nonunion coma and death. We have discussed alternative methods of therapy offered risks and benefits of nonintervention. He's had the opportunity to ask numerous questions and appears to understand. With this information in hand he has requested we proceed with surgery.  Past Medical History  Diagnosis Date  . ADD (attention deficit disorder)   . GERD (gastroesophageal reflux disease)   . Hypertension   . DVT (deep venous thrombosis) 2013  . PE (pulmonary embolism) 13    hx  . Arthritis     Past Surgical History  Procedure Laterality Date  . Knee surgery Bilateral 2013     knee  . Rt hand little finger pinning  childhood  . Knee arthroscopy with lateral release Right 01/05/2013    Procedure: KNEE ARTHROSCOPY WITH DEBRIDEMENT, LATERAL  RELEASE, CARTICEL PROCEDURE, OPEN ALLOGRAFT;  Surgeon: Sydnee Cabal, MD;  Location: [facility];  Service: Orthopedics;  Laterality: Right;  . Medial patellofemoral ligament repair Right 01/05/2013    Procedure: MEDIAL PATELLA FEMORAL LIGAMENT RECONSTRUCTION;  Surgeon: Sydnee Cabal, MD;  Location: [place];  Service: Orthopedics;  Laterality: Right;  . Fracture surgery      rt hand    History reviewed. No pertinent family history. Social History:  reports that he has never smoked. His smokeless tobacco use includes Chew. He reports that he drinks alcohol. He reports that he uses illicit drugs (Other-see comments).  Allergies:  Allergies  Allergen Reactions  . Fentanyl Other (See Comments)    Patient developed severe chest wall rigidity following IV dose of Fentanyl for post op pain management. Reaction occurred on first and second administration of Fentanyl intraop producing significant drop in SaO2 and increased airway pressures.  . Morphine Swelling    Medications Prior to Admission  Medication Sig Dispense Refill  . acetaminophen (TYLENOL) 500 MG tablet Take 1,000 mg by mouth every 8 (eight) hours as needed for moderate pain.    Marland Kitchen amitriptyline (ELAVIL) 25 MG tablet Take 50 mg by mouth at bedtime.     Marland Kitchen atenolol (TENORMIN) 50 MG tablet Take 50 mg by mouth daily.    . fenofibrate micronized (LOFIBRA) 134 MG capsule Take 134 mg by mouth at bedtime.     . Gabapentin Enacarbil ER 300 MG TBCR Take 1 capsule by mouth 2 (two) times daily  as needed (Takes 1 daily, can take another if needed).    . Gabapentin Enacarbil ER 300 MG TBCR Take 300 mg by mouth 2 (two) times daily.    Marland Kitchen loratadine (CLARITIN) 10 MG tablet Take 10 mg by mouth daily.    Marland Kitchen lovastatin (MEVACOR) 40 MG tablet Take 40 mg by mouth at bedtime.    . montelukast (SINGULAIR) 10 MG tablet Take 10 mg by mouth at bedtime.    Marland Kitchen omeprazole (PRILOSEC) 20 MG capsule Take 20 mg by mouth daily.     .  risperiDONE (RISPERDAL) 1 MG tablet Take 3 mg by mouth at bedtime.     . tamsulosin (FLOMAX) 0.4 MG CAPS capsule Take 0.4 mg by mouth daily.     Marland Kitchen warfarin (COUMADIN) 4 MG tablet Take 4 mg by mouth every other day. Currently alternates with 6 mg    . warfarin (COUMADIN) 6 MG tablet Take 6 mg by mouth every other day. Currently alternates with 4 mg    . enoxaparin (LOVENOX) 30 MG/0.3ML injection Inject 0.3 mLs (30 mg total) into the skin every 12 (twelve) hours. (Patient not taking: Reported on 05/30/2014) 7 Syringe 0  . methocarbamol (ROBAXIN) 500 MG tablet Take 1 tablet (500 mg total) by mouth every 6 (six) hours as needed for muscle spasms. (Patient not taking: Reported on 05/30/2014) 50 tablet 2  . oxyCODONE-acetaminophen (PERCOCET/ROXICET) 5-325 MG per tablet Take 1-2 tablets by mouth every 4 (four) hours as needed for moderate pain. (Patient not taking: Reported on 05/30/2014) 60 tablet 0    No results found for this or any previous visit (from the past 48 hour(s)). No results found.  Positive for high blood pressure high cholesterol shortness of breath trouble with urination problems with memory difficulty with speech and inability to concentrate along with depression  Blood pressure 118/77, pulse 68, temperature 97.8 F (36.6 C), temperature source Oral, resp. rate 20, height 5\' 9"  (1.753 m), weight 89.812 kg (198 lb), SpO2 99 %.  The patient is awake alert and oriented. Her sensation is intact. Reflexes are normal and strengthens is intact. Assessment/Plan Impression is that of spondylolysis with spondylolisthesis L5-S1. The plan is for an L5-S1 fusion with pedicle screw fixation.  [name], MD 06/12/2014, 7:24 AM

## 2014-06-12 NOTE — Progress Notes (Signed)
Orthopedic Tech Progress Note Patient Details:  ALDRIDGE KRZYZANOWSKI 1985-08-13 [id number]  Patient ID: [name], male   DOB: [birth year], 29 y.o.   MRN: [id number] RN stated that pt already has aspen lumbar brace  Sharlet Salina, Dreyton Roessner 06/12/2014, 1:02 PM

## 2014-06-12 NOTE — Anesthesia Preprocedure Evaluation (Addendum)
Anesthesia Evaluation  Patient identified by MRN, date of birth, ID band Patient awake    Reviewed: Allergy & Precautions, NPO status , Patient's Chart, lab work & pertinent test results, reviewed documented beta blocker date and time   History of Anesthesia Complications Negative for: history of anesthetic complications  Airway Mallampati: II  TM Distance: >3 FB Neck ROM: Full    Dental  (+) Poor Dentition, Chipped, Dental Advisory Given   Pulmonary neg pulmonary ROS, PE breath sounds clear to auscultation        Cardiovascular hypertension, Pt. on medications and Pt. on home beta blockers DVT Rhythm:Regular Rate:Normal  '13 ECHO: EF 65-70%, valves OK   Neuro/Psych Anxiety Depression negative neurological ROS     GI/Hepatic Neg liver ROS, GERD-  Medicated and Controlled,Elevated LFTs   Endo/Other  negative endocrine ROS  Renal/GU negative Renal ROS     Musculoskeletal  (+) Arthritis -,   Abdominal   Peds  (+) ATTENTION DEFICIT DISORDER WITHOUT HYPERACTIVITY Hematology  (+) Blood dyscrasia (off of Coumadin for 5-6 days), ,   Anesthesia Other Findings   Reproductive/Obstetrics                            Anesthesia Physical Anesthesia Plan  ASA: III  Anesthesia Plan: General   Post-op Pain Management:    Induction: Intravenous  Airway Management Planned: Oral ETT  Additional Equipment:   Intra-op Plan:   Post-operative Plan: Extubation in OR  Informed Consent: I have reviewed the patients History and Physical, chart, labs and discussed the procedure including the risks, benefits and alternatives for the proposed anesthesia with the patient or authorized representative who has indicated his/her understanding and acceptance.   Dental advisory given  Plan Discussed with: CRNA and Surgeon  Anesthesia Plan Comments: (Plan routine monitors, GETA)        Anesthesia Quick  Evaluation

## 2014-06-13 MED ORDER — OXYCODONE HCL 5 MG PO TABS
10.0000 mg | ORAL_TABLET | ORAL | Status: DC | PRN
  Administered 2014-06-13 – 2014-06-17 (×8): 10 mg via ORAL
  Filled 2014-06-13 (×8): qty 2

## 2014-06-13 NOTE — Progress Notes (Signed)
Patient ID: Andre Cervantes, male   DOB: 1985/05/14, 29 y.o.   MRN: [id number] Afeb, vss No new neuro issues Says his leg pain is much better. Has already ambulated some. Will increase activity today, and probable d/c tomorrow if ready.

## 2014-06-14 ENCOUNTER — Inpatient Hospital Stay (HOSPITAL_COMMUNITY): Payer: Medicare Other

## 2014-06-14 DIAGNOSIS — J189 Pneumonia, unspecified organism: Secondary | ICD-10-CM

## 2014-06-14 DIAGNOSIS — J9601 Acute respiratory failure with hypoxia: Secondary | ICD-10-CM

## 2014-06-14 DIAGNOSIS — R0902 Hypoxemia: Secondary | ICD-10-CM

## 2014-06-14 LAB — BASIC METABOLIC PANEL
ANION GAP: 10 (ref 5–15)
BUN: 7 mg/dL (ref 6–20)
CALCIUM: 9.5 mg/dL (ref 8.9–10.3)
CO2: 21 mmol/L — ABNORMAL LOW (ref 22–32)
Chloride: 108 mmol/L (ref 101–111)
Creatinine, Ser: 1.1 mg/dL (ref 0.61–1.24)
GFR calc Af Amer: >60 mL/min (ref >60)
Glucose, Bld: 134 mg/dL — ABNORMAL HIGH (ref 65–99)
POTASSIUM: 4.1 mmol/L (ref 3.5–5.1)
SODIUM: 139 mmol/L (ref 135–145)

## 2014-06-14 LAB — GLUCOSE, CAPILLARY: Glucose-Capillary: 193 mg/dL — ABNORMAL HIGH (ref 65–99)

## 2014-06-14 LAB — HEPARIN LEVEL (UNFRACTIONATED): Heparin Unfractionated: 0.15 IU/mL — ABNORMAL LOW (ref 0.30–0.70)

## 2014-06-14 LAB — LACTIC ACID, PLASMA: Lactic Acid, Venous: 1.6 mmol/L (ref 0.5–2.0)

## 2014-06-14 LAB — CBC
HCT: 38.6 % — ABNORMAL LOW (ref 39.0–52.0)
Hemoglobin: 12.7 g/dL — ABNORMAL LOW (ref 13.0–17.0)
MCH: 30.5 pg (ref 26.0–34.0)
MCHC: 32.9 g/dL (ref 30.0–36.0)
MCV: 92.6 fL (ref 78.0–100.0)
Platelets: 375 10*3/uL (ref 150–400)
RBC: 4.17 MIL/uL — ABNORMAL LOW (ref 4.22–5.81)
RDW: 12.7 % (ref 11.5–15.5)
WBC: 20.7 10*3/uL — ABNORMAL HIGH (ref 4.0–10.5)

## 2014-06-14 LAB — PROCALCITONIN: Procalcitonin: 0.41 ng/mL

## 2014-06-14 MED ORDER — VANCOMYCIN HCL IN DEXTROSE 1-5 GM/200ML-% IV SOLN
1000.0000 mg | Freq: Three times a day (TID) | INTRAVENOUS | Status: DC
  Administered 2014-06-14 – 2014-06-18 (×11): 1000 mg via INTRAVENOUS
  Filled 2014-06-14 (×14): qty 200

## 2014-06-14 MED ORDER — PIPERACILLIN-TAZOBACTAM 3.375 G IVPB
3.3750 g | Freq: Three times a day (TID) | INTRAVENOUS | Status: DC
  Administered 2014-06-14 – 2014-06-18 (×11): 3.375 g via INTRAVENOUS
  Filled 2014-06-14 (×14): qty 50

## 2014-06-14 MED ORDER — LACTATED RINGERS IV BOLUS (SEPSIS)
250.0000 mL | Freq: Once | INTRAVENOUS | Status: AC
  Administered 2014-06-14: 250 mL via INTRAVENOUS

## 2014-06-14 MED ORDER — VANCOMYCIN HCL 10 G IV SOLR
1500.0000 mg | Freq: Once | INTRAVENOUS | Status: AC
  Administered 2014-06-14: 1500 mg via INTRAVENOUS
  Filled 2014-06-14: qty 1500

## 2014-06-14 MED ORDER — CETYLPYRIDINIUM CHLORIDE 0.05 % MT LIQD
7.0000 mL | Freq: Two times a day (BID) | OROMUCOSAL | Status: DC
  Administered 2014-06-14 – 2014-06-19 (×10): 7 mL via OROMUCOSAL

## 2014-06-14 MED ORDER — HEPARIN (PORCINE) IN NACL 100-0.45 UNIT/ML-% IJ SOLN
2100.0000 [IU]/h | INTRAMUSCULAR | Status: DC
  Administered 2014-06-15 (×2): 1900 [IU]/h via INTRAVENOUS
  Administered 2014-06-16 (×2): 2000 [IU]/h via INTRAVENOUS
  Administered 2014-06-17: 2200 [IU]/h via INTRAVENOUS
  Administered 2014-06-19: 2150 [IU]/h via INTRAVENOUS
  Filled 2014-06-14 (×13): qty 250

## 2014-06-14 MED ORDER — SODIUM CHLORIDE 0.9 % IV SOLN
INTRAVENOUS | Status: DC
  Administered 2014-06-17: 04:00:00 via INTRAVENOUS

## 2014-06-14 MED ORDER — WARFARIN SODIUM 6 MG PO TABS
6.0000 mg | ORAL_TABLET | Freq: Once | ORAL | Status: AC
  Administered 2014-06-14: 6 mg via ORAL
  Filled 2014-06-14: qty 1

## 2014-06-14 MED ORDER — HEPARIN (PORCINE) IN NACL 100-0.45 UNIT/ML-% IJ SOLN
1400.0000 [IU]/h | INTRAMUSCULAR | Status: DC
  Administered 2014-06-14: 1400 [IU]/h via INTRAVENOUS
  Filled 2014-06-14 (×2): qty 250

## 2014-06-14 MED ORDER — PIPERACILLIN-TAZOBACTAM 3.375 G IVPB 30 MIN
3.3750 g | Freq: Once | INTRAVENOUS | Status: AC
  Administered 2014-06-14: 3.375 g via INTRAVENOUS
  Filled 2014-06-14: qty 50

## 2014-06-14 MED ORDER — WARFARIN - PHARMACIST DOSING INPATIENT
Freq: Every day | Status: DC
  Administered 2014-06-17: 18:00:00

## 2014-06-14 NOTE — Progress Notes (Addendum)
ANTICOAGULATION CONSULT NOTE - Initial Consult  Pharmacy Consult for warfarin Indication: Hx DVT and PE  Allergies  Allergen Reactions  . Fentanyl Other (See Comments)    Patient developed severe chest wall rigidity following IV dose of Fentanyl for post op pain management. Reaction occurred on first and second administration of Fentanyl intraop producing significant drop in SaO2 and increased airway pressures.  . Morphine Swelling    Patient Measurements: Height: 5\' 9"  (175.3 cm) Weight: 198 lb (89.812 kg) IBW/kg (Calculated) : 70.7 Vital Signs: Temp: 100.3 F (37.9 C) (05/19 0939) Temp Source: Oral (05/19 0939) BP: 125/81 mmHg (05/19 0939) Pulse Rate: 131 (05/19 0939)  Labs:  Recent Labs  06/12/14 0654  LABPROT 14.2  INR 1.08    Estimated Creatinine Clearance: 93.8 mL/min (by C-G formula based on Cr of 1.31).   Medical History: Past Medical History  Diagnosis Date  . ADD (attention deficit disorder)   . GERD (gastroesophageal reflux disease)   . Hypertension   . DVT (deep venous thrombosis) 2013  . PE (pulmonary embolism) 13    hx  . Arthritis     Medications:  Prescriptions prior to admission  Medication Sig Dispense Refill Last Dose  . acetaminophen (TYLENOL) 500 MG tablet Take 1,000 mg by mouth every 8 (eight) hours as needed for moderate pain.   Past Week at Unknown time  . amitriptyline (ELAVIL) 25 MG tablet Take 50 mg by mouth at bedtime.    06/11/2014 at Unknown time  . atenolol (TENORMIN) 50 MG tablet Take 50 mg by mouth daily.   06/12/2014 at 0330  . fenofibrate micronized (LOFIBRA) 134 MG capsule Take 134 mg by mouth at bedtime.    06/11/2014 at Unknown time  . Gabapentin Enacarbil ER 300 MG TBCR Take 1 capsule by mouth 2 (two) times daily as needed (Takes 1 daily, can take another if needed).   Past Week at Unknown time  . Gabapentin Enacarbil ER 300 MG TBCR Take 300 mg by mouth 2 (two) times daily.   Past Week at Unknown time  . loratadine (CLARITIN)  10 MG tablet Take 10 mg by mouth daily.   06/12/2014 at 0330  . lovastatin (MEVACOR) 40 MG tablet Take 40 mg by mouth at bedtime.   06/11/2014 at Unknown time  . montelukast (SINGULAIR) 10 MG tablet Take 10 mg by mouth at bedtime.   06/11/2014 at Unknown time  . omeprazole (PRILOSEC) 20 MG capsule Take 20 mg by mouth daily.    06/12/2014 at 0330  . risperiDONE (RISPERDAL) 1 MG tablet Take 3 mg by mouth at bedtime.    06/11/2014 at Unknown time  . tamsulosin (FLOMAX) 0.4 MG CAPS capsule Take 0.4 mg by mouth daily.    06/12/2014 at 0330  . warfarin (COUMADIN) 4 MG tablet Take 4 mg by mouth every other day. Currently alternates with 6 mg   06/06/2014  . warfarin (COUMADIN) 6 MG tablet Take 6 mg by mouth every other day. Currently alternates with 4 mg   06/05/2014  . enoxaparin (LOVENOX) 30 MG/0.3ML injection Inject 0.3 mLs (30 mg total) into the skin every 12 (twelve) hours. (Patient not taking: Reported on 05/30/2014) 7 Syringe 0 Not Taking at Unknown time  . methocarbamol (ROBAXIN) 500 MG tablet Take 1 tablet (500 mg total) by mouth every 6 (six) hours as needed for muscle spasms. (Patient not taking: Reported on 05/30/2014) 50 tablet 2 Not Taking at Unknown time  . oxyCODONE-acetaminophen (PERCOCET/ROXICET) 5-325 MG per tablet Take  1-2 tablets by mouth every 4 (four) hours as needed for moderate pain. (Patient not taking: Reported on 05/30/2014) 60 tablet 0 Not Taking at Unknown time    Assessment: 29 year old male on Coumadin prior to admission for hx of DVT and PE (10/26/11 after R-knee surgery earlier that same month) which has been on hold for back surgery. Patient is now POD#2 from back surgery with new fever (101.2), tachycardic, RR 18-22, and possible infiltrates on CXR. INR 1.08 on 5/17. Last dose of Coumadin on 06/06/14. Discussed with Dr. Hal Neer and he wants to resume Coumadin today with 6mg  dose now and follow per pharmacy dosing while inpatient. CCM to see for r/o clot versus infection.   *Note per  pharmacy notes - reason for life-long therapy was due to Lupus anticoagulant positive on hypercoagulable work-up.   Goal of Therapy:  INR 2-3 Monitor platelets by anticoagulation protocol: Yes   Plan:  Warfarin 6mg  po x1 today per Dr. Hal Neer.  Daily PT/INR.  Likely resume home dosing of 4mg  and 6mg  alternating pending decisions from CCM.   Sloan Leiter, PharmD, BCPS Clinical Pharmacist [phone] 06/14/2014,10:13 AM  Addendum:  CCM adding IV heparin for r/o PE. Due to POD#2 from back surgery, will NOT bolus and target lower range of 0.3 to 0.5 per discussions with CCM and Dr. Hal Neer. CBC is pending; CBC from 5/9 was normal.   Heparin dosing weight = 88.8 kg Heparin goal of 0.3 to 0.5.   Plan: Heparin (NO BOLUS) at 1400 units/hr.  Heparin level in 6 hours.  Daily heparin level and CBC.   Sloan Leiter, PharmD, BCPS Clinical Pharmacist [phone] 06/14/2014, 12:12 PM

## 2014-06-14 NOTE — Significant Event (Signed)
Rapid Response Event Note  Overview: Time Called: 0935 Arrival Time: 7262 Event Type: Other (Comment)  Initial Focused Assessment: Patient states that he feels "terible"  He feels weak and tired.  He states he had a good night and was up walking this morning about 6am.  Since then he feels he has had a rapid decline. BP 125/81  ST 128  RR 28  O2 sat 94 on 2l [place] Temp 100.3 oral  (0735 temp 101.2) Lung sounds clear, decreased bases, Heart tones regular. (+) Bowel sounds Pt received tylenol prior to my arrival.   Interventions: Dr Hal Neer at bedside to assess patient 20 gauge PIV placed left arm good blood return good flush  BP 137/74  ST 123  RR 36  O2 sat 96 on 2l   Temp 99.5 Patient continues to feel uncomfortable and SOB,  Denies chest pain.  Does endorse some back soreness. Tiffany Kocher NP at bedside to assess patient. Lab orders received.  Plan transfer patient to [phone]  Notified father of patient transfer, no answer at home or on mobile phone.  Left message.  Event Summary: Name of Physician Notified: Dr Hal Neer at [phone]  Name of Consulting Physician Notified: Dr Nelda Marseille at [address], [place]

## 2014-06-14 NOTE — Progress Notes (Addendum)
ANTIBIOTIC CONSULT NOTE - INITIAL  Pharmacy Consult for Vancomycin, Zosyn Indication: rule out pneumonia  Allergies  Allergen Reactions  . Fentanyl Other (See Comments)    Patient developed severe chest wall rigidity following IV dose of Fentanyl for post op pain management. Reaction occurred on first and second administration of Fentanyl intraop producing significant drop in SaO2 and increased airway pressures.  . Morphine Swelling    Patient Measurements: Height: 5\' 9"  (175.3 cm) Weight: 198 lb (89.812 kg) IBW/kg (Calculated) : 70.7 Vital Signs: Temp: 99.5 F (37.5 C) (05/19 1028) Temp Source: Oral (05/19 0939) BP: 137/74 mmHg (05/19 1028) Pulse Rate: 123 (05/19 1028) Intake/Output from previous day: 05/18 0701 - 05/19 0700 In: 880 [P.O.:880] Out: 1000 [Urine:1000] Intake/Output from this shift: Total I/O In: -  Out: 300 [Urine:300]  Labs: No results for input(s): WBC, HGB, PLT, LABCREA, CREATININE in the last 72 hours. Estimated Creatinine Clearance: 93.8 mL/min (by C-G formula based on Cr of 1.31). No results for input(s): VANCOTROUGH, VANCOPEAK, VANCORANDOM, GENTTROUGH, GENTPEAK, GENTRANDOM, TOBRATROUGH, TOBRAPEAK, TOBRARND, AMIKACINPEAK, AMIKACINTROU, AMIKACIN in the last 72 hours.   Microbiology: Recent Results (from the past 720 hour(s))  Surgical pcr screen     Status: Abnormal   Collection Time: 06/04/14 10:04 AM  Result Value Ref Range Status   MRSA, PCR NEGATIVE NEGATIVE Final   Staphylococcus aureus POSITIVE (A) NEGATIVE Final    Comment:        The Xpert SA Assay (FDA approved for NASAL specimens in patients over 49 years of age), is one component of a comprehensive surveillance program.  Test performance has been validated by [facility] for patients greater than or equal to 53 year old. It is not intended to diagnose infection nor to guide or monitor treatment.     Medical History: Past Medical History  Diagnosis Date  . ADD (attention  deficit disorder)   . GERD (gastroesophageal reflux disease)   . Hypertension   . DVT (deep venous thrombosis) 2013  . PE (pulmonary embolism) 13    hx  . Arthritis     Medications:  Anti-infectives    Start     Dose/Rate Route Frequency Ordered Stop   06/12/14 1600  ceFAZolin (ANCEF) IVPB 2 g/50 mL premix     2 g 100 mL/hr over 30 Minutes Intravenous Every 8 hours 06/12/14 1258 06/13/14 0031   06/12/14 0957  vancomycin (VANCOCIN) powder  Status:  Discontinued       As needed 06/12/14 0958 06/12/14 1133   06/12/14 0901  vancomycin (VANCOCIN) 1000 MG powder    Comments:  Latricia Heft   : cabinet override      06/12/14 0901 06/12/14 2114   06/12/14 0828  bacitracin 50,000 Units in sodium chloride irrigation 0.9 % 500 mL irrigation  Status:  Discontinued       As needed 06/12/14 0829 06/12/14 1133   06/12/14 0700  ceFAZolin (ANCEF) IVPB 2 g/50 mL premix     2 g 100 mL/hr over 30 Minutes Intravenous To Surgery 06/11/14 1222 06/12/14 0755     Assessment: 29 year old known to [place] for anticoagulant dosing with new fever, tachycardia, and infiltrates on chest xray to start IV vancomycin and Zosyn per pharmacy dosing for rule out pneumonia.   Goal of Therapy:  Vancomycin trough level 15-20 mcg/ml Clinical resolution of infection  Plan:  Zosyn 3.375g IV now over 30 minutes, then 3.375g IV every 8 hours each dose over 4 hrs.  Vancomycin 1500mg  IV x1,  then 1g IV every 8 hours.  Follow-up renal function, culture results, and clinical status.   Sloan Leiter, PharmD, BCPS Clinical Pharmacist [phone] 06/14/2014,10:57 AM

## 2014-06-14 NOTE — Progress Notes (Signed)
Pt OOB walking, after he got back in bed called for pain & Sleeping med

## 2014-06-14 NOTE — Progress Notes (Signed)
Patient noted with low grade temp and trichocardiac during the night. Temp this morning at 101.2. MD notified, orders placed and carried out.

## 2014-06-14 NOTE — Consult Note (Signed)
Name: Andre Cervantes MRN: [id number] DOB: June 29, 1985    ADMISSION DATE:  06/12/2014 CONSULTATION DATE:  5/19  REFERRING MD :  Hal Neer   CHIEF COMPLAINT:  dyspnea  BRIEF PATIENT DESCRIPTION:  29 yo male with hx HTN, PE (after knee surgery 2014, lupus anticoagulant POS now on lifelong ocumadin) admitted 5/17 for elective L5-S1 fusion.  On 5/19 developed progressive SOB, fevers and PCCM consulted.   SIGNIFICANT EVENTS    STUDIES:  CXR 5/19>>> bilat infiltrates R>L   HISTORY OF PRESENT ILLNESS:  29 yo male with hx HTN, PE (after knee surgery 2014, lupus anticoagulant POS now on lifelong coumadin) admitted 5/17 for elective L5-S1 fusion.  On 5/19 developed progressive SOB, fevers and PCCM consulted.  Pt was ambulated this am at [place], did well but began having some mild SOB which has progressed throughout the morning.  Now with fever 101.2.  C/o worsening SOB, nonproductive cough, malaise, chills.  Denies chest pain.  C/o mild back pain/soreness at surgical site but denies other back pain or leg pain.  Coumadin was stopped 5/11 prior to planned surgery.    PAST MEDICAL HISTORY :   has a past medical history of ADD (attention deficit disorder); GERD (gastroesophageal reflux disease); Hypertension; DVT (deep venous thrombosis) (2013); PE (pulmonary embolism) (13); and Arthritis.  has past surgical history that includes Knee surgery (Bilateral, 2013); rt hand little finger pinning (childhood); Knee arthroscopy with lateral release (Right, 01/05/2013); Medial patellofemoral ligament repair (Right, 01/05/2013); and Fracture surgery. Prior to Admission medications   Medication Sig Start Date End Date Taking? Authorizing Provider  acetaminophen (TYLENOL) 500 MG tablet Take 1,000 mg by mouth every 8 (eight) hours as needed for moderate pain.   Yes Historical Provider, MD  amitriptyline (ELAVIL) 25 MG tablet Take 50 mg by mouth at bedtime.    Yes Historical Provider, MD  atenolol (TENORMIN) 50 MG  tablet Take 50 mg by mouth daily.   Yes Historical Provider, MD  fenofibrate micronized (LOFIBRA) 134 MG capsule Take 134 mg by mouth at bedtime.    Yes Historical Provider, MD  Gabapentin Enacarbil ER 300 MG TBCR Take 1 capsule by mouth 2 (two) times daily as needed (Takes 1 daily, can take another if needed).   Yes Historical Provider, MD  Gabapentin Enacarbil ER 300 MG TBCR Take 300 mg by mouth 2 (two) times daily.   Yes Historical Provider, MD  loratadine (CLARITIN) 10 MG tablet Take 10 mg by mouth daily.   Yes Historical Provider, MD  lovastatin (MEVACOR) 40 MG tablet Take 40 mg by mouth at bedtime.   Yes Historical Provider, MD  montelukast (SINGULAIR) 10 MG tablet Take 10 mg by mouth at bedtime.   Yes Historical Provider, MD  omeprazole (PRILOSEC) 20 MG capsule Take 20 mg by mouth daily.    Yes Historical Provider, MD  risperiDONE (RISPERDAL) 1 MG tablet Take 3 mg by mouth at bedtime.    Yes Historical Provider, MD  tamsulosin (FLOMAX) 0.4 MG CAPS capsule Take 0.4 mg by mouth daily.    Yes Historical Provider, MD  warfarin (COUMADIN) 4 MG tablet Take 4 mg by mouth every other day. Currently alternates with 6 mg   Yes Historical Provider, MD  warfarin (COUMADIN) 6 MG tablet Take 6 mg by mouth every other day. Currently alternates with 4 mg   Yes Historical Provider, MD  enoxaparin (LOVENOX) 30 MG/0.3ML injection Inject 0.3 mLs (30 mg total) into the skin every 12 (twelve) hours. Patient not taking:  Reported on 05/30/2014 01/06/13   Bryson L Stilwell, PA-C  methocarbamol (ROBAXIN) 500 MG tablet Take 1 tablet (500 mg total) by mouth every 6 (six) hours as needed for muscle spasms. Patient not taking: Reported on 05/30/2014 01/06/13   Sueanne Margarita Stilwell, PA-C  oxyCODONE-acetaminophen (PERCOCET/ROXICET) 5-325 MG per tablet Take 1-2 tablets by mouth every 4 (four) hours as needed for moderate pain. Patient not taking: Reported on 05/30/2014 01/06/13   Sueanne Margarita Stilwell, PA-C   Allergies  Allergen  Reactions  . Fentanyl Other (See Comments)    Patient developed severe chest wall rigidity following IV dose of Fentanyl for post op pain management. Reaction occurred on first and second administration of Fentanyl intraop producing significant drop in SaO2 and increased airway pressures.  . Morphine Swelling    FAMILY HISTORY:  family history is not on file. SOCIAL HISTORY:  reports that he has never smoked. His smokeless tobacco use includes Chew. He reports that he drinks alcohol. He reports that he uses illicit drugs (Other-see comments).  REVIEW OF SYSTEMS:   As per HPI - All other systems reviewed and were neg.    SUBJECTIVE:   VITAL SIGNS: Temp:  [99.5 F (37.5 C)-101.2 F (38.4 C)] 99.5 F (37.5 C) (05/19 1028) Pulse Rate:  [99-131] 123 (05/19 1028) Resp:  [18-36] 36 (05/19 1028) BP: (109-137)/(56-96) 137/74 mmHg (05/19 1028) SpO2:  [91 %-100 %] 95 % (05/19 1028)  PHYSICAL EXAMINATION: General:  Young wdwn male, uncomfortable appearing on [place] Neuro:  Awake, alert, appropriate, MAE HEENT:  Mm moist, no JVD  Cardiovascular:  s1s2 rrr  Lungs:  resps mildly labored, tachypneic, diminished bilat bases, few scattered rhonchi, no audible wheeze or crackles  Abdomen:  Soft, +bs, non tender  Musculoskeletal:  Warm and dry, no edema, -homans, lower back surgical incision with drain, c/d, scant amount serosang drainage  No results for input(s): NA, K, CL, CO2, BUN, CREATININE, GLUCOSE in the last 168 hours. No results for input(s): HGB, HCT, WBC, PLT in the last 168 hours. Dg Chest Port 1 View  06/14/2014   CLINICAL DATA:  Shortness of Breath  EXAM: PORTABLE CHEST - 1 VIEW  COMPARISON:  04/13/2013  FINDINGS: Cardiac shadow is mildly enlarged but accentuated by the portable technique. Bibasilar infiltrates are noted right greater than left likely with a component of pleural fluid. No bony abnormality is seen.  IMPRESSION: Bibasilar infiltrates right greater than left.    Electronically Signed   By: Inez Catalina M.D.   On: 06/14/2014 08:54    ASSESSMENT / PLAN:  Dyspnea  Hypoxia - there is certainly concern for PE given his hx, however considering new fever and bilat infiltrates, suspect dyspnea and hypoxia are r/t HCAP.   HCAP  Hx PE -- lupus anticoag POS.  On lifelong coumadin.   PLAN -  - tx SDU -- will go to 38M for SD overflow  - IV abx for HCAP  - Check pct  - Pulmonary hygiene  - check lactate  - F/u CXR  - Supplemental O2 as needed to keep sats >92%  - Resume Coumadin per pharmacy per nsgy  - Awaiting call back from neurosurgery to discuss ?heparin gtt.  Not ideal only 2 days post op but would prefer to start short acting anticoagulation while awaiting for INR to be therapeutic especially if concern for PE.  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  Nurses need to compress and empty surgical drain q1 hour instead of q4.  Will start heparin gtt, hold off on CTA chest for now (will not change management) and monitor in SDU with IV abx.    Nickolas Madrid, NP 06/14/2014  11:26 AM Pager: [phone] or [phone]  29 year old with lupus anti-coagulant and PE history who presented to the hospital for back surgery.  2 days post op the patient was noted to have severe tachypnea and tachycardia.  Primary assumed PE and asked PCCM to see.  Upon evaluating the patient, tachypnea was noted as well as diaphoreses and fever were noted.  CXR that I reviewed myself showed R>L infiltrate consistent with PNA.  Patient has been off  anticoagulation however and recurrent PE can be a possibility here.  Given the fact that patient is not a lytic candidate for now ordering a CTA will little alter the management.  Case discussed with Dr. Hal Neer and heparin was ok to use.  Will treat as HCAP and pan culture then narrow as cultures result.  In the meantime, given respiratory rate and effort will transfer patient to the ICU for closer observation.  High risk of intubation given respiratory rate.  The patient is critically ill with multiple organ systems failure and requires high complexity decision making for assessment and support, frequent evaluation and titration of therapies, application of advanced monitoring technologies and extensive interpretation of multiple databases.   Critical Care Time devoted to patient care services described in this note is  35  Minutes. This time reflects time of care of this signee Dr Jennet Maduro. This critical care time does not reflect procedure time, or teaching time or supervisory time of PA/NP/Med student/Med Resident etc but could involve care discussion time.  Rush Farmer, M.D. [facility] Pulmonary/Critical Care Medicine. Pager: [phone]. After hours pager: [phone].

## 2014-06-14 NOTE — Progress Notes (Signed)
Patient is transferred from room 4N03 to unit 3MW 03 at this time.

## 2014-06-14 NOTE — Care Management Note (Signed)
Case Management Note  Patient Details  Name: KHARY SCHABEN MRN: [id number] Date of Birth: January 29, 1985  Subjective/Objective:                  Pt s/p posterior lumbar fusion on 06/12/14.  PTA, pt independent of ADLS.   Action/Plan: Will follow post op for discharge planning.  Await therapy recommendations.   Expected Discharge Date:   (pending)               Expected Discharge Plan:  [place]  In-House Referral:     Discharge planning Services  CM Consult  Post Acute Care Choice:    Choice offered to:     DME Arranged:    DME Agency:     HH Arranged:    HH Agency:     Status of Service:  In process, will continue to follow  Medicare Important Message Given:    Date Medicare IM Given:    Medicare IM give by:    Date Additional Medicare IM Given:    Additional Medicare Important Message give by:     If discussed at [place] of Stay Meetings, dates discussed:    Additional Comments:  Ella Bodo, RN 06/14/2014, 3:58 PM Phone [phone]

## 2014-06-14 NOTE — Progress Notes (Signed)
ANTICOAGULATION CONSULT NOTE - Follow Up Consult  Pharmacy Consult for Heparin Indication: Hx DVT and PE  Allergies  Allergen Reactions  . Fentanyl Other (See Comments)    Patient developed severe chest wall rigidity following IV dose of Fentanyl for post op pain management. Reaction occurred on first and second administration of Fentanyl intraop producing significant drop in SaO2 and increased airway pressures.  . Morphine Swelling    Patient Measurements: Height: 5\' 9"  (175.3 cm) Weight: 198 lb (89.812 kg) IBW/kg (Calculated) : 70.7  Vital Signs: Temp: 99.8 F (37.7 C) (05/19 1551) Temp Source: Oral (05/19 1551) BP: 122/77 mmHg (05/19 1800) Pulse Rate: 104 (05/19 1800)  Labs:  Recent Labs  06/12/14 0654 06/14/14 1200 06/14/14 1920  HGB  --  12.7*  --   HCT  --  38.6*  --   PLT  --  375  --   LABPROT 14.2  --   --   INR 1.08  --   --   HEPARINUNFRC  --   --  0.15*  CREATININE  --  1.10  --     Estimated Creatinine Clearance: 111.7 mL/min (by C-G formula based on Cr of 1.1).   Medications:  Heparin @ 1400 units/hr  Assessment: 28yom started on IV heparin earlier today for possible PE in the setting of subtherapeutic INR. Initial heparin level is below goal at 0.15. Aiming for a lower goal since he is POD #2 spinal surgery.  Goal of Therapy:  Heparin level 0.3-0.5 units/ml Monitor platelets by anticoagulation protocol: Yes   Plan:  1) Increase heparin to 1600 units/hr 2) Check heparin level in 6 hours  Deboraha Sprang 06/14/2014,7:54 PM

## 2014-06-14 NOTE — Progress Notes (Signed)
Patient ID: Andre Cervantes, male   DOB: 01-12-1986, 29 y.o.   MRN: [id number] Subjective: Patient reports some mild shortness of breath  Objective: Vital signs in last 24 hours: Temp:  [99.7 F (37.6 C)-101.2 F (38.4 C)] 100.3 F (37.9 C) (05/19 0939) Pulse Rate:  [99-131] 131 (05/19 0939) Resp:  [18-28] 28 (05/19 0945) BP: (109-137)/(56-96) 125/81 mmHg (05/19 0939) SpO2:  [91 %-100 %] 94 % (05/19 0939)  Intake/Output from previous day: 05/18 0701 - 05/19 0700 In: 880 [P.O.:880] Out: 1000 [Urine:1000] Intake/Output this shift: Total I/O In: -  Out: 300 [Urine:300]  awake, alert, mildly tachypneic, but not in much distress; speaking comfortably and does not appear anxious  Lab Results: No results for input(s): WBC, HGB, HCT, PLT in the last 72 hours. BMET No results for input(s): NA, K, CL, CO2, GLUCOSE, BUN, CREATININE, CALCIUM in the last 72 hours.  Studies/Results: Dg Lumbar Spine 2-3 Views  06/12/2014   CLINICAL DATA:  L5-S1 PLIF.  Spondylolisthesis.  EXAM: DG C-ARM 61-120 MIN; LUMBAR SPINE - 2-3 VIEW  COMPARISON:  06/12/2014.  04/11/2014.  FINDINGS: Posterior rod and pedicle screw fixation at L5-S1 with discectomy. Interbody bone graft. AP and lateral fluoroscopic spot film projections are submitted for interpretation.  IMPRESSION: L5-S1 PLIF.   Electronically Signed   By: Dereck Ligas M.D.   On: 06/12/2014 11:40   Dg Chest Port 1 View  06/14/2014   CLINICAL DATA:  Shortness of Breath  EXAM: PORTABLE CHEST - 1 VIEW  COMPARISON:  04/13/2013  FINDINGS: Cardiac shadow is mildly enlarged but accentuated by the portable technique. Bibasilar infiltrates are noted right greater than left likely with a component of pleural fluid. No bony abnormality is seen.  IMPRESSION: Bibasilar infiltrates right greater than left.   Electronically Signed   By: Inez Catalina M.D.   On: 06/14/2014 08:54   Dg C-arm 61-120 Min  06/12/2014   CLINICAL DATA:  L5-S1 PLIF.  Spondylolisthesis.  EXAM:  DG C-ARM 61-120 MIN; LUMBAR SPINE - 2-3 VIEW  COMPARISON:  06/12/2014.  04/11/2014.  FINDINGS: Posterior rod and pedicle screw fixation at L5-S1 with discectomy. Interbody bone graft. AP and lateral fluoroscopic spot film projections are submitted for interpretation.  IMPRESSION: L5-S1 PLIF.   Electronically Signed   By: Dereck Ligas M.D.   On: 06/12/2014 11:40    Assessment/Plan: I am concerned that with his history of DVT, he may have had a PE. I have consulted CCM. I asked about any orders, but they are coming to see him shortly and they just suggested that we wait until they assess him before starting any test orders or modalities.   LOS: 2 days  as above   [name], MD 06/14/2014, 10:14 AM

## 2014-06-15 ENCOUNTER — Inpatient Hospital Stay (HOSPITAL_COMMUNITY): Payer: Medicare Other

## 2014-06-15 DIAGNOSIS — I2699 Other pulmonary embolism without acute cor pulmonale: Secondary | ICD-10-CM

## 2014-06-15 LAB — HEPARIN LEVEL (UNFRACTIONATED)
HEPARIN UNFRACTIONATED: 0.3 [IU]/mL (ref 0.30–0.70)
Heparin Unfractionated: 0.15 IU/mL — ABNORMAL LOW (ref 0.30–0.70)
Heparin Unfractionated: 0.37 IU/mL (ref 0.30–0.70)

## 2014-06-15 LAB — CBC
HCT: 36.1 % — ABNORMAL LOW (ref 39.0–52.0)
Hemoglobin: 11.7 g/dL — ABNORMAL LOW (ref 13.0–17.0)
MCH: 30 pg (ref 26.0–34.0)
MCHC: 32.4 g/dL (ref 30.0–36.0)
MCV: 92.6 fL (ref 78.0–100.0)
Platelets: 345 10*3/uL (ref 150–400)
RBC: 3.9 MIL/uL — AB (ref 4.22–5.81)
RDW: 12.5 % (ref 11.5–15.5)
WBC: 17.6 10*3/uL — ABNORMAL HIGH (ref 4.0–10.5)

## 2014-06-15 LAB — PROTIME-INR
INR: 1.36 (ref 0.00–1.49)
PROTHROMBIN TIME: 16.9 s — AB (ref 11.6–15.2)

## 2014-06-15 MED ORDER — WARFARIN SODIUM 4 MG PO TABS
4.0000 mg | ORAL_TABLET | Freq: Once | ORAL | Status: AC
  Administered 2014-06-15: 4 mg via ORAL
  Filled 2014-06-15: qty 1

## 2014-06-15 NOTE — Progress Notes (Signed)
Name: Andre Cervantes MRN: [id number] DOB: [birth year]    ADMISSION DATE:  06/12/2014 CONSULTATION DATE:  5/19  REFERRING MD :  Hal Neer   CHIEF COMPLAINT:  dyspnea  BRIEF PATIENT DESCRIPTION:   29 yo male with hx HTN, PE (after knee surgery 2014, lupus anticoagulant POS now on lifelong coumadin) admitted 5/17 for elective L5-S1 fusion.  On 5/19 developed progressive SOB, fevers and PCCM consulted.  Pt was ambulated this am at [place], did well but began having some mild SOB which has progressed throughout the morning.  Now with fever 101.2.  C/o worsening SOB, nonproductive cough, malaise, chills.  Denies chest pain.  C/o mild back pain/soreness at surgical site but denies other back pain or leg pain.  Coumadin was stopped 5/11 prior to planned surgery.     SIGNIFICANT EVENTS  CXR 5/19>>> bilat infiltrates R>L   SUBJECTIVE:   06/15/2014 Hx from RN at bedside: overnight febrile to 103F, still tachypneic and tachycardic. On IV heparin.  Omn 3L [place] Per patient: Wants to go home asap.  VITAL SIGNS: Temp:  [98.3 F (36.8 C)-103 F (39.4 C)] 98.5 F (36.9 C) (05/20 0757) Pulse Rate:  [87-123] 120 (05/20 0945) Resp:  [17-47] 24 (05/20 0900) BP: (98-148)/(55-131) 116/70 mmHg (05/20 0945) SpO2:  [91 %-99 %] 96 % (05/20 0900)  PHYSICAL EXAMINATION: General:  Young wdwn male, trying to sleep Neuro:  Trying to sleep . Arousable . alert, appropriate, MAE HEENT:  Mm moist, no JVD  Cardiovascular:  s1s2 rrr  Lungs:  resps mildly labored, tachypneic, diminished bilat bases, few scattered rhonchi, no audible wheeze or crackles  Abdomen:  Soft, +bs, non tender  Musculoskeletal:  Warm and dry, no edema, -homans, lower back surgical incision with drain, c/d, scant amount serosang drainage   Variable  0 Points 1 Point 2 Points Total  Heart rate per minute  <90 beats 90-109 beats >110 beats 1  Respiratory  Rate per minute < 18 breaths 19-30 breaths  >30 breaths 1  Restlessness; nonpurposeful  movements None  occas slight movement Frequent movement 0  Paradoxical breathing pattern: None  Present 0  Accessory muscle use: rise in clavicle during inspiration None Slight rise Pronounced rise 1  Grunting at end-expiration: guttural sound None  Present 0  Nasal flaring: involuntary movement of nares None  Present 0  Look of fear None  Eyes wide 0  Overall total out of 16    3    Respiratory Distress Observation Scale Journal of Palliative Medicine Vol. 13, Number 3, 2010 Campbell et al.    PULMONARY No results for input(s): PHART, PCO2ART, PO2ART, HCO3, TCO2, O2SAT in the last 168 hours.  Invalid input(s): PCO2, PO2  CBC  Recent Labs Lab 06/14/14 1200 06/15/14 0218  HGB 12.7* 11.7*  HCT 38.6* 36.1*  WBC 20.7* 17.6*  PLT 375 345    COAGULATION  Recent Labs Lab 06/12/14 0654 06/15/14 0218  INR 1.08 1.36    CARDIAC  No results for input(s): TROPONINI in the last 168 hours. No results for input(s): PROBNP in the last 168 hours.   CHEMISTRY  Recent Labs Lab 06/14/14 1200  NA 139  K 4.1  CL 108  CO2 21*  GLUCOSE 134*  BUN 7  CREATININE 1.10  CALCIUM 9.5   Estimated Creatinine Clearance: 111.7 mL/min (by C-G formula based on Cr of 1.1).   LIVER  Recent Labs Lab 06/12/14 0654 06/15/14 0218  INR 1.08 1.36     INFECTIOUS  Recent  Labs Lab 06/14/14 1200 06/14/14 1532  LATICACIDVEN  --  1.6  PROCALCITON 0.41  --      ENDOCRINE CBG (last 3)   Recent Labs  06/14/14 0941  GLUCAP 193*         IMAGING x48h  - image(s) personally visualized  -   highlighted in bold - in my view and opinion CXR is worse with right atlectasis Dg Chest Port 1 View  06/15/2014   CLINICAL DATA:  Pneumonia.  EXAM: PORTABLE CHEST - 1 VIEW  COMPARISON:  06/14/2014  FINDINGS: There is increased volume loss of the right lung with airspace disease present in both upper and lower lung zones. Relatively stable atelectasis versus infiltrate at the left lung  base. No significant component of pleural fluid. No overt pulmonary edema identified. The heart size is normal.  IMPRESSION: Diminished aeration of the right lung with persistent infiltrates in the right lung. Stable atelectasis versus infiltrate at the left lung base.   Electronically Signed   By: Aletta Edouard M.D.   On: 06/15/2014 07:58   Dg Chest Port 1 View  06/14/2014   CLINICAL DATA:  Shortness of Breath  EXAM: PORTABLE CHEST - 1 VIEW  COMPARISON:  04/13/2013  FINDINGS: Cardiac shadow is mildly enlarged but accentuated by the portable technique. Bibasilar infiltrates are noted right greater than left likely with a component of pleural fluid. No bony abnormality is seen.  IMPRESSION: Bibasilar infiltrates right greater than left.   Electronically Signed   By: Inez Catalina M.D.   On: 06/14/2014 08:54       ASSESSMENT / PLAN:  #[place]  - known lupus anticoagulant with hx of PE - on life long couamding  #Current  - Acute Respiratory Failure with Hypoxia and Rt sided HHCAP RTside    - 06/15/2014: persistent SIRS and worsening CXR. 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 Brand Males, M.D., [facility].C.P Pulmonary and Critical Care Medicine Staff Physician [place] [facility] Pager: [phone], If no answer or between  15:00h - 7:00h: call [phone]  06/15/2014 10:16 AM

## 2014-06-15 NOTE — Progress Notes (Signed)
ANTICOAGULATION CONSULT NOTE - Follow Up Consult  Pharmacy Consult for heparin Indication: PE/DVT   Labs:  Recent Labs  06/12/14 0654 06/14/14 1200 06/14/14 1920 06/15/14 0218  HGB  --  12.7*  --  11.7*  HCT  --  38.6*  --  36.1*  PLT  --  375  --  345  LABPROT 14.2  --   --  16.9*  INR 1.08  --   --  1.36  HEPARINUNFRC  --   --  0.15* 0.15*  CREATININE  --  1.10  --   --      Assessment: 28yo male remains subtherapeutic on heparin with no change in level despite rate change; no gtt issues per RN.  Goal of Therapy:  Heparin level 0.3-0.5 units/ml   Plan:  Will increase heparin gtt by 3 units/kg/hr to 1900 units/hr and check level in 6hr.  Wynona Neat, PharmD, BCPS  06/15/2014,3:40 AM

## 2014-06-15 NOTE — Progress Notes (Signed)
Noted that the hemovac was not holding a suction when checked @ 0600. Pt moves in the bed a lot turning from side to side. Dr. Hal Neer notified of this happening.

## 2014-06-15 NOTE — Progress Notes (Addendum)
ANTICOAGULATION CONSULT NOTE - Follow Up Consult  Pharmacy Consult for heparin and Coumadin Indication: PE/DVT; Lupus anticoagulant + Heparin dosing weight = 88.8 kg  Labs:  Recent Labs  06/14/14 1200 06/14/14 1920 06/15/14 0218 06/15/14 1134  HGB 12.7*  --  11.7*  --   HCT 38.6*  --  36.1*  --   PLT 375  --  345  --   LABPROT  --   --  16.9*  --   INR  --   --  1.36  --   HEPARINUNFRC  --  0.15* 0.15* 0.37  CREATININE 1.10  --   --   --    Assessment: 29yo male with history of PE and DVT and Lupus anticoagulant positive on Coumadin and IV heparin bridge.   Heparin level this AM is therapeutic on 1900 units/hr. CBC with slight decrease but may be post-op anemia. Will follow. Platelets are within normal limits. INR today is up from 1.08 to 1.36 after home dose of 6mg . Home Coumadin dose is 4mg  and 6mg  alternating.   Goal of Therapy:  Heparin level 0.3-0.5 units/ml   Plan:  Continue heparin 1900 units/hr.  Repeat heparin level in 6 hours to confirm.  Coumadin 4mg  po x1 tonight.  Daily PT/INR.   Sloan Leiter, PharmD, BCPS Clinical Pharmacist [phone] 06/15/2014,12:23 PM    Heparin level is barely therapeutic on low end of range. Will slightly increase rate to keep therapeutic. No s/sx bleeding. Next level with AM labs.    Hughes Better, PharmD, BCPS Clinical Pharmacist Pager: [phone] 06/15/2014 9:28 PM

## 2014-06-15 NOTE — Progress Notes (Signed)
RT note- placed bipap per order with 2 hours on and 4 hours off, tolerating well.

## 2014-06-15 NOTE — Progress Notes (Signed)
RT notified regarding BiPAP order

## 2014-06-15 NOTE — Progress Notes (Signed)
Patient ID: Andre Cervantes, male   DOB: 13-Sep-1985, 29 y.o.   MRN: [id number] T max 103 last night. Now afebriile. Looks much more comfortable today. No respiratoy issues. His leg pain is much better. Wound checked and drain has become dislodged so I removed it. Will continue present rx per CCM. Transfer and d/c home per their recommendations.

## 2014-06-16 ENCOUNTER — Inpatient Hospital Stay (HOSPITAL_COMMUNITY): Payer: Medicare Other

## 2014-06-16 DIAGNOSIS — I2699 Other pulmonary embolism without acute cor pulmonale: Secondary | ICD-10-CM | POA: Insufficient documentation

## 2014-06-16 DIAGNOSIS — R0602 Shortness of breath: Secondary | ICD-10-CM

## 2014-06-16 DIAGNOSIS — M4326 Fusion of spine, lumbar region: Secondary | ICD-10-CM | POA: Insufficient documentation

## 2014-06-16 DIAGNOSIS — J189 Pneumonia, unspecified organism: Secondary | ICD-10-CM | POA: Insufficient documentation

## 2014-06-16 LAB — CBC
HCT: 34.5 % — ABNORMAL LOW (ref 39.0–52.0)
Hemoglobin: 11.4 g/dL — ABNORMAL LOW (ref 13.0–17.0)
MCH: 29.7 pg (ref 26.0–34.0)
MCHC: 33 g/dL (ref 30.0–36.0)
MCV: 89.8 fL (ref 78.0–100.0)
PLATELETS: 357 10*3/uL (ref 150–400)
RBC: 3.84 MIL/uL — ABNORMAL LOW (ref 4.22–5.81)
RDW: 12 % (ref 11.5–15.5)
WBC: 12.5 10*3/uL — ABNORMAL HIGH (ref 4.0–10.5)

## 2014-06-16 LAB — PROCALCITONIN: PROCALCITONIN: 0.36 ng/mL

## 2014-06-16 LAB — BASIC METABOLIC PANEL
Anion gap: 8 (ref 5–15)
BUN: 9 mg/dL (ref 6–20)
CO2: 23 mmol/L (ref 22–32)
Calcium: 8.9 mg/dL (ref 8.9–10.3)
Chloride: 105 mmol/L (ref 101–111)
Creatinine, Ser: 0.83 mg/dL (ref 0.61–1.24)
GFR calc Af Amer: >60 mL/min (ref >60)
GFR calc non Af Amer: >60 mL/min (ref >60)
Glucose, Bld: 141 mg/dL — ABNORMAL HIGH (ref 65–99)
POTASSIUM: 3.5 mmol/L (ref 3.5–5.1)
SODIUM: 136 mmol/L (ref 135–145)

## 2014-06-16 LAB — PROTIME-INR
INR: 1.14 (ref 0.00–1.49)
Prothrombin Time: 14.8 seconds (ref 11.6–15.2)

## 2014-06-16 LAB — PHOSPHORUS: PHOSPHORUS: 2.6 mg/dL (ref 2.5–4.6)

## 2014-06-16 LAB — HEPARIN LEVEL (UNFRACTIONATED): HEPARIN UNFRACTIONATED: 0.31 [IU]/mL (ref 0.30–0.70)

## 2014-06-16 LAB — MAGNESIUM: Magnesium: 1.9 mg/dL (ref 1.7–2.4)

## 2014-06-16 MED ORDER — WARFARIN SODIUM 7.5 MG PO TABS
7.5000 mg | ORAL_TABLET | Freq: Once | ORAL | Status: AC
  Administered 2014-06-16: 7.5 mg via ORAL
  Filled 2014-06-16: qty 1

## 2014-06-16 NOTE — Progress Notes (Signed)
Stable overnight. No new issues or problems. Patient's pain well controlled.  MAXIMUM TEMPERATURE 100.8. Currently afebrile. Urine output good. Awake and alert. Oriented and appropriate. Does not appear to be in any distress breathing. Motor and sensory exam stable. Wound clean and dry. Abdomen soft.  Doing well with regard to her spinal surgery. Respiratory issues ongoing. Defer management to CCM.

## 2014-06-16 NOTE — Progress Notes (Addendum)
Name: Andre Cervantes MRN: [id number] DOB: 07/06/1985    ADMISSION DATE:  06/12/2014 CONSULTATION DATE:  5/19  REFERRING MD :  Hal Neer   CHIEF COMPLAINT:  dyspnea  BRIEF PATIENT DESCRIPTION:   29 yo male with hx HTN, PE (after knee surgery 2014, lupus anticoagulant POS now on lifelong coumadin) admitted 5/17 for elective L5-S1 fusion.  On 5/19 developed progressive SOB, fevers and PCCM consulted.  Pt was ambulated this am at [place], did well but began having some mild SOB which has progressed throughout the morning.  Now with fever 101.2.  C/o worsening SOB, nonproductive cough, malaise, chills.  Denies chest pain.  C/o mild back pain/soreness at surgical site but denies other back pain or leg pain.  Coumadin was stopped 5/11 prior to planned surgery.     SIGNIFICANT EVENTS  CXR 5/19>>> bilat infiltrates R>L   SUBJECTIVE: off BIPAP now  06/15/2014 Hx from RN at bedside: overnight febrile to 103F, still tachypneic and tachycardic. On IV heparin.  Omn 3L [place] Per patient: Wants to go home asap.    VITAL SIGNS: Temp:  [98.1 F (36.7 C)-100.8 F (38.2 C)] 98.1 F (36.7 C) (05/21 0800) Pulse Rate:  [87-120] 102 (05/21 1000) Resp:  [15-31] 20 (05/21 1000) BP: (101-130)/(49-83) 111/70 mmHg (05/21 1000) SpO2:  [94 %-100 %] 97 % (05/21 1000) FiO2 (%):  [35 %] 35 % (05/20 2348)  PHYSICAL EXAMINATION: General:  Young wdwn male, no distress, does breath through mouth Neuro: awakens, good strength equal HEENT:  Mm moist, no JVD  Cardiovascular:  s1s2 rrr  Lungs:  CTa, reduced bases Abdomen:  Soft, +bs, non tender  Musculoskeletal:  Warm and dry, no edema, lower back surgical incision with No drain, dry     PULMONARY No results for input(s): PHART, PCO2ART, PO2ART, HCO3, TCO2, O2SAT in the last 168 hours.  Invalid input(s): PCO2, PO2  CBC  Recent Labs Lab 06/14/14 1200 06/15/14 0218 06/16/14 0518  HGB 12.7* 11.7* 11.4*  HCT 38.6* 36.1* 34.5*  WBC 20.7* 17.6* 12.5*  PLT  375 345 357    COAGULATION  Recent Labs Lab 06/12/14 0654 06/15/14 0218 06/16/14 0518  INR 1.08 1.36 1.14    CARDIAC  No results for input(s): TROPONINI in the last 168 hours. No results for input(s): PROBNP in the last 168 hours.   CHEMISTRY  Recent Labs Lab 06/14/14 1200 06/16/14 0518  NA 139 136  K 4.1 3.5  CL 108 105  CO2 21* 23  GLUCOSE 134* 141*  BUN 7 9  CREATININE 1.10 0.83  CALCIUM 9.5 8.9  MG  --  1.9  PHOS  --  2.6   Estimated Creatinine Clearance: 148.1 mL/min (by C-G formula based on Cr of 0.83).   LIVER  Recent Labs Lab 06/12/14 0654 06/15/14 0218 06/16/14 0518  INR 1.08 1.36 1.14     INFECTIOUS  Recent Labs Lab 06/14/14 1200 06/14/14 1532 06/16/14 0518  LATICACIDVEN  --  1.6  --   PROCALCITON 0.41  --  0.36     ENDOCRINE CBG (last 3)   Recent Labs  06/14/14 0941  GLUCAP 193*     IMAGING x48h  - image(s) personally visualized  -   highlighted in bold - in my view and opinion CXR is worse with right atlectasis Dg Chest Port 1 View  06/15/2014   CLINICAL DATA:  Pneumonia.  EXAM: PORTABLE CHEST - 1 VIEW  COMPARISON:  06/14/2014  FINDINGS: There is increased volume loss of the  right lung with airspace disease present in both upper and lower lung zones. Relatively stable atelectasis versus infiltrate at the left lung base. No significant component of pleural fluid. No overt pulmonary edema identified. The heart size is normal.  IMPRESSION: Diminished aeration of the right lung with persistent infiltrates in the right lung. Stable atelectasis versus infiltrate at the left lung base.   Electronically Signed   By: Aletta Edouard M.D.   On: 06/15/2014 07:58    ASSESSMENT / PLAN:  #[place]  - known lupus anticoagulant with hx of PE - on life long couamding  #Current  - Acute Respiratory Failure with Hypoxia and Rt sided HHCAP RTside  -ATX? ALI? bilat infiltrates -r/o edema  PLAN -  - will assess pcxr now, last one done with  some escalation with infiltrates -IS -upright as able -no WOB , no need NIMV daytime at this stage -consider neg balance goals -clinical presentation, infiltrates, fever, NOT c/w PE  -assess dopler legs to help with neg pred value -pcxr now, then assess am needs -to goal sat 94% -PCT unimpressive -if O2 needs increase - lasix and echo  # Hypertension  PLAN -  atenolol Monitor BP   #Sepsis due to Pneumonia Possible  - IV antibiotic to continue - pct unimpressive -remains culture neg Follow cultures in am and clinical status, may be able to narrow or dc abx  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 Titus Mould, MD, [place] Pgr: [facility]

## 2014-06-16 NOTE — Progress Notes (Signed)
VASCULAR LAB PRELIMINARY  PRELIMINARY  PRELIMINARY  PRELIMINARY  Bilateral lower extremity venous Dopplers completed.    Preliminary report:  There is no obvious evidence of DVT or SVT noted in the bilateral lower extremities.   Supreme Rybarczyk, RVT 06/16/2014, 5:10 PM

## 2014-06-16 NOTE — Progress Notes (Signed)
ANTICOAGULATION CONSULT NOTE - Follow Up Consult  Pharmacy Consult for Heparin and warfarin Indication: Hx DVT and PE with positive Lupus Anticoagulant  Allergies  Allergen Reactions  . Fentanyl Other (See Comments)    Patient developed severe chest wall rigidity following IV dose of Fentanyl for post op pain management. Reaction occurred on first and second administration of Fentanyl intraop producing significant drop in SaO2 and increased airway pressures.  . Morphine Swelling    Patient Measurements: Height: 5\' 9"  (175.3 cm) Weight: 198 lb (89.812 kg) IBW/kg (Calculated) : 70.7  Vital Signs: Temp: 98.1 F (36.7 C) (05/21 0400) Temp Source: Oral (05/21 0400) BP: 119/70 mmHg (05/21 0600) Pulse Rate: 99 (05/21 0600)  Labs:  Recent Labs  06/14/14 1200  06/15/14 0218 06/15/14 1134 06/15/14 2028 06/16/14 0518  HGB 12.7*  --  11.7*  --   --  11.4*  HCT 38.6*  --  36.1*  --   --  34.5*  PLT 375  --  345  --   --  357  LABPROT  --   --  16.9*  --   --  14.8  INR  --   --  1.36  --   --  1.14  HEPARINUNFRC  --   < > 0.15* 0.37 0.30 0.31  CREATININE 1.10  --   --   --   --  0.83  < > = values in this interval not displayed.  Estimated Creatinine Clearance: 148.1 mL/min (by C-G formula based on Cr of 0.83).   Medications:  Heparin @ 2000 units/hr  Assessment: 28yom who was on warfarin PTA, but it was held for planned spinal surgery (last dose PTA 5/11). On POD#2, he had chest pain and IV heparin was started for possible PE, along with antibiotics for possible PNA. Continues on IV heparin as bridge to therapeutic warfarin. Heparin level this morning remains therapeutic at 0.31 units/mL- neurosurgery prefers patient on lower end of goal. INR dropped this morning to 1.14 after being up to 1.36 yesterday. Dose was given last night as ordered. Hgb 11.4, plts 357- stable, no bleeding noted.  Goal of Therapy:  INR 2-3 Heparin level 0.3-0.5 units/ml Monitor platelets by  anticoagulation protocol: Yes   Plan:  -continue heparin at 2000 units/hr  -warfarin 7.5mg  po x1 tonight -daily HL, CBC and INR -follow for s/s bleeding  Aristeo Hankerson D. Pallavi Clifton, PharmD, BCPS Clinical Pharmacist Pager: [phone] 06/16/2014 8:57 AM

## 2014-06-17 ENCOUNTER — Inpatient Hospital Stay (HOSPITAL_COMMUNITY): Payer: Medicare Other

## 2014-06-17 LAB — COMPREHENSIVE METABOLIC PANEL
ALT: 106 U/L — AB (ref 17–63)
AST: 56 U/L — AB (ref 15–41)
Albumin: 2.8 g/dL — ABNORMAL LOW (ref 3.5–5.0)
Alkaline Phosphatase: 46 U/L (ref 38–126)
Anion gap: 11 (ref 5–15)
BUN: 13 mg/dL (ref 6–20)
CO2: 21 mmol/L — ABNORMAL LOW (ref 22–32)
Calcium: 9.2 mg/dL (ref 8.9–10.3)
Chloride: 107 mmol/L (ref 101–111)
Creatinine, Ser: 1.02 mg/dL (ref 0.61–1.24)
GFR calc Af Amer: >60 mL/min (ref >60)
Glucose, Bld: 98 mg/dL (ref 65–99)
Potassium: 3.7 mmol/L (ref 3.5–5.1)
SODIUM: 139 mmol/L (ref 135–145)
Total Bilirubin: 0.4 mg/dL (ref 0.3–1.2)
Total Protein: 7.3 g/dL (ref 6.5–8.1)

## 2014-06-17 LAB — CBC WITH DIFFERENTIAL/PLATELET
Basophils Absolute: 0.1 10*3/uL (ref 0.0–0.1)
Basophils Relative: 1 % (ref 0–1)
Eosinophils Absolute: 0.5 10*3/uL (ref 0.0–0.7)
Eosinophils Relative: 4 % (ref 0–5)
HEMATOCRIT: 35.4 % — AB (ref 39.0–52.0)
HEMOGLOBIN: 12 g/dL — AB (ref 13.0–17.0)
Lymphocytes Relative: 41 % (ref 12–46)
Lymphs Abs: 5.7 10*3/uL — ABNORMAL HIGH (ref 0.7–4.0)
MCH: 30.7 pg (ref 26.0–34.0)
MCHC: 33.9 g/dL (ref 30.0–36.0)
MCV: 90.5 fL (ref 78.0–100.0)
Monocytes Absolute: 1.5 10*3/uL — ABNORMAL HIGH (ref 0.1–1.0)
Monocytes Relative: 11 % (ref 3–12)
NEUTROS ABS: 6 10*3/uL (ref 1.7–7.7)
Neutrophils Relative %: 43 % (ref 43–77)
PLATELETS: 402 10*3/uL — AB (ref 150–400)
RBC: 3.91 MIL/uL — AB (ref 4.22–5.81)
RDW: 12.1 % (ref 11.5–15.5)
WBC: 13.9 10*3/uL — ABNORMAL HIGH (ref 4.0–10.5)

## 2014-06-17 LAB — HEPARIN LEVEL (UNFRACTIONATED)
HEPARIN UNFRACTIONATED: 0.46 [IU]/mL (ref 0.30–0.70)
Heparin Unfractionated: 0.18 IU/mL — ABNORMAL LOW (ref 0.30–0.70)
Heparin Unfractionated: 0.38 IU/mL (ref 0.30–0.70)

## 2014-06-17 LAB — PROCALCITONIN: Procalcitonin: 0.25 ng/mL

## 2014-06-17 LAB — MAGNESIUM: Magnesium: 2.2 mg/dL (ref 1.7–2.4)

## 2014-06-17 LAB — PHOSPHORUS: PHOSPHORUS: 3.9 mg/dL (ref 2.5–4.6)

## 2014-06-17 LAB — PROTIME-INR
INR: 1.17 (ref 0.00–1.49)
PROTHROMBIN TIME: 15 s (ref 11.6–15.2)

## 2014-06-17 MED ORDER — WARFARIN SODIUM 7.5 MG PO TABS
7.5000 mg | ORAL_TABLET | Freq: Once | ORAL | Status: AC
Start: 2014-06-17 — End: 2014-06-17
  Administered 2014-06-17: 7.5 mg via ORAL
  Filled 2014-06-17: qty 1

## 2014-06-17 NOTE — Progress Notes (Signed)
Doing much better. Minimal back pain. No leg pain. Denies shortness of breath. Oxygen well.  Awake and alert. Oriented and appropriate. Motor and sensory function intact. Wound clean and dry. Abdomen soft.  Overall doing well. Ready for discharge when cleared from a pulmonary standpoint.

## 2014-06-17 NOTE — Progress Notes (Signed)
UR COMPLETED  

## 2014-06-17 NOTE — Discharge Instructions (Signed)

## 2014-06-17 NOTE — Progress Notes (Signed)
ANTICOAGULATION CONSULT NOTE - Follow Up Consult  Pharmacy Consult for heparin Indication: PE/DVT   Labs:  Recent Labs  06/14/14 1200  06/15/14 0218  06/15/14 2028 06/16/14 0518 06/17/14 0329  HGB 12.7*  --  11.7*  --   --  11.4* 12.0*  HCT 38.6*  --  36.1*  --   --  34.5* 35.4*  PLT 375  --  345  --   --  357 402*  LABPROT  --   --  16.9*  --   --  14.8 15.0  INR  --   --  1.36  --   --  1.14 1.17  HEPARINUNFRC  --   < > 0.15*  < > 0.30 0.31 0.18*  CREATININE 1.10  --   --   --   --  0.83  --   < > = values in this interval not displayed.   Assessment: 29yo male now subtherapeutic on heparin after three levels at goal; no gtt issues per RN.  Goal of Therapy:  Heparin level 0.3-0.5 units/ml   Plan:  Will increase heparin gtt by 10% to 2200 units/hr and check level in 6hr.  Wynona Neat, PharmD, BCPS  06/17/2014,4:58 AM

## 2014-06-17 NOTE — Progress Notes (Signed)
Name: Andre Cervantes MRN: [id number] DOB: March 05, 1985    ADMISSION DATE:  06/12/2014 CONSULTATION DATE:  5/19  REFERRING MD :  Hal Neer   CHIEF COMPLAINT:  dyspnea  BRIEF PATIENT DESCRIPTION:   29 yo male with hx HTN, PE (after knee surgery 2014, lupus anticoagulant POS now on lifelong coumadin) admitted 5/17 for elective L5-S1 fusion.  On 5/19 developed progressive SOB, fevers and PCCM consulted.  Pt was ambulated this am at [place], did well but began having some mild SOB which has progressed throughout the morning.  Now with fever 101.2.  C/o worsening SOB, nonproductive cough, malaise, chills.  Denies chest pain.  C/o mild back pain/soreness at surgical site but denies other back pain or leg pain.  Coumadin was stopped 5/11 prior to planned surgery.     SIGNIFICANT EVENTS  CXR 5/19>>> bilat infiltrates R>L Leg vein dopplers 5/21- neg  SUBJECTIVE: Says he feels "fantastic", denies cough or chest pain   VITAL SIGNS: Temp:  [97.5 F (36.4 C)-99.4 F (37.4 C)] 99.3 F (37.4 C) (05/22 0700) Pulse Rate:  [81-108] 88 (05/22 0326) Resp:  [18-22] 19 (05/22 0326) BP: (105-137)/(55-89) 124/64 mmHg (05/22 0326) SpO2:  [95 %-100 %] 97 % (05/22 0326) FiO2 (%):  [24 %] 24 % (05/21 1931) Weight:  [90.1 kg (198 lb 10.2 oz)] 90.1 kg (198 lb 10.2 oz) (05/21 1819)  PHYSICAL EXAMINATION: General:  Andre Cervantes wdwn male, no distress, does breath through mouth Neuro:  Alert, oriented HEENT:  Mm moist, no JVD  Cardiovascular:  s1s2 rrr  Lungs:  CTa, reduced bases, nasal O2 Abdomen:  Soft, +bs, non tender  Musculoskeletal:  Warm and dry, no edema, lower back surgical incision with No drain, dry  PULMONARY No results for input(s): PHART, PCO2ART, PO2ART, HCO3, TCO2, O2SAT in the last 168 hours.  Invalid input(s): PCO2, PO2  CBC  Recent Labs Lab 06/15/14 0218 06/16/14 0518 06/17/14 0329  HGB 11.7* 11.4* 12.0*  HCT 36.1* 34.5* 35.4*  WBC 17.6* 12.5* 13.9*  PLT 345 357 402*     COAGULATION  Recent Labs Lab 06/12/14 0654 06/15/14 0218 06/16/14 0518 06/17/14 0329  INR 1.08 1.36 1.14 1.17    CARDIAC  No results for input(s): TROPONINI in the last 168 hours. No results for input(s): PROBNP in the last 168 hours.   CHEMISTRY  Recent Labs Lab 06/14/14 1200 06/16/14 0518 06/17/14 0329  NA 139 136 139  K 4.1 3.5 3.7  CL 108 105 107  CO2 21* 23 21*  GLUCOSE 134* 141* 98  BUN 7 9 13   CREATININE 1.10 0.83 1.02  CALCIUM 9.5 8.9 9.2  MG  --  1.9 2.2  PHOS  --  2.6 3.9   Estimated Creatinine Clearance: 120.8 mL/min (by C-G formula based on Cr of 1.02).   LIVER  Recent Labs Lab 06/12/14 0654 06/15/14 0218 06/16/14 0518 06/17/14 0329  AST  --   --   --  56*  ALT  --   --   --  106*  ALKPHOS  --   --   --  46  BILITOT  --   --   --  0.4  PROT  --   --   --  7.3  ALBUMIN  --   --   --  2.8*  INR 1.08 1.36 1.14 1.17     INFECTIOUS  Recent Labs Lab 06/14/14 1200 06/14/14 1532 06/16/14 0518 06/17/14 0329  LATICACIDVEN  --  1.6  --   --  PROCALCITON 0.41  --  0.36 0.25     ENDOCRINE CBG (last 3)   Recent Labs  06/14/14 0941  GLUCAP 193*     IMAGING x48h  - image(s) personally visualized  -   highlighted in bold - in my view and opinion CXR is worse with right atlectasis Dg Chest Port 1 View  06/16/2014   CLINICAL DATA:  Pneumonia.  EXAM: PORTABLE CHEST - 1 VIEW  COMPARISON:  Chest radiograph 06/15/2014  FINDINGS: Monitoring leads overlie the patient. Low lung volumes. Stable cardiac and mediastinal contours. Minimal heterogeneous opacities left greater than right lung base. Improved aeration of the lungs bilaterally, particularly the right lower lung. Old right clavicle fracture.  IMPRESSION: Improved aeration of the lungs bilaterally with persistent left-greater-than-right lower lung heterogeneous opacities which may represent residual atelectasis or infection.   Electronically Signed   By: Lovey Newcomer M.D.   On: 06/16/2014  11:48   CXR 5/21/ portable -image reviewed  ASSESSMENT / PLAN:  #[place]  - known lupus anticoagulant with hx of PE - on life long coumadin - leg vein dopplers Neg 5/21  #Current  - Acute Respiratory Failure with Hypoxia and Rt sided HHCAP RTside  -ATX? ALI? bilat infiltrates  Blood cx neg as of 5/22 -r/o edema  PLAN -  - Will update CXR -IS -upright as able -no WOB , no need NIMV daytime at this stage -consider neg balance goals -clinical presentation, infiltrates, fever, NOT c/w PE  - ok transfer to med surg floor anticipating discharge early in week  -pcxr today -to goal sat 94% -PCT unimpressive -if O2 needs increase - lasix and echo  # Hypertension  PLAN -  atenolol Monitor BP   #Sepsis due to Pneumonia Possible  - IV antibiotic to continue - pct unimpressive -remains culture neg Follow cultures in am and clinical status, may be able to narrow or dc abx  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

## 2014-06-17 NOTE — Progress Notes (Addendum)
ANTICOAGULATION + ANTIBIOTIC CONSULT NOTE - Follow Up  Pharmacy Consult for Heparin + warfarin AND Vanc + Zosyn Indication: Hx DVT and PE with positive Lupus Anticoagulant AND r/o pneumonia  Allergies  Allergen Reactions  . Fentanyl Other (See Comments)    Patient developed severe chest wall rigidity following IV dose of Fentanyl for post op pain management. Reaction occurred on first and second administration of Fentanyl intraop producing significant drop in SaO2 and increased airway pressures.  . Morphine Swelling    Patient Measurements: Height: 5\' 9"  (175.3 cm) Weight: 198 lb 10.2 oz (90.1 kg) IBW/kg (Calculated) : 70.7  Vital Signs: Temp: 99.3 F (37.4 C) (05/22 0700) Temp Source: Oral (05/22 0700) BP: 111/64 mmHg (05/22 0802) Pulse Rate: 97 (05/22 0802)  Labs:  Recent Labs  06/14/14 1200  06/15/14 0218  06/16/14 0518 06/17/14 0329 06/17/14 1100  HGB 12.7*  --  11.7*  --  11.4* 12.0*  --   HCT 38.6*  --  36.1*  --  34.5* 35.4*  --   PLT 375  --  345  --  357 402*  --   LABPROT  --   --  16.9*  --  14.8 15.0  --   INR  --   --  1.36  --  1.14 1.17  --   HEPARINUNFRC  --   < > 0.15*  < > 0.31 0.18* 0.46  CREATININE 1.10  --   --   --  0.83 1.02  --   < > = values in this interval not displayed.  Estimated Creatinine Clearance: 120.8 mL/min (by C-G formula based on Cr of 1.02).  Assessment: 29yo male who was on warfarin PTA, but it was held for planned spinal surgery (last dose PTA 5/11). On POD#2, he had chest pain and IV heparin was started for possible PE, along with antibiotics for possible PNA. Continues on IV heparin as bridge to therapeutic warfarin.  Heparin rate increased this morning d/t subtherapeutic level >> now therapeutic at 0.46 units/mL- neurosurgery prefers patient on lower end of goal. INR remains subtherapeutic - now 1.17 CBC stable, no bleeding noted.  Pt also continues on empiric Vanc + Zosyn for r/o pneumonia Pt remains afebrile, WBC  remains elevated at 13.9  5/20 BCx2 >> ngtd (drawn after abx started)  Vanc 5/19 >>  Zosyn 5/19 >>  Goal of Therapy:  INR 2-3 Heparin level 0.3-0.5 units/ml Monitor platelets by anticoagulation protocol: Yes  Vancomycin trough level 15-20 mcg/mL  Plan: -Continue heparin at same rate 2200 units/hr -Check 6hr level to confirm -Warfarin 7.5mg  x1 -Continue Vanc 1g IV Q8h and Zosyn 3.375g IV Q8h (4hr infusion) -Monitor daily HL, INR, CBC, s/sx bleeding -Possible narrow D/C abx 5/23 -Check vanc trough if pt to continue w/ broad spectrum abx  Drucie Opitz, PharmD Clinical Pharmacy Resident Pager: [phone] 06/17/2014 11:58 AM   Addendum:  Confirmatory heparin level = 0.38, therapeutic.  Continue current rate  Maryanna Shape, PharmD, BCPS  Clinical Pharmacist  Pager: [phone]

## 2014-06-18 LAB — PHOSPHORUS: PHOSPHORUS: 4.3 mg/dL (ref 2.5–4.6)

## 2014-06-18 LAB — PROCALCITONIN: Procalcitonin: 0.13 ng/mL

## 2014-06-18 LAB — HEPARIN LEVEL (UNFRACTIONATED): HEPARIN UNFRACTIONATED: 0.56 [IU]/mL (ref 0.30–0.70)

## 2014-06-18 LAB — BASIC METABOLIC PANEL
ANION GAP: 12 (ref 5–15)
BUN: 12 mg/dL (ref 6–20)
CALCIUM: 9 mg/dL (ref 8.9–10.3)
CO2: 16 mmol/L — ABNORMAL LOW (ref 22–32)
Chloride: 110 mmol/L (ref 101–111)
Creatinine, Ser: 0.8 mg/dL (ref 0.61–1.24)
GFR calc non Af Amer: >60 mL/min (ref >60)
Glucose, Bld: 116 mg/dL — ABNORMAL HIGH (ref 65–99)
Potassium: 3.8 mmol/L (ref 3.5–5.1)
Sodium: 138 mmol/L (ref 135–145)

## 2014-06-18 LAB — PROTIME-INR
INR: 1.16 (ref 0.00–1.49)
Prothrombin Time: 15 seconds (ref 11.6–15.2)

## 2014-06-18 LAB — CBC
HCT: 35.1 % — ABNORMAL LOW (ref 39.0–52.0)
Hemoglobin: 11.3 g/dL — ABNORMAL LOW (ref 13.0–17.0)
MCH: 29.3 pg (ref 26.0–34.0)
MCHC: 32.2 g/dL (ref 30.0–36.0)
MCV: 90.9 fL (ref 78.0–100.0)
Platelets: 407 10*3/uL — ABNORMAL HIGH (ref 150–400)
RBC: 3.86 MIL/uL — AB (ref 4.22–5.81)
RDW: 12.4 % (ref 11.5–15.5)
WBC: 12.6 10*3/uL — ABNORMAL HIGH (ref 4.0–10.5)

## 2014-06-18 LAB — MAGNESIUM: Magnesium: 2.2 mg/dL (ref 1.7–2.4)

## 2014-06-18 MED ORDER — AMOXICILLIN-POT CLAVULANATE 875-125 MG PO TABS
1.0000 | ORAL_TABLET | Freq: Two times a day (BID) | ORAL | Status: DC
  Administered 2014-06-18 – 2014-06-19 (×3): 1 via ORAL
  Filled 2014-06-18 (×4): qty 1

## 2014-06-18 NOTE — Progress Notes (Signed)
ANTICOAGULATION CONSULT NOTE - Follow Up Consult  Pharmacy Consult for heparin, coumadin Indication: PE/DVT   Labs:  Recent Labs  06/16/14 0518 06/17/14 0329 06/17/14 1100 06/17/14 1720 06/18/14 0513  HGB 11.4* 12.0*  --   --  11.3*  HCT 34.5* 35.4*  --   --  35.1*  PLT 357 402*  --   --  407*  LABPROT 14.8 15.0  --   --  15.0  INR 1.14 1.17  --   --  1.16  HEPARINUNFRC 0.31 0.18* 0.46 0.38 0.56  CREATININE 0.83 1.02  --   --  0.80     Assessment: 28yo male on heparin for PE with heparin level slightly above goal. INR today is 1.16.  No bleeding noted  Goal of Therapy:  Heparin level 0.3-0.5 units/ml  INR 2-3   Plan:  Will decrease heparin gtt to 2150 units/hr Coumadin 7.5 mg po today Continue daily HL, PT/INR.  Thanks for allowing pharmacy to be a part of this patient's care.  Excell Seltzer, PharmD Clinical Pharmacist, [phone] 06/18/2014,7:30 AM

## 2014-06-18 NOTE — Progress Notes (Signed)
Medicare Important Message given? YES (If response is "NO", the following Medicare IM given date fields will be blank) Date Medicare IM given: 06/18/14  Medicare IM given by: Layali Freund 

## 2014-06-18 NOTE — Progress Notes (Signed)
Name: Andre Cervantes MRN: [id number] DOB: [birth year]    ADMISSION DATE:  06/12/2014 CONSULTATION DATE:  06/14/2014  REFERRING MD :  Hal Neer   CHIEF COMPLAINT:  dyspnea  BRIEF PATIENT DESCRIPTION: 29 yo male presented for L5-S1 fusion.  Developed fever, dyspnea post-op from HCAP.  Hx of PE with positive lupus anticoagulant on chronic coumadin therapy.  SIGNIFICANT EVENTS  5/21 Doppler legs >> no DVT  SUBJECTIVE:   VITAL SIGNS: Temp:  [98 F (36.7 C)-98.6 F (37 C)] 98.6 F (37 C) (05/23 0745) Pulse Rate:  [84-96] 96 (05/23 0745) Resp:  [15-17] 15 (05/23 0357) BP: (112-135)/(70-81) 113/71 mmHg (05/23 0745) SpO2:  [96 %-100 %] 100 % (05/23 0745)  PHYSICAL EXAMINATION: General:  Young wdwn male, no distress, does breath through mouth Neuro:  Alert, oriented HEENT:  Mm moist, no JVD  Cardiovascular:  s1s2 rrr  Lungs:  CTa, reduced bases, nasal O2 Abdomen:  Soft, +bs, non tender  Musculoskeletal:  Warm and dry, no edema, lower back surgical incision with No drain, dry   CMP Latest Ref Rng 06/18/2014 06/17/2014 06/16/2014  Glucose 65 - 99 mg/dL 116(H) 98 141(H)  BUN 6 - 20 mg/dL 12 13 9   Creatinine 0.61 - 1.24 mg/dL 0.80 1.02 0.83  Sodium 135 - 145 mmol/L 138 139 136  Potassium 3.5 - 5.1 mmol/L 3.8 3.7 3.5  Chloride 101 - 111 mmol/L 110 107 105  CO2 22 - 32 mmol/L 16(L) 21(L) 23  Calcium 8.9 - 10.3 mg/dL 9.0 9.2 8.9  Total Protein 6.5 - 8.1 g/dL - 7.3 -  Total Bilirubin 0.3 - 1.2 mg/dL - 0.4 -  Alkaline Phos 38 - 126 U/L - 46 -  AST 15 - 41 U/L - 56(H) -  ALT 17 - 63 U/L - 106(H) -    CBC Latest Ref Rng 06/18/2014 06/17/2014 06/16/2014  WBC 4.0 - 10.5 K/uL 12.6(H) 13.9(H) 12.5(H)  Hemoglobin 13.0 - 17.0 g/dL 11.3(L) 12.0(L) 11.4(L)  Hematocrit 39.0 - 52.0 % 35.1(L) 35.4(L) 34.5(L)  Platelets 150 - 400 K/uL 407(H) 402(H) 357    Lab Results  Component Value Date   INR 1.16 06/18/2014   INR 1.17 06/17/2014   INR 1.14 06/16/2014    Dg Chest Port 1  View  06/17/2014   CLINICAL DATA:  Pneumonia.  EXAM: PORTABLE CHEST - 1 VIEW  COMPARISON:  Yesterday.  FINDINGS: Normal sized heart. Decreased left lower lobe airspace opacity. Resolved right basilar opacity. The remainder of the lungs are clear. Unremarkable bones.  IMPRESSION: Decreased left lower lobe atelectasis or patchy pneumonia. Resolved right basilar atelectasis.   Electronically Signed   By: Claudie Revering M.D.   On: 06/17/2014 13:19   Dg Chest Port 1 View  06/16/2014   CLINICAL DATA:  Pneumonia.  EXAM: PORTABLE CHEST - 1 VIEW  COMPARISON:  Chest radiograph 06/15/2014  FINDINGS: Monitoring leads overlie the patient. Low lung volumes. Stable cardiac and mediastinal contours. Minimal heterogeneous opacities left greater than right lung base. Improved aeration of the lungs bilaterally, particularly the right lower lung. Old right clavicle fracture.  IMPRESSION: Improved aeration of the lungs bilaterally with persistent left-greater-than-right lower lung heterogeneous opacities which may represent residual atelectasis or infection.   Electronically Signed   By: Lovey Newcomer M.D.   On: 06/16/2014 11:48    ASSESSMENT / PLAN:  Acute hypoxic respiratory failure 2nd to HCAP. Plan: - day 5 of Abx >> change to po augmentin - f/u CXR 5/24  Hx of PE with  positive lupus anticoagulant. Plan: - pharmacy to dose coumadin and then d/c heparin gtt once INR above 2  Hx of HTN. Plan: - tenormin  S/p L5-S1 fusion. Plan: - post-op care per neurosurgery - elavil, neurontin, risperdal  Urine retention. Plan: - flomax  Hx of GERD. Plan: - protonix   Chesley Mires, MD [place] 06/18/2014, 10:53 AM Pager:  [phone] After 3pm call: [phone]

## 2014-06-18 NOTE — Progress Notes (Signed)
Patient ID: Andre Cervantes, male   DOB: 1985-04-18, 29 y.o.   MRN: [id number] Afeb, vss No new neuro issues Feeling progressively better. I am following the lead of CCM regarding his d/c, and he is ready anytime from the neurosurgical stand point.  His dressing is clean and dry with no drainage. Hopefully home soon.

## 2014-06-19 ENCOUNTER — Inpatient Hospital Stay (HOSPITAL_COMMUNITY): Payer: Medicare Other

## 2014-06-19 LAB — BASIC METABOLIC PANEL
Anion gap: 7 (ref 5–15)
BUN: 12 mg/dL (ref 6–20)
CO2: 21 mmol/L — AB (ref 22–32)
Calcium: 9.8 mg/dL (ref 8.9–10.3)
Chloride: 110 mmol/L (ref 101–111)
Creatinine, Ser: 0.87 mg/dL (ref 0.61–1.24)
GFR calc non Af Amer: >60 mL/min (ref >60)
GLUCOSE: 104 mg/dL — AB (ref 65–99)
Potassium: 3.8 mmol/L (ref 3.5–5.1)
Sodium: 138 mmol/L (ref 135–145)

## 2014-06-19 LAB — CBC
HCT: 38.6 % — ABNORMAL LOW (ref 39.0–52.0)
Hemoglobin: 12.7 g/dL — ABNORMAL LOW (ref 13.0–17.0)
MCH: 30.1 pg (ref 26.0–34.0)
MCHC: 32.9 g/dL (ref 30.0–36.0)
MCV: 91.5 fL (ref 78.0–100.0)
Platelets: 537 10*3/uL — ABNORMAL HIGH (ref 150–400)
RBC: 4.22 MIL/uL (ref 4.22–5.81)
RDW: 12.5 % (ref 11.5–15.5)
WBC: 16.7 10*3/uL — ABNORMAL HIGH (ref 4.0–10.5)

## 2014-06-19 LAB — HEPARIN LEVEL (UNFRACTIONATED): HEPARIN UNFRACTIONATED: 0.56 [IU]/mL (ref 0.30–0.70)

## 2014-06-19 LAB — PROTIME-INR
INR: 1.17 (ref 0.00–1.49)
Prothrombin Time: 15 seconds (ref 11.6–15.2)

## 2014-06-19 MED ORDER — SODIUM CHLORIDE 0.9 % IV SOLN: INTRAVENOUS | Status: DC | PRN

## 2014-06-19 MED ORDER — WARFARIN SODIUM 7.5 MG PO TABS
7.5000 mg | ORAL_TABLET | Freq: Once | ORAL | Status: DC
  Filled 2014-06-19: qty 1

## 2014-06-19 MED ORDER — ENOXAPARIN SODIUM 100 MG/ML ~~LOC~~ SOLN
90.0000 mg | Freq: Two times a day (BID) | SUBCUTANEOUS | Status: DC
  Administered 2014-06-19: 90 mg via SUBCUTANEOUS
  Filled 2014-06-19 (×2): qty 1

## 2014-06-19 MED ORDER — OXYCODONE HCL 5 MG PO TABS: 5.0000 mg | ORAL_TABLET | ORAL | Status: DC | PRN

## 2014-06-19 MED ORDER — ENOXAPARIN SODIUM 30 MG/0.3ML ~~LOC~~ SOLN: 90.0000 mg | Freq: Two times a day (BID) | SUBCUTANEOUS | Status: DC

## 2014-06-19 NOTE — Discharge Summary (Signed)
Physician Discharge Summary  Patient ID: Andre Cervantes MRN: [id number] DOB/AGE: 1985/12/17 29 y.o.  Admit date: 06/12/2014 Discharge date: 06/19/2014  Admission Diagnoses:  Discharge Diagnoses:  Active Problems:   Spondylolysis of lumbar region   Fusion of lumbar spine   HCAP (healthcare-associated pneumonia)   PE (pulmonary embolism)   Pneumonia   Discharged Condition: good  Hospital Course: Surgery 1 week ago for listhesis at L5S1 . Had plif and did well. Developed sob day 2 post op and ccm consulted and felt he has HCAP. Was treated with antibiotic and improved rapidly. After 1 week, he is now afebrile, ambulating well with no shortness of breath. CCM feels he can be discharged and now sent home to recover further. He has no leg pain at this time and his wound is healing well.  Consults: pulmonary/intensive care  Significant Diagnostic Studies: none  Treatments: surgery: L5S1 plif  Discharge Exam: Blood pressure 108/66, pulse 85, temperature 98.5 F (36.9 C), temperature source Oral, resp. rate 21, height 5\' 9"  (1.753 m), weight 90.1 kg (198 lb 10.2 oz), SpO2 97 %. Incision/Wound:clean and dry; no new neuro issues; no shortness of breath  Disposition: 01-Home or Self Care     Medication List    ASK your doctor about these medications        acetaminophen 500 MG tablet  Commonly known as:  TYLENOL  Take 1,000 mg by mouth every 8 (eight) hours as needed for moderate pain.     amitriptyline 25 MG tablet  Commonly known as:  ELAVIL  Take 50 mg by mouth at bedtime.     atenolol 50 MG tablet  Commonly known as:  TENORMIN  Take 50 mg by mouth daily.     enoxaparin 30 MG/0.3ML injection  Commonly known as:  LOVENOX  Inject 0.3 mLs (30 mg total) into the skin every 12 (twelve) hours.     fenofibrate micronized 134 MG capsule  Commonly known as:  LOFIBRA  Take 134 mg by mouth at bedtime.     Gabapentin Enacarbil ER 300 MG Tbcr  Take 300 mg by mouth 2 (two)  times daily.     Gabapentin Enacarbil ER 300 MG Tbcr  Take 1 capsule by mouth 2 (two) times daily as needed (Takes 1 daily, can take another if needed).     loratadine 10 MG tablet  Commonly known as:  CLARITIN  Take 10 mg by mouth daily.     lovastatin 40 MG tablet  Commonly known as:  MEVACOR  Take 40 mg by mouth at bedtime.     methocarbamol 500 MG tablet  Commonly known as:  ROBAXIN  Take 1 tablet (500 mg total) by mouth every 6 (six) hours as needed for muscle spasms.     montelukast 10 MG tablet  Commonly known as:  SINGULAIR  Take 10 mg by mouth at bedtime.     omeprazole 20 MG capsule  Commonly known as:  PRILOSEC  Take 20 mg by mouth daily.     oxyCODONE-acetaminophen 5-325 MG per tablet  Commonly known as:  PERCOCET/ROXICET  Take 1-2 tablets by mouth every 4 (four) hours as needed for moderate pain.     risperiDONE 1 MG tablet  Commonly known as:  RISPERDAL  Take 3 mg by mouth at bedtime.     tamsulosin 0.4 MG Caps capsule  Commonly known as:  FLOMAX  Take 0.4 mg by mouth daily.     warfarin 4 MG tablet  Commonly known  as:  COUMADIN  Take 4 mg by mouth every other day. Currently alternates with 6 mg     warfarin 6 MG tablet  Commonly known as:  COUMADIN  Take 6 mg by mouth every other day. Currently alternates with 4 mg         At home rest most of the time. Get up 9 or 10 times each day and take a 15 or 20 minute walk. No riding in the car and to your first postoperative appointment. If you have neck surgery you may shower from the chest down starting on the third postoperative day. If you had back surgery he may start showering on the third postoperative day with saran wrap wrapped around your incisional area 3 times. After the shower remove the saran wrap. Take pain medicine as needed and other medications as instructed. Call my office for an appointment.  SignedFaythe Ghee, MD 06/19/2014, 1:20 PM

## 2014-06-19 NOTE — Progress Notes (Signed)
PCCM Interval Progress Note  Called by Warren Lacy MD due to pt missing prescription for Lovenox prior to discharge.  Pt's on neurosurgery service under Dr. Hal Neer.  Dr. Hal Neer currently in meeting so requested PCCM to write prescription for pt's Lovenox.   Pt has hx of PE with positive lupus anticoagulant.  He is to start warfarin today 06/19/14.  He has been on a heparin gtt bridge but this will be stopped on d/c and substituted with Lovenox at 90mg  BID (1mg /kg).  Per pt's RN, Dr. Hal Neer has instructed pt to call his PCP (Dr. Tomma Lightning at [facility] in [place]) and set up an appointment ASAP within next day or two for INR check / warfarin dosing.  Prescription for Lovenox 90mg  BID has been added to pt's discharge meds.  Prescription written for a total of 14 doses and pt will follow up with PCP for direction on when he can stop this lovenox bridge and continue with warfarin only.   Montey Hora, [place] [facility] Pager: [phone]  or [phone] 06/19/2014, 3:46 PM

## 2014-06-19 NOTE — Progress Notes (Signed)
Name: Andre Cervantes MRN: [id number] DOB: [birth year]    ADMISSION DATE:  06/12/2014 CONSULTATION DATE:  06/14/2014  REFERRING MD :  Hal Neer   CHIEF COMPLAINT:  dyspnea  BRIEF PATIENT DESCRIPTION: 29 yo male presented for L5-S1 fusion.  Developed fever, dyspnea post-op from HCAP.  Hx of PE with positive lupus anticoagulant on chronic coumadin therapy.  SIGNIFICANT EVENTS  5/21 Doppler legs >> no DVT  SUBJECTIVE:  Feels better.  Denies chest pain, cough.  Wants to go home.  VITAL SIGNS: Temp:  [97.2 F (36.2 C)-98.4 F (36.9 C)] 97.2 F (36.2 C) (05/24 0700) Pulse Rate:  [86-121] 92 (05/24 0700) Resp:  [17-26] 21 (05/24 0700) BP: (115-138)/(63-97) 121/74 mmHg (05/24 0740) SpO2:  [97 %-100 %] 98 % (05/24 0700)  PHYSICAL EXAMINATION: General: no distress Neuro: follows commands HEENT: no sinus tenderness Cardiovascular: regular Lungs: no wheeze Abdomen:  Soft, non tender Musculoskeletal: no edema   CMP Latest Ref Rng 06/19/2014 06/18/2014 06/17/2014  Glucose 65 - 99 mg/dL 104(H) 116(H) 98  BUN 6 - 20 mg/dL 12 12 13   Creatinine 0.61 - 1.24 mg/dL 0.87 0.80 1.02  Sodium 135 - 145 mmol/L 138 138 139  Potassium 3.5 - 5.1 mmol/L 3.8 3.8 3.7  Chloride 101 - 111 mmol/L 110 110 107  CO2 22 - 32 mmol/L 21(L) 16(L) 21(L)  Calcium 8.9 - 10.3 mg/dL 9.8 9.0 9.2  Total Protein 6.5 - 8.1 g/dL - - 7.3  Total Bilirubin 0.3 - 1.2 mg/dL - - 0.4  Alkaline Phos 38 - 126 U/L - - 46  AST 15 - 41 U/L - - 56(H)  ALT 17 - 63 U/L - - 106(H)    CBC Latest Ref Rng 06/19/2014 06/18/2014 06/17/2014  WBC 4.0 - 10.5 K/uL 16.7(H) 12.6(H) 13.9(H)  Hemoglobin 13.0 - 17.0 g/dL 12.7(L) 11.3(L) 12.0(L)  Hematocrit 39.0 - 52.0 % 38.6(L) 35.1(L) 35.4(L)  Platelets 150 - 400 K/uL 537(H) 407(H) 402(H)    Lab Results  Component Value Date   INR 1.17 06/19/2014   INR 1.16 06/18/2014   INR 1.17 06/17/2014    Dg Chest 2 View  06/19/2014   CLINICAL DATA:  Shortness of breath.  EXAM: CHEST  2 VIEW   COMPARISON:  06/17/2014.  FINDINGS: Mediastinum hilar structures are normal. Stable left base subsegmental atelectasis and or infiltrate. No pleural effusion or pneumothorax. Heart size is normal. Pulmonary vascularity normal. No acute bony abnormality .  IMPRESSION: Stable left base mild subsegmental atelectasis and or infiltrate .   Electronically Signed   By: [place]   On: 06/19/2014 07:54   Dg Chest Port 1 View  06/17/2014   CLINICAL DATA:  Pneumonia.  EXAM: PORTABLE CHEST - 1 VIEW  COMPARISON:  Yesterday.  FINDINGS: Normal sized heart. Decreased left lower lobe airspace opacity. Resolved right basilar opacity. The remainder of the lungs are clear. Unremarkable bones.  IMPRESSION: Decreased left lower lobe atelectasis or patchy pneumonia. Resolved right basilar atelectasis.   Electronically Signed   By: Claudie Revering M.D.   On: 06/17/2014 13:19    ASSESSMENT / PLAN:  Acute hypoxic respiratory failure 2nd to HCAP. Plan: - day 6 of Abx >> currently on po augmentin >> d/c after dose on 5/24  Hx of PE with positive lupus anticoagulant. Plan: - pharmacy to dose coumadin >> goal INR 2 to 3 - can d/c heparin gtt and use lovenox temporarily  Hx of HTN. Plan: - tenormin  S/p L5-S1 fusion. Plan: - post-op care  per neurosurgery - elavil, neurontin, risperdal  Urine retention. Plan: - flomax  Hx of GERD. Plan: - protonix  Summary: He is stable for d/c home from pulmonary standpoint.  He can f/u with his PCP as outpt.  PCCM will sign off.  Please call if additional help is needed.   Chesley Mires, MD [facility]/Critical Care 06/19/2014, 10:45 AM Pager:  [phone] After 3pm call: [phone]

## 2014-06-19 NOTE — Progress Notes (Signed)
Provided patient and parents with discharge instructions. Spoke at length about Lovenox injection. Patient and parents verbalized understanding. Copy of script for pain medicine provided, Lovenox was electronically sent to outpatient pharmacy.  Forms signed and copy provided. Patient discharged via wheelchair by NT to be discharged to patients and home by private vehicle. PIV removed. No signs of infection at PIV sites nor at surgical site.

## 2014-06-19 NOTE — Care Management Note (Signed)
Case Management Note  Patient Details  Name: Andre Cervantes MRN: [id number] Date of Birth: [birth year]  Subjective/Objective:                    Action/Plan:   Expected Discharge Date:   (pending)               Expected Discharge Plan:  [place]  In-House Referral:     Discharge planning Services  CM Consult  Post Acute Care Choice:    Choice offered to:     DME Arranged:    DME Agency:     HH Arranged:    HH Agency:     Status of Service:  In process, will continue to follow  Medicare Important Message Given:    Date Medicare IM Given:    Medicare IM give by:    Date Additional Medicare IM Given:    Additional Medicare Important Message give by:     If discussed at [place] of Stay Meetings, dates discussed:  06/19/14  Additional Comments:  Sharin Mons, RN 06/19/2014, 10:58 AM

## 2014-06-19 NOTE — Progress Notes (Signed)
ANTICOAGULATION CONSULT NOTE - Follow Up Consult  Pharmacy Consult for heparin>>lovenox, coumadin Indication: PE/DVT   Labs:  Recent Labs  06/17/14 0329  06/17/14 1720 06/18/14 0513 06/19/14 0238  HGB 12.0*  --   --  11.3* 12.7*  HCT 35.4*  --   --  35.1* 38.6*  PLT 402*  --   --  407* 537*  LABPROT 15.0  --   --  15.0 15.0  INR 1.17  --   --  1.16 1.17  HEPARINUNFRC 0.18*  < > 0.38 0.56 0.56  CREATININE 1.02  --   --  0.80 0.87  < > = values in this interval not displayed.   Assessment: 29yo male to cont coumadin for h/o PE.  INR today is 1.17.  No dose of coumadin was ordered yesterday.  Plan to change to lovenox today.  Goal of Therapy:  INR 2-3   Plan:  Lovenox 90 mg sq q12 hours Coumadin 7.5 mg po today Continue daily PT/INR.  Thanks for allowing pharmacy to be a part of this patient's care.  Excell Seltzer, PharmD Clinical Pharmacist, [phone] 06/19/2014,11:16 AM

## 2014-06-21 LAB — CULTURE, BLOOD (ROUTINE X 2)
CULTURE: NO GROWTH
Culture: NO GROWTH

## 2014-06-26 ENCOUNTER — Encounter (HOSPITAL_COMMUNITY): Payer: Self-pay | Admitting: Physical Medicine and Rehabilitation

## 2014-06-26 ENCOUNTER — Emergency Department (HOSPITAL_COMMUNITY)
Admission: EM | Admit: 2014-06-26 | Discharge: 2014-06-26 | Disposition: A | Discharge status: Home / Self Care | Payer: Medicare Other | Attending: Emergency Medicine | Admitting: Emergency Medicine

## 2014-06-26 ENCOUNTER — Emergency Department (HOSPITAL_COMMUNITY): Payer: Medicare Other

## 2014-06-26 DIAGNOSIS — Z79899 Other long term (current) drug therapy: Secondary | ICD-10-CM | POA: Insufficient documentation

## 2014-06-26 DIAGNOSIS — Z8739 Personal history of other diseases of the musculoskeletal system and connective tissue: Secondary | ICD-10-CM | POA: Insufficient documentation

## 2014-06-26 DIAGNOSIS — Z86718 Personal history of other venous thrombosis and embolism: Secondary | ICD-10-CM | POA: Diagnosis not present

## 2014-06-26 DIAGNOSIS — G44209 Tension-type headache, unspecified, not intractable: Secondary | ICD-10-CM | POA: Diagnosis not present

## 2014-06-26 DIAGNOSIS — R630 Anorexia: Secondary | ICD-10-CM | POA: Diagnosis not present

## 2014-06-26 DIAGNOSIS — R51 Headache: Secondary | ICD-10-CM | POA: Diagnosis present

## 2014-06-26 DIAGNOSIS — Z86711 Personal history of pulmonary embolism: Secondary | ICD-10-CM | POA: Insufficient documentation

## 2014-06-26 DIAGNOSIS — F909 Attention-deficit hyperactivity disorder, unspecified type: Secondary | ICD-10-CM | POA: Diagnosis not present

## 2014-06-26 DIAGNOSIS — Z7901 Long term (current) use of anticoagulants: Secondary | ICD-10-CM | POA: Diagnosis not present

## 2014-06-26 DIAGNOSIS — I10 Essential (primary) hypertension: Secondary | ICD-10-CM | POA: Insufficient documentation

## 2014-06-26 LAB — CBC WITH DIFFERENTIAL/PLATELET
Basophils Absolute: 0.1 10*3/uL (ref 0.0–0.1)
Basophils Relative: 0 % (ref 0–1)
Eosinophils Absolute: 0 10*3/uL (ref 0.0–0.7)
Eosinophils Relative: 0 % (ref 0–5)
HCT: 40.9 % (ref 39.0–52.0)
Hemoglobin: 13.6 g/dL (ref 13.0–17.0)
Lymphocytes Relative: 19 % (ref 12–46)
Lymphs Abs: 2.5 10*3/uL (ref 0.7–4.0)
MCH: 30.5 pg (ref 26.0–34.0)
MCHC: 33.3 g/dL (ref 30.0–36.0)
MCV: 91.7 fL (ref 78.0–100.0)
Monocytes Absolute: 0.4 10*3/uL (ref 0.1–1.0)
Monocytes Relative: 3 % (ref 3–12)
NEUTROS PCT: 78 % — AB (ref 43–77)
Neutro Abs: 10.5 10*3/uL — ABNORMAL HIGH (ref 1.7–7.7)
Platelets: 650 10*3/uL — ABNORMAL HIGH (ref 150–400)
RBC: 4.46 MIL/uL (ref 4.22–5.81)
RDW: 12.3 % (ref 11.5–15.5)
WBC: 13.4 10*3/uL — ABNORMAL HIGH (ref 4.0–10.5)

## 2014-06-26 LAB — BASIC METABOLIC PANEL
Anion gap: 10 (ref 5–15)
BUN: 17 mg/dL (ref 6–20)
CO2: 22 mmol/L (ref 22–32)
Calcium: 9.9 mg/dL (ref 8.9–10.3)
Chloride: 106 mmol/L (ref 101–111)
Creatinine, Ser: 1.15 mg/dL (ref 0.61–1.24)
Glucose, Bld: 83 mg/dL (ref 65–99)
POTASSIUM: 4.4 mmol/L (ref 3.5–5.1)
SODIUM: 138 mmol/L (ref 135–145)

## 2014-06-26 LAB — PROTIME-INR
INR: 1.32 (ref 0.00–1.49)
PROTHROMBIN TIME: 16.5 s — AB (ref 11.6–15.2)

## 2014-06-26 MED ORDER — NAPROXEN 500 MG PO TABS: 500.0000 mg | ORAL_TABLET | Freq: Two times a day (BID) | ORAL | Status: DC

## 2014-06-26 MED ORDER — KETOROLAC TROMETHAMINE 30 MG/ML IJ SOLN
30.0000 mg | Freq: Once | INTRAMUSCULAR | Status: AC
  Administered 2014-06-26: 30 mg via INTRAVENOUS
  Filled 2014-06-26: qty 1

## 2014-06-26 MED ORDER — SODIUM CHLORIDE 0.9 % IV BOLUS (SEPSIS)
1000.0000 mL | Freq: Once | INTRAVENOUS | Status: AC
Start: 2014-06-26 — End: 2014-06-26
  Administered 2014-06-26: 1000 mL via INTRAVENOUS

## 2014-06-26 MED ORDER — METOCLOPRAMIDE HCL 5 MG/ML IJ SOLN
10.0000 mg | Freq: Once | INTRAMUSCULAR | Status: DC
  Filled 2014-06-26: qty 2

## 2014-06-26 MED ORDER — DIPHENHYDRAMINE HCL 50 MG/ML IJ SOLN
25.0000 mg | Freq: Once | INTRAMUSCULAR | Status: DC
  Filled 2014-06-26: qty 1

## 2014-06-26 MED ORDER — ONDANSETRON HCL 4 MG/2ML IJ SOLN
4.0000 mg | Freq: Once | INTRAMUSCULAR | Status: AC
  Administered 2014-06-26: 4 mg via INTRAVENOUS
  Filled 2014-06-26: qty 2

## 2014-06-26 MED ORDER — METOCLOPRAMIDE HCL 5 MG/ML IJ SOLN
10.0000 mg | Freq: Once | INTRAMUSCULAR | Status: AC
  Administered 2014-06-26: 10 mg via INTRAVENOUS
  Filled 2014-06-26: qty 2

## 2014-06-26 MED ORDER — DIPHENHYDRAMINE HCL 50 MG/ML IJ SOLN
25.0000 mg | Freq: Once | INTRAMUSCULAR | Status: AC
  Administered 2014-06-26: 25 mg via INTRAVENOUS
  Filled 2014-06-26: qty 1

## 2014-06-26 MED ORDER — SODIUM CHLORIDE 0.9 % IV BOLUS (SEPSIS): 1000.0000 mL | Freq: Once | INTRAVENOUS | Status: DC

## 2014-06-26 MED ORDER — KETOROLAC TROMETHAMINE 30 MG/ML IJ SOLN
30.0000 mg | Freq: Once | INTRAMUSCULAR | Status: DC
  Filled 2014-06-26: qty 1

## 2014-06-26 NOTE — ED Notes (Signed)
Mother of pt states pt became pale and diaphoretic this morning at the MD office and they decided to bring him to the hospital.

## 2014-06-26 NOTE — ED Provider Notes (Signed)
CSN: [id number]     Arrival date & time 06/26/14  1347 History  This chart was scribed for non-physician practitioner, Alvina Chou, PA-C, working with Tanna Furry, MD, by Ian Bushman, ED Scribe. This patient was seen in room TR01C/TR01C and the patient's care was started at 3:05 PM.  Chief Complaint  Patient presents with  . Headache     (Consider location/radiation/quality/duration/timing/severity/associated sxs/prior Treatment) Patient is a 29 y.o. male presenting with headaches. The history is provided by the patient. No language interpreter was used.  Headache Associated symptoms: nausea     HPI Comments: Andre Cervantes is a 29 y.o. male who presents to the Emergency Department complaining of a gradually worsening headache onset this morning as well as nausea.  Patient has associated symptoms of loss of appetite and per mother, he broke out in sweats yesterday and was told to come to the ER by his PCP.   Patient had a posterior lumbar fusion by Dr. Hal Neer on May 17th, and was on put coumadin due to a blood clot in the past.  Denies difficulty ambulating, bowel/bladder incontinence, abdominal pain.  He has no other complaints today.    Past Medical History  Diagnosis Date  . ADD (attention deficit disorder)   . GERD (gastroesophageal reflux disease)   . Hypertension   . DVT (deep venous thrombosis) 2013  . PE (pulmonary embolism) 13    hx  . Arthritis    Past Surgical History  Procedure Laterality Date  . Knee surgery Bilateral 2013     knee  . Rt hand little finger pinning  childhood  . Knee arthroscopy with lateral release Right 01/05/2013    Procedure: KNEE ARTHROSCOPY WITH DEBRIDEMENT, LATERAL RELEASE, CARTICEL PROCEDURE, OPEN ALLOGRAFT;  Surgeon: Sydnee Cabal, MD;  Location: [facility];  Service: Orthopedics;  Laterality: Right;  . Medial patellofemoral ligament repair Right 01/05/2013    Procedure: MEDIAL PATELLA FEMORAL LIGAMENT  RECONSTRUCTION;  Surgeon: Sydnee Cabal, MD;  Location: [facility];  Service: Orthopedics;  Laterality: Right;  . Fracture surgery      rt hand   History reviewed. No pertinent family history. History  Substance Use Topics  . Smoking status: Never Smoker   . Smokeless tobacco: Current User    Types: Chew     Comment: occ chew  . Alcohol Use: Yes     Comment: occ beer    Review of Systems  Constitutional: Positive for appetite change.  Gastrointestinal: Positive for nausea.  Neurological: Positive for headaches.  All other systems reviewed and are negative.     Allergies  Fentanyl and Morphine  Home Medications   Prior to Admission medications   Medication Sig Start Date End Date Taking? Authorizing Provider  amitriptyline (ELAVIL) 25 MG tablet Take 50 mg by mouth at bedtime.     Historical Provider, MD  atenolol (TENORMIN) 50 MG tablet Take 50 mg by mouth daily.    Historical Provider, MD  enoxaparin (LOVENOX) 30 MG/0.3ML injection Inject 0.9 mLs (90 mg total) into the skin every 12 (twelve) hours. 06/19/14   Rahul P Desai, PA-C  fenofibrate micronized (LOFIBRA) 134 MG capsule Take 134 mg by mouth at bedtime.     Historical Provider, MD  Gabapentin Enacarbil ER 300 MG TBCR Take 1 capsule by mouth 2 (two) times daily as needed (Takes 1 daily, can take another if needed).    Historical Provider, MD  Gabapentin Enacarbil ER 300 MG TBCR Take 300 mg by mouth  2 (two) times daily.    Historical Provider, MD  loratadine (CLARITIN) 10 MG tablet Take 10 mg by mouth daily.    Historical Provider, MD  lovastatin (MEVACOR) 40 MG tablet Take 40 mg by mouth at bedtime.    Historical Provider, MD  methocarbamol (ROBAXIN) 500 MG tablet Take 1 tablet (500 mg total) by mouth every 6 (six) hours as needed for muscle spasms. Patient not taking: Reported on 05/30/2014 01/06/13   Bryson L Stilwell, PA-C  montelukast (SINGULAIR) 10 MG tablet Take 10 mg by mouth at bedtime.     Historical Provider, MD  omeprazole (PRILOSEC) 20 MG capsule Take 20 mg by mouth daily.     Historical Provider, MD  oxyCODONE (ROXICODONE) 5 MG immediate release tablet Take 1-2 tablets (5-10 mg total) by mouth every 4 (four) hours as needed for severe pain. 06/19/14   Karie Chimera, MD  risperiDONE (RISPERDAL) 1 MG tablet Take 3 mg by mouth at bedtime.     Historical Provider, MD  tamsulosin (FLOMAX) 0.4 MG CAPS capsule Take 0.4 mg by mouth daily.     Historical Provider, MD  warfarin (COUMADIN) 4 MG tablet Take 4 mg by mouth every other day. Currently alternates with 6 mg    Historical Provider, MD  warfarin (COUMADIN) 6 MG tablet Take 6 mg by mouth every other day. Currently alternates with 4 mg    Historical Provider, MD   BP 119/89 mmHg  Pulse 81  Temp(Src) 98.2 F (36.8 C) (Oral)  Resp 18  SpO2 99% Physical Exam  Constitutional: He is oriented to person, place, and time. He appears well-developed and well-nourished. No distress.  HENT:  Head: Normocephalic and atraumatic.  Eyes: Conjunctivae and EOM are normal.  Neck: Normal range of motion.  Cardiovascular: Normal rate.   Pulmonary/Chest: Effort normal. No respiratory distress.  Abdominal: Soft. He exhibits no distension. There is no tenderness. There is no rebound.  Musculoskeletal: Normal range of motion.  Neurological: He is alert and oriented to person, place, and time. No cranial nerve deficit. Coordination normal.  Extremity strength and sensation equal and intact bilaterally.   Skin: Skin is warm and dry.  Psychiatric: He has a normal mood and affect. His behavior is normal.  Nursing note and vitals reviewed.   ED Course  Procedures (including critical care time) DIAGNOSTIC STUDIES: Oxygen Saturation is 99% on RA, normal by my interpretation.    COORDINATION OF CARE: 3:13 PM Discussed treatment plan with patient at beside, the patient agrees with the plan and has no further questions at this time.   Labs  Review Labs Reviewed - No data to display  Imaging Review No results found.   EKG Interpretation None      MDM   Final diagnoses:  None   4:04 PM Patient will have labs and CT head. Patient not appropriate for pod F.    I personally performed the services described in this documentation, which was scribed in my presence. The recorded information has been reviewed and is accurate.    Alvina Chou, PA-C 06/26/14 1607  Noemi Chapel, MD 06/26/14 1946

## 2014-06-26 NOTE — Discharge Instructions (Signed)
Take an extra dose of coumadin tonight when you get home.  Headache:  You are having a headache. No specific cause was found today for your headache. It may have been a migraine or other cause of headache. Stress, anxiety, fatigue, and depression are common triggers for headaches. Your headache today does not appear to be life-threatening or require hospitalization, but often the exact cause of headaches is not determined in the emergency department. Therefore, followup with your doctor is very important to find out what may have caused your headache, and whether or not you need any further diagnostic testing or treatment. Sometimes headaches can appear benign but then more serious symptoms can develop which should prompt an immediate reevaluation by your doctor or the emergency department.  Seek immediate medical attention if:   You develop possible problems with medications prescribed.  The medications don't resolve your headache, if it recurs, or if you have multiple episodes of vomiting or can't take fluids by mouth  You have a change from the usual headache.  If you developed a sudden severe headache or confusion, become poorly responsive or faint, developed a fever above 100.4 or problems breathing, have a change in speech, vision, swallowing or understanding, or developed new weakness, numbness, tingling, incoordination or have a seizure.  If you don't have a family doctor to follow up with, see the follow up list below - call this morning for a follow-up appointment in the next 1-2 days.  RESOURCE GUIDE  Dental Problems  Patients with Medicaid: [facility] Phone:  [phone]                                                  Phone:  [phone]  If unable to pay or uninsured, contact:  [facility] or [facility] to become qualified for  the adult dental clinic.  Chronic Pain Problems Contact [facility]  [phone] Patients need to be referred by their primary care doctor.  Insufficient Money for Medicine Contact [employer]:  call "211" or [place] [phone].  No Primary Care Doctor Call [facility]  [phone] Other agencies that provide inexpensive medical care    [place]  [phone]    [facility] Internal Medicine  [place]  [phone]    [facility]  [phone]    [facility]  [place]  [place]  [phone] [place]   [phone] (emergency services [phone])  Substance Abuse [facility]  [phone] [facility] [phone] [facility] [place] [phone] [facility] [phone] Residential & Outpatient Substance Abuse Program  ([phone]  Abuse/Neglect [place] [phone] [place] [phone] (After Hours)  Emergency Portal [phone]  Maternity Homes Room  at the [place] [phone] [place] [phone]  MRSA Hotline #:   (989) 017-1907    [facility] Dept. [address]      [place] Phone:  [phone]                                   Phone:  [phone]                 Phone:  [place] Phone:  [place] [phone] (After Hours)

## 2014-06-26 NOTE — ED Notes (Signed)
Pt presents to department for evaluation of headache x2 days. Also states nausea. 8/10 pain upon arrival to ED. Pt is alert and oriented x4. No neurological deficits noted.

## 2015-01-27 HISTORY — PX: BACK SURGERY: SHX140

## 2017-01-16 ENCOUNTER — Encounter (HOSPITAL_COMMUNITY): Payer: Self-pay | Admitting: Emergency Medicine

## 2017-01-16 ENCOUNTER — Emergency Department (HOSPITAL_COMMUNITY): Payer: Medicare Other

## 2017-01-16 DIAGNOSIS — F909 Attention-deficit hyperactivity disorder, unspecified type: Secondary | ICD-10-CM | POA: Diagnosis present

## 2017-01-16 DIAGNOSIS — F411 Generalized anxiety disorder: Secondary | ICD-10-CM | POA: Diagnosis present

## 2017-01-16 DIAGNOSIS — I10 Essential (primary) hypertension: Secondary | ICD-10-CM | POA: Diagnosis present

## 2017-01-16 DIAGNOSIS — Z7901 Long term (current) use of anticoagulants: Secondary | ICD-10-CM

## 2017-01-16 DIAGNOSIS — Z79899 Other long term (current) drug therapy: Secondary | ICD-10-CM

## 2017-01-16 DIAGNOSIS — F17220 Nicotine dependence, chewing tobacco, uncomplicated: Secondary | ICD-10-CM | POA: Diagnosis present

## 2017-01-16 DIAGNOSIS — E785 Hyperlipidemia, unspecified: Secondary | ICD-10-CM | POA: Diagnosis present

## 2017-01-16 DIAGNOSIS — M79661 Pain in right lower leg: Secondary | ICD-10-CM | POA: Diagnosis present

## 2017-01-16 DIAGNOSIS — I2692 Saddle embolus of pulmonary artery without acute cor pulmonale: Secondary | ICD-10-CM | POA: Diagnosis not present

## 2017-01-16 DIAGNOSIS — K219 Gastro-esophageal reflux disease without esophagitis: Secondary | ICD-10-CM | POA: Diagnosis present

## 2017-01-16 DIAGNOSIS — F988 Other specified behavioral and emotional disorders with onset usually occurring in childhood and adolescence: Secondary | ICD-10-CM | POA: Diagnosis present

## 2017-01-16 DIAGNOSIS — R6250 Unspecified lack of expected normal physiological development in childhood: Secondary | ICD-10-CM | POA: Diagnosis present

## 2017-01-16 DIAGNOSIS — K047 Periapical abscess without sinus: Secondary | ICD-10-CM | POA: Diagnosis present

## 2017-01-16 DIAGNOSIS — M1990 Unspecified osteoarthritis, unspecified site: Secondary | ICD-10-CM | POA: Diagnosis present

## 2017-01-16 DIAGNOSIS — D72829 Elevated white blood cell count, unspecified: Secondary | ICD-10-CM | POA: Diagnosis present

## 2017-01-16 DIAGNOSIS — E876 Hypokalemia: Secondary | ICD-10-CM | POA: Diagnosis present

## 2017-01-16 DIAGNOSIS — Z885 Allergy status to narcotic agent status: Secondary | ICD-10-CM

## 2017-01-16 DIAGNOSIS — D649 Anemia, unspecified: Secondary | ICD-10-CM | POA: Diagnosis present

## 2017-01-16 DIAGNOSIS — R7989 Other specified abnormal findings of blood chemistry: Secondary | ICD-10-CM | POA: Diagnosis present

## 2017-01-16 DIAGNOSIS — Z791 Long term (current) use of non-steroidal anti-inflammatories (NSAID): Secondary | ICD-10-CM

## 2017-01-16 DIAGNOSIS — F329 Major depressive disorder, single episode, unspecified: Secondary | ICD-10-CM | POA: Diagnosis present

## 2017-01-16 LAB — BASIC METABOLIC PANEL
ANION GAP: 11 (ref 5–15)
BUN: 14 mg/dL (ref 6–20)
CHLORIDE: 102 mmol/L (ref 101–111)
CO2: 22 mmol/L (ref 22–32)
Calcium: 9.3 mg/dL (ref 8.9–10.3)
Creatinine, Ser: 1.08 mg/dL (ref 0.61–1.24)
GFR calc non Af Amer: >60 mL/min (ref >60)
Glucose, Bld: 110 mg/dL — ABNORMAL HIGH (ref 65–99)
Potassium: 3.3 mmol/L — ABNORMAL LOW (ref 3.5–5.1)
Sodium: 135 mmol/L (ref 135–145)

## 2017-01-16 LAB — CBC
HCT: 38.2 % — ABNORMAL LOW (ref 39.0–52.0)
Hemoglobin: 12.7 g/dL — ABNORMAL LOW (ref 13.0–17.0)
MCH: 30.1 pg (ref 26.0–34.0)
MCHC: 33.2 g/dL (ref 30.0–36.0)
MCV: 90.5 fL (ref 78.0–100.0)
PLATELETS: 523 10*3/uL — AB (ref 150–400)
RBC: 4.22 MIL/uL (ref 4.22–5.81)
RDW: 12.3 % (ref 11.5–15.5)
WBC: 12.9 10*3/uL — ABNORMAL HIGH (ref 4.0–10.5)

## 2017-01-16 NOTE — ED Triage Notes (Signed)
Reports right knee pain after falling a week ago in the snow.   Also c/o poor appetite with diarrhea since Monday.  Denies having any n/v.  Also c/o sob.  Breathing easy and nonlabored in triage.  Reports feeling sob whenever moving around.  Goes from hot to cold.  Reports productive cough with green phlegm and sneezing.

## 2017-01-17 ENCOUNTER — Encounter (HOSPITAL_COMMUNITY): Payer: Self-pay | Admitting: Internal Medicine

## 2017-01-17 ENCOUNTER — Inpatient Hospital Stay (HOSPITAL_COMMUNITY)
Admission: EM | Admit: 2017-01-17 | Discharge: 2017-01-19 | DRG: 176 | Disposition: A | Discharge status: Home / Self Care | Payer: Medicare Other | Attending: Internal Medicine | Admitting: Internal Medicine

## 2017-01-17 ENCOUNTER — Other Ambulatory Visit: Payer: Self-pay

## 2017-01-17 ENCOUNTER — Emergency Department (HOSPITAL_COMMUNITY): Payer: Medicare Other

## 2017-01-17 ENCOUNTER — Inpatient Hospital Stay (HOSPITAL_COMMUNITY): Payer: Medicare Other

## 2017-01-17 DIAGNOSIS — F32A Depression, unspecified: Secondary | ICD-10-CM | POA: Diagnosis present

## 2017-01-17 DIAGNOSIS — F329 Major depressive disorder, single episode, unspecified: Secondary | ICD-10-CM | POA: Diagnosis present

## 2017-01-17 DIAGNOSIS — D72829 Elevated white blood cell count, unspecified: Secondary | ICD-10-CM | POA: Diagnosis not present

## 2017-01-17 DIAGNOSIS — I10 Essential (primary) hypertension: Secondary | ICD-10-CM | POA: Diagnosis present

## 2017-01-17 DIAGNOSIS — I82401 Acute embolism and thrombosis of unspecified deep veins of right lower extremity: Secondary | ICD-10-CM | POA: Diagnosis not present

## 2017-01-17 DIAGNOSIS — R7989 Other specified abnormal findings of blood chemistry: Secondary | ICD-10-CM | POA: Diagnosis present

## 2017-01-17 DIAGNOSIS — I340 Nonrheumatic mitral (valve) insufficiency: Secondary | ICD-10-CM | POA: Diagnosis not present

## 2017-01-17 DIAGNOSIS — E785 Hyperlipidemia, unspecified: Secondary | ICD-10-CM | POA: Diagnosis present

## 2017-01-17 DIAGNOSIS — I2699 Other pulmonary embolism without acute cor pulmonale: Secondary | ICD-10-CM

## 2017-01-17 DIAGNOSIS — K219 Gastro-esophageal reflux disease without esophagitis: Secondary | ICD-10-CM | POA: Diagnosis present

## 2017-01-17 DIAGNOSIS — I361 Nonrheumatic tricuspid (valve) insufficiency: Secondary | ICD-10-CM

## 2017-01-17 DIAGNOSIS — E876 Hypokalemia: Secondary | ICD-10-CM | POA: Diagnosis present

## 2017-01-17 DIAGNOSIS — K047 Periapical abscess without sinus: Secondary | ICD-10-CM | POA: Diagnosis not present

## 2017-01-17 DIAGNOSIS — Z79899 Other long term (current) drug therapy: Secondary | ICD-10-CM | POA: Diagnosis not present

## 2017-01-17 DIAGNOSIS — M79661 Pain in right lower leg: Secondary | ICD-10-CM | POA: Diagnosis present

## 2017-01-17 DIAGNOSIS — Z791 Long term (current) use of non-steroidal anti-inflammatories (NSAID): Secondary | ICD-10-CM | POA: Diagnosis not present

## 2017-01-17 DIAGNOSIS — Z885 Allergy status to narcotic agent status: Secondary | ICD-10-CM | POA: Diagnosis not present

## 2017-01-17 DIAGNOSIS — F908 Attention-deficit hyperactivity disorder, other type: Secondary | ICD-10-CM | POA: Diagnosis not present

## 2017-01-17 DIAGNOSIS — F411 Generalized anxiety disorder: Secondary | ICD-10-CM | POA: Diagnosis present

## 2017-01-17 DIAGNOSIS — I2692 Saddle embolus of pulmonary artery without acute cor pulmonale: Secondary | ICD-10-CM | POA: Diagnosis present

## 2017-01-17 DIAGNOSIS — Z7901 Long term (current) use of anticoagulants: Secondary | ICD-10-CM | POA: Diagnosis not present

## 2017-01-17 DIAGNOSIS — F909 Attention-deficit hyperactivity disorder, unspecified type: Secondary | ICD-10-CM | POA: Diagnosis present

## 2017-01-17 DIAGNOSIS — R6250 Unspecified lack of expected normal physiological development in childhood: Secondary | ICD-10-CM | POA: Diagnosis present

## 2017-01-17 DIAGNOSIS — M1990 Unspecified osteoarthritis, unspecified site: Secondary | ICD-10-CM | POA: Diagnosis present

## 2017-01-17 DIAGNOSIS — D649 Anemia, unspecified: Secondary | ICD-10-CM | POA: Diagnosis present

## 2017-01-17 DIAGNOSIS — F17220 Nicotine dependence, chewing tobacco, uncomplicated: Secondary | ICD-10-CM | POA: Diagnosis present

## 2017-01-17 DIAGNOSIS — F988 Other specified behavioral and emotional disorders with onset usually occurring in childhood and adolescence: Secondary | ICD-10-CM | POA: Diagnosis present

## 2017-01-17 HISTORY — DX: Periapical abscess without sinus: K04.7

## 2017-01-17 HISTORY — DX: Elevated white blood cell count, unspecified: D72.829

## 2017-01-17 HISTORY — DX: Saddle embolus of pulmonary artery without acute cor pulmonale: I26.92

## 2017-01-17 LAB — D-DIMER, QUANTITATIVE (NOT AT ARMC): D DIMER QUANT: 4.01 ug{FEU}/mL — AB (ref 0.00–0.50)

## 2017-01-17 LAB — URINALYSIS, ROUTINE W REFLEX MICROSCOPIC
BILIRUBIN URINE: NEGATIVE
Glucose, UA: NEGATIVE mg/dL
Hgb urine dipstick: NEGATIVE
KETONES UR: NEGATIVE mg/dL
LEUKOCYTES UA: NEGATIVE
NITRITE: NEGATIVE
PH: 5 (ref 5.0–8.0)
PROTEIN: NEGATIVE mg/dL
Specific Gravity, Urine: >1.046 — ABNORMAL HIGH (ref 1.005–1.030)

## 2017-01-17 LAB — ECHOCARDIOGRAM COMPLETE: WEIGHTICAEL: 2873.6 [oz_av]

## 2017-01-17 LAB — HEPARIN LEVEL (UNFRACTIONATED)
HEPARIN UNFRACTIONATED: 0.19 [IU]/mL — AB (ref 0.30–0.70)
HEPARIN UNFRACTIONATED: 0.43 [IU]/mL (ref 0.30–0.70)

## 2017-01-17 MED ORDER — GABAPENTIN 100 MG PO CAPS: 100.0000 mg | ORAL_CAPSULE | Freq: Three times a day (TID) | ORAL | Status: DC

## 2017-01-17 MED ORDER — RISPERIDONE 1 MG PO TABS
4.0000 mg | ORAL_TABLET | Freq: Every day | ORAL | Status: DC
  Administered 2017-01-17 – 2017-01-18 (×2): 4 mg via ORAL
  Filled 2017-01-17 (×2): qty 4

## 2017-01-17 MED ORDER — GABAPENTIN 100 MG PO CAPS
100.0000 mg | ORAL_CAPSULE | Freq: Every day | ORAL | Status: DC
  Administered 2017-01-17 – 2017-01-19 (×3): 100 mg via ORAL
  Filled 2017-01-17 (×3): qty 1

## 2017-01-17 MED ORDER — FENOFIBRATE 160 MG PO TABS
160.0000 mg | ORAL_TABLET | Freq: Every day | ORAL | Status: DC
  Administered 2017-01-17 – 2017-01-19 (×3): 160 mg via ORAL
  Filled 2017-01-17 (×3): qty 1

## 2017-01-17 MED ORDER — AMOXICILLIN 500 MG PO CAPS
500.0000 mg | ORAL_CAPSULE | Freq: Three times a day (TID) | ORAL | Status: DC
  Administered 2017-01-17 – 2017-01-19 (×7): 500 mg via ORAL
  Filled 2017-01-17 (×7): qty 1

## 2017-01-17 MED ORDER — ONDANSETRON HCL 4 MG/2ML IJ SOLN: 4.0000 mg | Freq: Four times a day (QID) | INTRAMUSCULAR | Status: DC | PRN

## 2017-01-17 MED ORDER — HEPARIN (PORCINE) IN NACL 100-0.45 UNIT/ML-% IJ SOLN
2200.0000 [IU]/h | INTRAMUSCULAR | Status: AC
  Administered 2017-01-17 (×2): 1400 [IU]/h via INTRAVENOUS
  Administered 2017-01-18: 1700 [IU]/h via INTRAVENOUS
  Administered 2017-01-19: 2200 [IU]/h via INTRAVENOUS
  Filled 2017-01-17 (×6): qty 250

## 2017-01-17 MED ORDER — GABAPENTIN 100 MG PO CAPS
200.0000 mg | ORAL_CAPSULE | Freq: Every day | ORAL | Status: DC
  Administered 2017-01-17 – 2017-01-18 (×2): 200 mg via ORAL
  Filled 2017-01-17 (×2): qty 2

## 2017-01-17 MED ORDER — POTASSIUM CHLORIDE CRYS ER 20 MEQ PO TBCR
40.0000 meq | EXTENDED_RELEASE_TABLET | ORAL | Status: AC
  Administered 2017-01-17: 40 meq via ORAL
  Filled 2017-01-17: qty 2

## 2017-01-17 MED ORDER — NICOTINE 21 MG/24HR TD PT24
21.0000 mg | MEDICATED_PATCH | Freq: Every day | TRANSDERMAL | Status: DC
  Administered 2017-01-17 – 2017-01-19 (×3): 21 mg via TRANSDERMAL
  Filled 2017-01-17 (×3): qty 1

## 2017-01-17 MED ORDER — ACETAMINOPHEN 650 MG RE SUPP: 650.0000 mg | Freq: Four times a day (QID) | RECTAL | Status: DC | PRN

## 2017-01-17 MED ORDER — ONDANSETRON HCL 4 MG PO TABS: 4.0000 mg | ORAL_TABLET | Freq: Four times a day (QID) | ORAL | Status: DC | PRN

## 2017-01-17 MED ORDER — RISPERIDONE 1 MG PO TABS
2.0000 mg | ORAL_TABLET | Freq: Every day | ORAL | Status: DC
  Administered 2017-01-17 – 2017-01-19 (×3): 2 mg via ORAL
  Filled 2017-01-17 (×3): qty 2

## 2017-01-17 MED ORDER — ATENOLOL 50 MG PO TABS
50.0000 mg | ORAL_TABLET | Freq: Every day | ORAL | Status: DC
Start: 2017-01-17 — End: 2017-01-19
  Administered 2017-01-17 – 2017-01-19 (×3): 50 mg via ORAL
  Filled 2017-01-17 (×4): qty 1

## 2017-01-17 MED ORDER — AMOXICILLIN 500 MG PO CAPS
500.0000 mg | ORAL_CAPSULE | Freq: Three times a day (TID) | ORAL | Status: DC
  Administered 2017-01-17: 500 mg via ORAL
  Filled 2017-01-17: qty 1

## 2017-01-17 MED ORDER — LORATADINE 10 MG PO TABS
10.0000 mg | ORAL_TABLET | Freq: Every day | ORAL | Status: DC
Start: 2017-01-17 — End: 2017-01-19
  Administered 2017-01-17 – 2017-01-19 (×3): 10 mg via ORAL
  Filled 2017-01-17 (×3): qty 1

## 2017-01-17 MED ORDER — HEPARIN BOLUS VIA INFUSION
6000.0000 [IU] | Freq: Once | INTRAVENOUS | Status: AC
  Administered 2017-01-17: 6000 [IU] via INTRAVENOUS
  Filled 2017-01-17: qty 6000

## 2017-01-17 MED ORDER — PANTOPRAZOLE SODIUM 40 MG PO TBEC
40.0000 mg | DELAYED_RELEASE_TABLET | Freq: Every day | ORAL | Status: DC
  Administered 2017-01-17 – 2017-01-19 (×3): 40 mg via ORAL
  Filled 2017-01-17 (×3): qty 1

## 2017-01-17 MED ORDER — METHOCARBAMOL 500 MG PO TABS: 500.0000 mg | ORAL_TABLET | Freq: Four times a day (QID) | ORAL | Status: DC | PRN

## 2017-01-17 MED ORDER — IOPAMIDOL (ISOVUE-370) INJECTION 76%
INTRAVENOUS | Status: AC
  Administered 2017-01-17: 100 mL
  Filled 2017-01-17: qty 100

## 2017-01-17 MED ORDER — TAMSULOSIN HCL 0.4 MG PO CAPS
0.4000 mg | ORAL_CAPSULE | Freq: Two times a day (BID) | ORAL | Status: DC
  Administered 2017-01-17 – 2017-01-19 (×5): 0.4 mg via ORAL
  Filled 2017-01-17 (×5): qty 1

## 2017-01-17 MED ORDER — IPRATROPIUM-ALBUTEROL 0.5-2.5 (3) MG/3ML IN SOLN: 3.0000 mL | RESPIRATORY_TRACT | Status: DC | PRN

## 2017-01-17 MED ORDER — ACETAMINOPHEN 325 MG PO TABS: 650.0000 mg | ORAL_TABLET | Freq: Four times a day (QID) | ORAL | Status: DC | PRN

## 2017-01-17 MED ORDER — RISPERIDONE 2 MG PO TABS: 2.0000 mg | ORAL_TABLET | Freq: Two times a day (BID) | ORAL | Status: DC

## 2017-01-17 MED ORDER — OXCARBAZEPINE 300 MG PO TABS
600.0000 mg | ORAL_TABLET | Freq: Two times a day (BID) | ORAL | Status: DC
  Administered 2017-01-17 – 2017-01-19 (×5): 600 mg via ORAL
  Filled 2017-01-17 (×6): qty 2

## 2017-01-17 MED ORDER — HEPARIN BOLUS VIA INFUSION
2400.0000 [IU] | Freq: Once | INTRAVENOUS | Status: AC
  Administered 2017-01-17: 2400 [IU] via INTRAVENOUS
  Filled 2017-01-17: qty 2400

## 2017-01-17 MED ORDER — ATORVASTATIN CALCIUM 40 MG PO TABS
40.0000 mg | ORAL_TABLET | Freq: Every day | ORAL | Status: DC
  Administered 2017-01-17 – 2017-01-19 (×3): 40 mg via ORAL
  Filled 2017-01-17 (×3): qty 1

## 2017-01-17 NOTE — Progress Notes (Signed)
Pt is stable, vitals stable, no an complain of pain, Echo done, pt is in Vascular for USG of leg now, IV heparin continue @14cc /hr without any complications. No any complain of pain, denies CP and distress, will continue to monitor the patient  Palma Holter, RN
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
## 2017-01-17 NOTE — Progress Notes (Signed)
  Echocardiogram 2D Echocardiogram has been performed.  Andre Cervantes 01/17/2017, 11:51 AM

## 2017-01-17 NOTE — ED Notes (Signed)
ED Provider at bedside. 

## 2017-01-17 NOTE — ED Notes (Signed)
Patient transported to CT 

## 2017-01-17 NOTE — Progress Notes (Signed)
ANTICOAGULATION CONSULT NOTE - Initial Consult  Pharmacy Consult for Heparin  Indication: pulmonary embolus, saddle   Allergies  Allergen Reactions  . Fentanyl Other (See Comments)    Patient developed severe chest wall rigidity following IV dose of Fentanyl for post op pain management. Reaction occurred on first and second administration of Fentanyl intraop producing significant drop in SaO2 and increased airway pressures.  . Morphine Swelling   Patient Measurements: Height: 5\' 9"  (175.3 cm) Weight: 186 lb (84.4 kg) IBW/kg (Calculated) : 70.7  Vital Signs: Temp: 98.4 F (36.9 C) (12/22 2053) Temp Source: Oral (12/22 2053) BP: 116/77 (12/23 0415) Pulse Rate: 80 (12/23 0415)  Labs: Recent Labs    01/16/17 2118  HGB 12.7*  HCT 38.2*  PLT 523*  CREATININE 1.08    Estimated Creatinine Clearance: 100 mL/min (by C-G formula based on SCr of 1.08 mg/dL).   Medical History: Past Medical History:  Diagnosis Date  . ADD (attention deficit disorder)   . Arthritis   . DVT (deep venous thrombosis) ([place]) 2013  . GERD (gastroesophageal reflux disease)   . Hypertension   . PE (pulmonary embolism) 13   hx    Assessment: 31 y/o M presents to the ED with right knee pain and shortness of breath. Found to have SADDLE PE via CT Angio. Pt has hx of DVT/PE but is currently on NO anti-coagulation. Hgb 12.7. Renal function good.   Goal of Therapy:  Heparin level 0.3-0.7 units/ml Monitor platelets by anticoagulation protocol: Yes   Plan:  Heparin 6000 units BOLUS Start heparin drip at 1400 units/hr 1300 HL Daily CBC/HL Monitor for bleeding   Narda Bonds 01/17/2017,4:48 AM

## 2017-01-17 NOTE — Progress Notes (Signed)
Pt's DVT positive for right leg. MD aware, heparin gtt continue @17  after 2400 units, pt is stable, no any complain of chest pain and dizziness, will continue to monitor

## 2017-01-17 NOTE — ED Notes (Signed)
Pt family is aware he needs a urine sample

## 2017-01-17 NOTE — ED Provider Notes (Signed)
[facility] EMERGENCY DEPARTMENT Provider Note   CSN: [id number] Arrival date & time: 01/16/17  2044     History   Chief Complaint Chief Complaint  Patient presents with  . Shortness of Breath  . Knee Pain    HPI Andre Cervantes is a 31 y.o. male.  Patient with PMH of PE, DVT, HTN, presents to the ED with a chief complaint of SOB.  He states that over the past few days he has noticed increasing SOB, especially with ambulation.  He denies any chest pain, cough, or fever. He states that he also has some right calf pain and attributes this to having slipped last week.  He is not anticoagulated.  He denies any other associated problems.   The history is provided by the patient. No language interpreter was used.    Past Medical History:  Diagnosis Date  . ADD (attention deficit disorder)   . Arthritis   . DVT (deep venous thrombosis) ([place]) 2013  . GERD (gastroesophageal reflux disease)   . Hypertension   . PE (pulmonary embolism) 13   hx    Patient Active Problem List   Diagnosis Date Noted  . Fusion of lumbar spine   . HCAP (healthcare-associated pneumonia)   . PE (pulmonary embolism)   . Pneumonia   . 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 S/P knee surgery 01/06/2013  . Status post knee surgery 01/05/2013  . Pulmonary emboli ([place]) 10/26/2011  . DVT (deep venous thrombosis) ([place]) 10/26/2011  . HYPERLIPIDEMIA 11/07/2009  . ANXIETY 11/07/2009  . TOBACCO ABUSE 11/07/2009  . DEPRESSION 11/07/2009  . ADHD 11/07/2009  . GERD 11/07/2009  . OTHER DYSPHAGIA 11/07/2009    Past Surgical History:  Procedure Laterality Date  . FRACTURE SURGERY     rt hand  . KNEE ARTHROSCOPY WITH LATERAL RELEASE Right 01/05/2013   Procedure: KNEE ARTHROSCOPY WITH DEBRIDEMENT, LATERAL RELEASE, CARTICEL PROCEDURE, OPEN ALLOGRAFT;  Surgeon: Sydnee Cabal, MD;  Location: [place];  Service: Orthopedics;  Laterality: Right;  . KNEE SURGERY  Bilateral 2013    knee  . MEDIAL PATELLOFEMORAL LIGAMENT REPAIR Right 01/05/2013   Procedure: MEDIAL PATELLA FEMORAL LIGAMENT RECONSTRUCTION;  Surgeon: Sydnee Cabal, MD;  Location: [place];  Service: Orthopedics;  Laterality: Right;  . rt hand little finger pinning  childhood       Home Medications    Prior to Admission medications   Medication Sig Start Date End Date Taking? Authorizing Provider  amitriptyline (ELAVIL) 25 MG tablet Take 50 mg by mouth at bedtime.     [provider]  atenolol (TENORMIN) 50 MG tablet Take 50 mg by mouth daily.    [provider]  enoxaparin (LOVENOX) 30 MG/0.3ML injection Inject 0.9 mLs (90 mg total) into the skin every 12 (twelve) hours. 06/19/14   Desai, Rahul P, PA-C  fenofibrate micronized (LOFIBRA) 134 MG capsule Take 134 mg by mouth at bedtime.     [provider]  Gabapentin Enacarbil ER 300 MG TBCR Take 1 capsule by mouth 2 (two) times daily as needed (Takes 1 daily, can take another if needed).    [provider]  lovastatin (MEVACOR) 40 MG tablet Take 40 mg by mouth at bedtime.    [provider]  methocarbamol (ROBAXIN) 500 MG tablet Take 1 tablet (500 mg total) by mouth every 6 (six) hours as needed for muscle spasms. Patient not taking: Reported on 05/30/2014 01/06/13   Lajean Manes, PA-C  naproxen (NAPROSYN) 500 MG tablet Take 1 tablet (500 mg total) by mouth 2 (two) times daily with a meal. 06/26/14   Noemi Chapel, MD  oxyCODONE (ROXICODONE) 5 MG immediate release tablet Take 1-2 tablets (5-10 mg total) by mouth every 4 (four) hours as needed for severe pain. 06/19/14   Kritzer, Louie Casa, MD  risperiDONE (RISPERDAL) 1 MG tablet Take 3 mg by mouth at bedtime.     [provider]  tamsulosin (FLOMAX) 0.4 MG CAPS capsule Take 0.4 mg by mouth daily.     [provider]  warfarin (COUMADIN) 6 MG tablet Take 6 mg by mouth daily.    [provider]     Family History No family history on file.  Social History Social History   Tobacco Use  . Smoking status: Never Smoker  . Smokeless tobacco: Current User    Types: Chew  . Tobacco comment: occ chew  Substance Use Topics  . Alcohol use: Yes    Comment: occ beer  . Drug use: Yes    Types: Other-see comments    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     Allergies   Fentanyl and Morphine   Review of Systems Review of Systems  All other systems reviewed and are negative.    Physical Exam Updated Vital Signs BP 117/84   Pulse 76   Temp 98.4 F (36.9 C) (Oral)   Resp 16   Ht 5\' 9"  (1.753 m)   Wt 84.4 kg (186 lb)   SpO2 92%   BMI 27.47 kg/m   Physical Exam  Constitutional: He is oriented to person, place, and time. He appears well-developed and well-nourished.  HENT:  Head: Normocephalic and atraumatic.  Eyes: Conjunctivae and EOM are normal. Pupils are equal, round, and reactive to light. Right eye exhibits no discharge. Left eye exhibits no discharge. No scleral icterus.  Neck: Normal range of motion. Neck supple. No JVD present.  Cardiovascular: Normal rate, regular rhythm and normal heart sounds. Exam reveals no gallop and no friction rub.  No murmur heard. Pulmonary/Chest: Effort normal and breath sounds normal. No respiratory distress. He has no wheezes. He has no rales. He exhibits no tenderness.  CTAB  Abdominal: Soft. He exhibits no distension and no mass. There is no tenderness. There is no rebound and no guarding.  Musculoskeletal: Normal range of motion. He exhibits no edema or tenderness.  Right calf tenderness  Neurological: He is alert and oriented to person, place, and time.  Skin: Skin is warm and dry.  Psychiatric: He has a normal mood and affect. His behavior is normal. Judgment and thought content normal.  Nursing note and vitals reviewed.    ED Treatments / Results  Labs (all labs ordered are listed, but only abnormal results are  displayed) Labs Reviewed  CBC - Abnormal; Notable for the following components:      Result Value   WBC 12.9 (*)    Hemoglobin 12.7 (*)    HCT 38.2 (*)    Platelets 523 (*)    All other components within normal limits  BASIC METABOLIC PANEL - Abnormal; Notable for the following components:   Potassium 3.3 (*)    Glucose, Bld 110 (*)    All other components within normal limits  D-DIMER, QUANTITATIVE (NOT AT [facility]) - Abnormal; Notable for the following components:   D-Dimer, Quant 4.01 (*)    All other components within normal limits    EKG  EKG Interpretation  Date/Time:  Saturday January 16 2017 20:51:12 EST Ventricular Rate:  94 PR Interval:  162 QRS Duration: 76 QT Interval:  342 QTC Calculation: 427 R Axis:   6 Text Interpretation:  Normal sinus rhythm Normal ECG No significant change since last tracing Confirmed by Pryor Curia [phone]) on 01/17/2017 1:28:48 AM       Radiology Dg Chest 2 View  Result Date: 01/16/2017 CLINICAL DATA:  Acute onset of shortness of breath and productive cough. Sneezing. EXAM: CHEST  2 VIEW COMPARISON:  Chest radiograph performed 02/25/2016 FINDINGS: The lungs are well-aerated and clear. There is no evidence of focal opacification, pleural effusion or pneumothorax. The heart is normal in size; the mediastinal contour is within normal limits. No acute osseous abnormalities are seen. IMPRESSION: No acute cardiopulmonary process seen. Electronically Signed   By: Garald Balding M.D.   On: 01/16/2017 21:57   Ct Angio Chest Pe W And/or Wo Contrast  Result Date: 01/17/2017 CLINICAL DATA:  Status post fall in snow a week ago, with acute onset of shortness of breath and productive cough. Sneezing. EXAM: CT ANGIOGRAPHY CHEST WITH CONTRAST TECHNIQUE: Multidetector CT imaging of the chest was performed using the standard protocol during bolus administration of intravenous contrast. Multiplanar CT image reconstructions and MIPs were obtained to evaluate  the vascular anatomy. CONTRAST:  165mL ISOVUE-370 IOPAMIDOL (ISOVUE-370) INJECTION 76% COMPARISON:  Chest radiograph performed 01/16/2017, and CTA of the chest performed 12/19/2014 FINDINGS: Cardiovascular: A large saddle pulmonary embolus is noted, extending into all lobes of both lungs, though only minimally seen at the right upper lobe. The RV/LV ratio is 0.84. The heart remains normal in size. The thoracic aorta is unremarkable. The great vessels are within normal limits. Mediastinum/Nodes: The mediastinum is otherwise unremarkable in appearance. No mediastinal lymphadenopathy is seen. No pericardial effusion is identified. The visualized portions of the thyroid gland are unremarkable. No axillary lymphadenopathy is seen. Lungs/Pleura: There is a mildly mosaic pattern of parenchymal attenuation within both lungs, without definite evidence of pulmonary infarct at this time. No pleural effusion or pneumothorax is seen. No mass is identified. Upper Abdomen: The visualized portions of the liver and spleen are unremarkable. The visualized portions of the gallbladder, pancreas and adrenal glands are within normal limits. Musculoskeletal: No acute osseous abnormalities are identified. The visualized musculature is unremarkable in appearance. Review of the MIP images confirms the above findings. IMPRESSION: 1. Large saddle pulmonary embolus, extending into all lobes of both lungs. RV/LV ratio is 0.84. 2. Mildly mosaic pattern of parenchymal attenuation within both lungs, without definite evidence of pulmonary infarct at this time. Critical Value/emergent results were called by telephone at the time of interpretation on 01/17/2017 at 3:38 am to [facility] PA, who verbally acknowledged these results. Electronically Signed   By: Garald Balding M.D.   On: 01/17/2017 03:42    Procedures Procedures (including critical care time) CRITICAL CARE Performed by: Montine Circle   Total critical care time: 39  minutes  Critical care time was exclusive of separately billable procedures and treating other patients.  Critical care was necessary to treat or prevent imminent or life-threatening deterioration.  Critical care was time spent personally by me on the following activities: development of treatment plan with patient and/or surrogate as well as nursing, discussions with consultants, evaluation of patient's response to treatment, examination of patient, obtaining history from patient or surrogate, ordering and performing treatments and interventions, ordering and review of laboratory studies, ordering and review of radiographic studies, pulse oximetry and re-evaluation  of patient's condition.  Medications Ordered in ED Medications  iopamidol (ISOVUE-370) 76 % injection (100 mLs  Contrast Given 01/17/17 0325)     Initial Impression / Assessment and Plan / ED Course  I have reviewed the triage vital signs and the nursing notes.  Pertinent labs & imaging results that were available during my care of the patient were reviewed by me and considered in my medical decision making (see chart for details).     Patient with SOB.  Hx of PE.  Slipped last week and reports right calf pain.  Basic labs and CXR are reassuring, will check d-dimer given hx.    CT PE study is consistent with saddle PE.  Patient is not hypoxic nor tachycardic.  Will start heparin drip.  Appreciate Dr. Harvest Forest from [facility] for admitting the patient.  Final Clinical Impressions(s) / ED Diagnoses   Final diagnoses:  Acute saddle pulmonary embolism without acute cor pulmonale [facility])    ED Discharge Orders    None       Montine Circle, PA-C 01/17/17 0400    Ward, Delice Bison, DO 01/17/17 [phone]

## 2017-01-17 NOTE — Progress Notes (Signed)
Patient is seen and examined. Please see today's H&P for the details. 31 y/o male with PMH of Developmental delay, DVT/PE not on anticoagulation presented with SOB/DOE and found to have PE. 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 Previous Echo (2013). LVEF 65-70% RV pressure overload. Right ventricle: The cavity size was mildly dilated. Systolic function was mildly reduced. Pulmonary arteries: PA peak pressure: 28mm Hg. -No acute chets pains, no acute hypoxia, no tachycardia, no hemodynamic instability. Cont iv heparin, pend echo, doppler US. Will transition to oral NOAC in 24 hrs. Needs lifelong anticoagulation  Federico Maiorino N

## 2017-01-17 NOTE — Progress Notes (Addendum)
[facility] for Heparin  Indication: pulmonary embolus, saddle   Allergies  Allergen Reactions  . Fentanyl Other (See Comments)    Patient developed severe chest wall rigidity following IV dose of Fentanyl for post op pain management. Reaction occurred on first and second administration of Fentanyl intraop producing significant drop in SaO2 and increased airway pressures.  . Morphine Swelling   Patient Measurements: Height: 5\' 9"  (175.3 cm) Weight: 179 lb 9.6 oz (81.5 kg) IBW/kg (Calculated) : 70.7  Vital Signs: Temp: 98.4 F (36.9 C) (12/23 1221) Temp Source: Oral (12/23 1221) BP: 112/67 (12/23 1221) Pulse Rate: 68 (12/23 1221)  Labs: Recent Labs    01/16/17 2118 01/17/17 1207  HGB 12.7*  --   HCT 38.2*  --   PLT 523*  --   HEPARINUNFRC  --  0.19*  CREATININE 1.08  --     Estimated Creatinine Clearance: 100 mL/min (by C-G formula based on SCr of 1.08 mg/dL).   Medical History: Past Medical History:  Diagnosis Date  . ADD (attention deficit disorder)   . Arthritis   . DVT (deep venous thrombosis) ([place]) 2013  . GERD (gastroesophageal reflux disease)   . Hypertension   . PE (pulmonary embolism) 13   hx    Assessment: 31 y/o M presents to the ED with right knee pain and shortness of breath. Found to have large SADDLE PE. Pt has hx of DVT/PE but is currently on NO anti-coagulation PTA. No hx of bleeding noted.  Initial heparin level low at 0.19. Hgb 12.7, plt 523. No bleed or IV line issues per RN, but she does state that patient is currently in vascular lab for [place] and will be unable to adjust heparin until he gets back to the floor.   Goal of Therapy:  Heparin level 0.3-0.7 units/ml Monitor platelets by anticoagulation protocol: Yes   Plan:  Heparin 2400 unit BOLUS Increase heparin drip to 1700 units/hr 6h heparin level Daily CBC/heparin level Monitor for bleeding F/u long-term anticoagulation plan  Elicia Lamp,  PharmD, BCPS Clinical Pharmacist Clinical phone for 01/17/2017 until 3:30pm: [phone] If after 3:30pm, please call main pharmacy at: [phone] 01/17/2017 2:02 PM   ADDENDUM:  Heparin level now therapeutic after rate increase. No bleed documented. Dopplers + for DVT in RLE.  Plan: Continue heparin at 1700 units/hr Confirmatory heparin level with AM labs Daily heparin level/CBC Monitor for s/sx bleeding  Elicia Lamp, PharmD, BCPS Clinical Pharmacist 01/17/2017 9:47 PM

## 2017-01-17 NOTE — Progress Notes (Addendum)
Bilateral lower extremity venous duplex completed. RIGHT -  Positive for an acute occlusive DVT coursing from the ankle posterior tibial and peroneal veins through the popliteal.gastrocnemius and becomes partially occlusive in the femoral, and distal common femoral vein. LEFT - No evidence of a DVT, superficial thrombosis or Baker's cyst. Toma Copier, RVS 01/17/2017, 2:54 PM

## 2017-01-17 NOTE — Plan of Care (Signed)
  Activity: Risk for activity intolerance will decrease 01/17/2017 0703 - Progressing by Con Arganbright A, RN   Coping: Level of anxiety will decrease 01/17/2017 0703 - Progressing by Talton Delpriore A, RN   Pain Managment: General experience of comfort will improve 01/17/2017 0703 - Progressing by Melik Blancett, Roma Kayser, RN

## 2017-01-17 NOTE — Progress Notes (Signed)
Admitted pt from ED. Oriented X4. Heparin at 14cc/hr infusing to rt ac. Tele on. VSS.

## 2017-01-18 DIAGNOSIS — K047 Periapical abscess without sinus: Secondary | ICD-10-CM

## 2017-01-18 DIAGNOSIS — K219 Gastro-esophageal reflux disease without esophagitis: Secondary | ICD-10-CM

## 2017-01-18 DIAGNOSIS — I82401 Acute embolism and thrombosis of unspecified deep veins of right lower extremity: Secondary | ICD-10-CM

## 2017-01-18 DIAGNOSIS — I2692 Saddle embolus of pulmonary artery without acute cor pulmonale: Principal | ICD-10-CM

## 2017-01-18 DIAGNOSIS — F908 Attention-deficit hyperactivity disorder, other type: Secondary | ICD-10-CM

## 2017-01-18 LAB — CBC
HCT: 35.7 % — ABNORMAL LOW (ref 39.0–52.0)
Hemoglobin: 11.5 g/dL — ABNORMAL LOW (ref 13.0–17.0)
MCH: 29.7 pg (ref 26.0–34.0)
MCHC: 32.2 g/dL (ref 30.0–36.0)
MCV: 92.2 fL (ref 78.0–100.0)
PLATELETS: 595 10*3/uL — AB (ref 150–400)
RBC: 3.87 MIL/uL — ABNORMAL LOW (ref 4.22–5.81)
RDW: 12.5 % (ref 11.5–15.5)
WBC: 11.8 10*3/uL — ABNORMAL HIGH (ref 4.0–10.5)

## 2017-01-18 LAB — BASIC METABOLIC PANEL
ANION GAP: 8 (ref 5–15)
BUN: 11 mg/dL (ref 6–20)
CALCIUM: 9.3 mg/dL (ref 8.9–10.3)
CO2: 22 mmol/L (ref 22–32)
CREATININE: 0.94 mg/dL (ref 0.61–1.24)
Chloride: 109 mmol/L (ref 101–111)
GLUCOSE: 92 mg/dL (ref 65–99)
Potassium: 3.9 mmol/L (ref 3.5–5.1)
Sodium: 139 mmol/L (ref 135–145)

## 2017-01-18 LAB — HEPARIN LEVEL (UNFRACTIONATED)
HEPARIN UNFRACTIONATED: 0.25 [IU]/mL — AB (ref 0.30–0.70)
Heparin Unfractionated: 0.25 IU/mL — ABNORMAL LOW (ref 0.30–0.70)
Heparin Unfractionated: 0.69 IU/mL (ref 0.30–0.70)

## 2017-01-18 LAB — GLUCOSE, CAPILLARY
Glucose-Capillary: 106 mg/dL — ABNORMAL HIGH (ref 65–99)
Glucose-Capillary: 100 mg/dL — ABNORMAL HIGH (ref 65–99)

## 2017-01-18 MED ORDER — ELIQUIS 5 MG VTE STARTER PACK: ORAL_TABLET | ORAL | 0 refills | Status: DC

## 2017-01-18 MED ORDER — APIXABAN 5 MG PO TABS: ORAL_TABLET | ORAL | 0 refills | Status: DC

## 2017-01-18 NOTE — Progress Notes (Signed)
[facility] for Heparin  Indication: pulmonary embolus  Allergies  Allergen Reactions  . Fentanyl Other (See Comments)    Patient developed severe chest wall rigidity following IV dose of Fentanyl for post op pain management. Reaction occurred on first and second administration of Fentanyl intraop producing significant drop in SaO2 and increased airway pressures.  . Morphine Swelling   Patient Measurements: Height: 5\' 9"  (175.3 cm) Weight: 180 lb 1.6 oz (81.7 kg) IBW/kg (Calculated) : 70.7  Vital Signs: Temp: 98.3 F (36.8 C) (12/24 1947) Temp Source: Oral (12/24 1947) BP: 119/74 (12/24 1947) Pulse Rate: 72 (12/24 1947)  Labs: Recent Labs    01/16/17 2118  01/18/17 0538 01/18/17 1445 01/18/17 2244  HGB 12.7*  --  11.5*  --   --   HCT 38.2*  --  35.7*  --   --   PLT 523*  --  595*  --   --   HEPARINUNFRC  --    < > 0.25* 0.25* 0.69  CREATININE 1.08  --  0.94  --   --    < > = values in this interval not displayed.    Estimated Creatinine Clearance: 114.9 mL/min (by C-G formula based on SCr of 0.94 mg/dL).  Assessment: 31 y.o. male with PE/DVT for heparin   Goal of Therapy:  Heparin level 0.3-0.7 units/ml Monitor platelets by anticoagulation protocol: Yes   Plan:  Continue Heparin at current rate Follow-up am labs.  Phillis Knack, PharmD, BCPS 01/18/2017 11:55 PM

## 2017-01-18 NOTE — Progress Notes (Signed)
[facility] for Heparin  Indication: pulmonary embolus, saddle   Allergies  Allergen Reactions  . Fentanyl Other (See Comments)    Patient developed severe chest wall rigidity following IV dose of Fentanyl for post op pain management. Reaction occurred on first and second administration of Fentanyl intraop producing significant drop in SaO2 and increased airway pressures.  . Morphine Swelling   Patient Measurements: Height: 5\' 9"  (175.3 cm) Weight: 180 lb 1.6 oz (81.7 kg) IBW/kg (Calculated) : 70.7  Vital Signs: Temp: 98.5 F (36.9 C) (12/24 1148) Temp Source: Oral (12/24 1148) BP: 99/53 (12/24 1148) Pulse Rate: 69 (12/24 1148)  Labs: Recent Labs    01/16/17 2118  01/17/17 2053 01/18/17 0538 01/18/17 1445  HGB 12.7*  --   --  11.5*  --   HCT 38.2*  --   --  35.7*  --   PLT 523*  --   --  595*  --   HEPARINUNFRC  --    < > 0.43 0.25* 0.25*  CREATININE 1.08  --   --  0.94  --    < > = values in this interval not displayed.    Estimated Creatinine Clearance: 114.9 mL/min (by C-G formula based on SCr of 0.94 mg/dL).   Medical History: Past Medical History:  Diagnosis Date  . ADD (attention deficit disorder)   . Arthritis   . DVT (deep venous thrombosis) ([place]) 2013  . GERD (gastroesophageal reflux disease)   . Hypertension   . PE (pulmonary embolism) 13   hx    Assessment: 31 y/o M presented to the ED with right knee pain and shortness of breath. Found to have large SADDLE PE and dopplers + for DVT in RLE.Marland Kitchen Pt has hx of DVT/PE but is currently on NO anti-coagulation PTA.  Heparin level is still below goal this evening, no bleeding or IV issues noted. Will adjust.    Goal of Therapy:  Heparin level 0.3-0.7 units/ml Monitor platelets by anticoagulation protocol: Yes   Plan:  -Increase heparin to 2200 units/hr -Heparin level in 6 hours and daily wth CBC daily  Erin Hearing PharmD., BCPS Clinical Pharmacist 01/18/2017 3:48  PM

## 2017-01-18 NOTE — Progress Notes (Signed)
[facility] for Heparin  Indication: pulmonary embolus, saddle   Allergies  Allergen Reactions  . Fentanyl Other (See Comments)    Patient developed severe chest wall rigidity following IV dose of Fentanyl for post op pain management. Reaction occurred on first and second administration of Fentanyl intraop producing significant drop in SaO2 and increased airway pressures.  . Morphine Swelling   Patient Measurements: Height: 5\' 9"  (175.3 cm) Weight: 180 lb 1.6 oz (81.7 kg) IBW/kg (Calculated) : 70.7  Vital Signs: Temp: 97.3 F (36.3 C) (12/24 0557) Temp Source: Oral (12/24 0557) BP: 96/67 (12/24 0557) Pulse Rate: 72 (12/24 0557)  Labs: Recent Labs    01/16/17 2118 01/17/17 1207 01/17/17 2053 01/18/17 0538  HGB 12.7*  --   --  11.5*  HCT 38.2*  --   --  35.7*  PLT 523*  --   --  595*  HEPARINUNFRC  --  0.19* 0.43 0.25*  CREATININE 1.08  --   --  0.94    Estimated Creatinine Clearance: 114.9 mL/min (by C-G formula based on SCr of 0.94 mg/dL).   Medical History: Past Medical History:  Diagnosis Date  . ADD (attention deficit disorder)   . Arthritis   . DVT (deep venous thrombosis) ([place]) 2013  . GERD (gastroesophageal reflux disease)   . Hypertension   . PE (pulmonary embolism) 13   hx    Assessment: 31 y/o M presented to the ED with right knee pain and shortness of breath. Found to have large SADDLE PE and dopplers + for DVT in RLE.Marland Kitchen Pt has hx of DVT/PE but is currently on NO anti-coagulation PTA. -heparin level is below goal    Goal of Therapy:  Heparin level 0.3-0.7 units/ml Monitor platelets by anticoagulation protocol: Yes   Plan:  -Increase heparin to 1900 units/hr -Heparin level in 6 hours and daily wth CBC daily  Hildred Laser, Pharm D 01/18/2017 8:06 AM

## 2017-01-18 NOTE — Progress Notes (Addendum)
PROGRESS NOTE   Andre Cervantes  PHX:[id number]    DOB: [birth year]    DOA: 01/17/2017  PCP: Aldona Bar, MD   I have briefly reviewed patients previous medical records in [facility].  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 CTA chest confirmed saddle PE. Remained hemodynamically stable and not hypoxic. 2-D echo without right heart strain. Lower extremity venous Dopplers confirm extensive right lower extremity DVT. Has been on IV heparin infusion since 12/23 approximately 4 AM.   Assessment & Plan:   Principal Problem:   Saddle pulmonary embolus (HCC) Active Problems:   Anxiety state   Depression   Attention deficit hyperactivity disorder (ADHD)   GERD   Dental infection   Leukocytosis   1. Acute saddle pulmonary embolus due to right lower extremity extensive DVT: CTA chest confirms large saddle pulmonary embolus extending into all lobes of both lungs. No right heart strain reported. Lower extremity venous Doppler confirms occlusive right lower extremity DVT. He has remained hemodynamically stable and not hypoxic. Started on IV heparin infusion on admission at around 4 AM on 12/23. Given large clot burden, continue IV heparin for additional 24 hours before transitioning to oral anticoagulation that is to be discussed with his father. May need outpatient hematology consultation to further evaluate for cause but may have to be on lifelong anticoagulation. Not sure if this event was provoked due to fall, right knee injury, decreased mobility leading to DVT followed by PE. I discussed in detail with patient's father and stepmother on 12/24. Discussed in detail regarding options for oral anticoagulation including warfarin with heparin/Lovenox bridge,  NOACs including Xarelto and Eliquis and Pradaxa. Discussed risks and benefits of each. They decided on proceeding with Eliquis. I have sent in a prescription to his listed pharmacy today (they indicated that the pharmacy is likely to be closed for Christmas tomorrow) and advised them to follow with with pharmacy to date to check regarding insurance coverage and medication pickup. Case management consulted for discount coupon. 2. Recent dental infection: Continue amoxicillin. Given recent DVT/PE, dental extractions may have to be put on hold for couple of months. 3. Essential hypertension: Controlled. Continue atenolol. 4. Hyperlipidemia: Continue atorvastatin and fenofibrate. 5. Hypokalemia: Replaced. 6. Anemia: Is on IV heparin drip. No bleeding reported. Follow CBC in a.m. 7. Developmental delay/ADHD/anxiety & depression: Continue Trileptal and Risperdal. Stable. 8. GERD: Continue PPI. 9. Chewing tobacco use: Continue nicotine patch. Is in the process of trying to quit.   DVT prophylaxis: Remains on therapeutic IV heparin infusion. Code Status: Full Family Communication: Discussed with father & step mother. Updated care and answered questions. Disposition: DC home when medically improved.   Consultants:  None   Procedures:   Bilateral lower extremity venous Dopplers 12/18/16: Final Interpretation Right: There is evidence of acute deep vein thrombus in the Common Femoral vein, Femoral vein, Popliteal vein, Posterior Tibial vein, Peroneal vein, and Gastrocnemius vein. There is no evidence of deep vein thrombosis of the mid to proximal common  femoral vein. There is no obvious evidence of a superfical thrombosis. There is no evidence of a Baker's cyst. Left: There is no evidence of deep vein thrombosis in the lower extremity.There is no evidence of superficial venous thrombosis. There is no evidence of a Baker's  cyst.   TTE 01/17/17: Study Conclusions  - Left ventricle: The cavity  size was normal. Wall thickness was   normal. Systolic function was normal. The estimated ejection   fraction was in the range of 55% to 60%. Wall motion was normal;   there were no regional wall motion abnormalities. - Mitral valve: There was mild regurgitation. - Right ventricle: The cavity size was normal. Wall thickness was   normal. Systolic function was normal. - Tricuspid valve: There was mild regurgitation  Antimicrobials:  Amoxicillin, PTA    Subjective: Denies complaints. No chest pain, cough, dyspnea, dizziness, lightheadedness or bleeding. Reports that he was on oral warfarin for about 3-1/2 years due to "clot" and stopped taking it in 2016.   ROS: As above.  Objective:  Vitals:   01/17/17 1924 01/17/17 2343 01/18/17 0557 01/18/17 1148  BP: 108/60 96/62 96/67  (!) 99/53  Pulse: 76 62 72 69  Resp: 18 18 18 20   Temp: 98.4 F (36.9 C) 98.7 F (37.1 C) (!) 97.3 F (36.3 C) 98.5 F (36.9 C)  TempSrc: Oral Oral Oral Oral  SpO2: 97% 97% 98% 96%  Weight:   81.7 kg (180 lb 1.6 oz)   Height:        Examination:  General exam: Pleasant young male, moderately built and nourished, lying comfortably supine in bed. Respiratory system: Clear to auscultation. Respiratory effort normal. Cardiovascular system: S1 & S2 heard, RRR. No JVD, murmurs, rubs, gallops or clicks. No pedal edema. Telemetry personally reviewed: Sinus rhythm. Gastrointestinal system: Abdomen is nondistended, soft and nontender. No organomegaly or masses felt. Normal bowel sounds heard. Central nervous system: Alert and oriented. No focal neurological deficits. Extremities: Symmetric 5 x 5 power. Skin: No rashes, lesions or ulcers Psychiatry: Judgement and insight may be slightly impaired. Mood & affect appropriate.     Data Reviewed: I have personally reviewed following labs and imaging studies  CBC: Recent Labs  Lab 01/16/17 2118 01/18/17 0538  WBC 12.9* 11.8*  HGB 12.7* 11.5*  HCT 38.2* 35.7*    MCV 90.5 92.2  PLT 523* 277*   Basic Metabolic Panel: Recent Labs  Lab 01/16/17 2118 01/18/17 0538  NA 135 139  K 3.3* 3.9  CL 102 109  CO2 22 22  GLUCOSE 110* 92  BUN 14 11  CREATININE 1.08 0.94  CALCIUM 9.3 9.3   CBG: Recent Labs  Lab 01/18/17 1109  GLUCAP 106*     Radiology Studies: Dg Chest 2 View  Result Date: 01/16/2017 CLINICAL DATA:  Acute onset of shortness of breath and productive cough. Sneezing. EXAM: CHEST  2 VIEW COMPARISON:  Chest radiograph performed 02/25/2016 FINDINGS: The lungs are well-aerated and clear. There is no evidence of focal opacification, pleural effusion or pneumothorax. The heart is normal in size; the mediastinal contour is within normal limits. No acute osseous abnormalities are seen. IMPRESSION: No acute cardiopulmonary process seen. Electronically Signed   By: Garald Balding M.D.   On: 01/16/2017 21:57   Ct Angio Chest Pe W And/or Wo Contrast  Result Date: 01/17/2017 CLINICAL DATA:  Status post fall in snow a week ago, with acute onset of shortness of breath and productive cough. Sneezing. EXAM: CT ANGIOGRAPHY CHEST WITH CONTRAST TECHNIQUE: Multidetector CT imaging of the chest was performed using the standard protocol during bolus administration of intravenous contrast. Multiplanar CT image reconstructions and MIPs were obtained to evaluate the vascular anatomy. CONTRAST:  132mL ISOVUE-370 IOPAMIDOL (ISOVUE-370) INJECTION 76% COMPARISON:  Chest radiograph performed 01/16/2017, and  CTA of the chest performed 12/19/2014 FINDINGS: Cardiovascular: A large saddle pulmonary embolus is noted, extending into all lobes of both lungs, though only minimally seen at the right upper lobe. The RV/LV ratio is 0.84. The heart remains normal in size. The thoracic aorta is unremarkable. The great vessels are within normal limits. Mediastinum/Nodes: The mediastinum is otherwise unremarkable in appearance. No mediastinal lymphadenopathy is seen. No pericardial  effusion is identified. The visualized portions of the thyroid gland are unremarkable. No axillary lymphadenopathy is seen. Lungs/Pleura: There is a mildly mosaic pattern of parenchymal attenuation within both lungs, without definite evidence of pulmonary infarct at this time. No pleural effusion or pneumothorax is seen. No mass is identified. Upper Abdomen: The visualized portions of the liver and spleen are unremarkable. The visualized portions of the gallbladder, pancreas and adrenal glands are within normal limits. Musculoskeletal: No acute osseous abnormalities are identified. The visualized musculature is unremarkable in appearance. Review of the MIP images confirms the above findings. IMPRESSION: 1. Large saddle pulmonary embolus, extending into all lobes of both lungs. RV/LV ratio is 0.84. 2. Mildly mosaic pattern of parenchymal attenuation within both lungs, without definite evidence of pulmonary infarct at this time. Critical Value/emergent results were called by telephone at the time of interpretation on 01/17/2017 at 3:38 am to [facility] PA, who verbally acknowledged these results. Electronically Signed   By: Garald Balding M.D.   On: 01/17/2017 03:42        Scheduled Meds: . amoxicillin  500 mg Oral Q8H  . atenolol  50 mg Oral Daily  . atorvastatin  40 mg Oral Daily  . fenofibrate  160 mg Oral Daily  . gabapentin  100 mg Oral Daily   And  . gabapentin  200 mg Oral QHS  . loratadine  10 mg Oral Daily  . nicotine  21 mg Transdermal Daily  . Oxcarbazepine  600 mg Oral BID  . pantoprazole  40 mg Oral Daily  . risperiDONE  2 mg Oral Daily   And  . risperiDONE  4 mg Oral QHS  . tamsulosin  0.4 mg Oral BID   Continuous Infusions: . heparin 1,900 Units/hr (01/18/17 0818)     LOS: 1 day     Vernell Leep, MD, FACP, [facility]. [facility] Pager [phone]  If 7PM-7AM, please contact night-coverage [email or web address] Password [email or web address] 01/18/2017, 12:12 PM

## 2017-01-19 LAB — HEPARIN LEVEL (UNFRACTIONATED): Heparin Unfractionated: 0.67 IU/mL (ref 0.30–0.70)

## 2017-01-19 LAB — CBC
HEMATOCRIT: 37.7 % — AB (ref 39.0–52.0)
HEMOGLOBIN: 12.5 g/dL — AB (ref 13.0–17.0)
MCH: 30 pg (ref 26.0–34.0)
MCHC: 33.2 g/dL (ref 30.0–36.0)
MCV: 90.4 fL (ref 78.0–100.0)
Platelets: 745 10*3/uL — ABNORMAL HIGH (ref 150–400)
RBC: 4.17 MIL/uL — ABNORMAL LOW (ref 4.22–5.81)
RDW: 12.1 % (ref 11.5–15.5)
WBC: 11.2 10*3/uL — ABNORMAL HIGH (ref 4.0–10.5)

## 2017-01-19 MED ORDER — ACETAMINOPHEN 325 MG PO TABS: 650.0000 mg | ORAL_TABLET | Freq: Four times a day (QID) | ORAL | Status: DC | PRN

## 2017-01-19 MED ORDER — APIXABAN 5 MG PO TABS
10.0000 mg | ORAL_TABLET | Freq: Two times a day (BID) | ORAL | Status: DC
  Administered 2017-01-19: 10 mg via ORAL
  Filled 2017-01-19: qty 2

## 2017-01-19 MED ORDER — APIXABAN 5 MG PO TABS: 5.0000 mg | ORAL_TABLET | Freq: Two times a day (BID) | ORAL | Status: DC

## 2017-01-19 NOTE — Discharge Summary (Addendum)
Physician Discharge Summary  OSHAE SIMMERING EYC:[id number] DOB: [birth year]  PCP: Aldona Bar, MD  Admit date: 01/17/2017 Discharge date: 01/19/2017  Recommendations for Outpatient Follow-up:  1. Dr. Aldona Bar, PCP in one week with repeat labs (CBC & BMP). 2. Consider outpatient Hematology consultation for further evaluation and management of recurrent DVT/PE.  Home Health: None Equipment/Devices: None    Discharge Condition: Improved and stable  CODE STATUS: Full  Diet recommendation: Heart healthy diet.  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 CTA chest confirmed saddle PE. Remained hemodynamically stable and not hypoxic. 2-D echo without right heart strain. Lower extremity venous Dopplers confirm extensive right lower extremity DVT.   Assessment & Plan:  1. Acute saddle pulmonary embolus due to right lower extremity extensive DVT: CTA chest confirmed large saddle pulmonary embolus extending into all lobes of both lungs. No right heart strain reported on CT. Lower extremity venous Doppler confirmed occlusive right lower extremity DVT. Since admission, he has remained hemodynamically stable and not hypoxic. He completed >48 hours of IV heparin anticoagulation in the hospital. I discussed in detail with patient's father and stepmother on 12/24 regarding options for oral anticoagulation including warfarin with heparin/Lovenox bridge, NOACs including Xarelto/Eliquis and Pradaxa. Discussed risks and benefits of each. They decided on  proceeding with Eliquis. Prescription was sent for same to his pharmacy yesterday and confirmed with parents today that they have picked up the medications yesterday (pharmacy likely closed today due to Christmas). Case management consulted for discount coupon. He was transitioned to Eliquis and received the first dose in the hospital this morning. Duration of anticoagulation will likely be lifelong given recurrence and extent of clot burden. May need outpatient hematology consultation to further evaluate for cause. Not sure if this event was provoked due to fall, right knee injury, decreased mobility leading to DVT followed by PE. I discussed with patient's parents regarding precautions while on anticoagulation i.e. avoiding injuries, falls, cuts etc. I also counseled them that any surgical procedure, unless deemed absolutely necessary, will need to be postponed for several months. They verbalized understanding. 2. Recent dental infection:  complete outpatient course of amoxicillin. Given recent DVT/PE, dental extractions may have to be put on hold for couple of months. 3. Essential hypertension: Controlled. Continue atenolol. 4. Hyperlipidemia: Continue atorvastatin and fenofibrate. 5. Hypokalemia: Replaced. 6. Anemia:  stable. Follow CBCs as outpatient. 7. Thrombocytosis: Unclear etiology.? Reactive. However upon reviewing his chart, he has had elevated platelet count of 650 even in May 2016. Recommend outpatient hematology consultation. Not sure if this has anything to do with his procoagulant state. 8. Developmental delay/ADHD/anxiety & depression: Continue Trileptal and Risperdal. Stable. 9. GERD: Continue PPI. 10. Chewing tobacco use: Continue nicotine patch. Is in the process of trying to quit.    Consultants:  None   Procedures:   Bilateral lower extremity venous Dopplers 12/18/16: Final Interpretation Right: There is evidence of acute deep vein thrombus in the Common Femoral vein,  Femoral vein, Popliteal vein, Posterior Tibial vein, Peroneal vein, and Gastrocnemius vein. There is no evidence  of deep vein thrombosis of the mid to proximal common  femoral vein. There is no obvious evidence of a superfical thrombosis. There is no evidence of a Baker's cyst. Left: There is no evidence of deep vein thrombosis in the lower extremity.There is no evidence of superficial venous thrombosis. There is no evidence of a Baker's cyst.   TTE 01/17/17: Study Conclusions  - Left ventricle: The cavity size was normal. Wall thickness was normal. Systolic function was normal. The estimated ejection fraction was in the range of 55% to 60%. Wall motion was normal; there were no regional wall motion abnormalities. - Mitral valve: There was mild regurgitation. - Right ventricle: The cavity size was normal. Wall thickness was normal. Systolic function was normal. - Tricuspid valve: There was mild regurgitation    Discharge Instructions  Discharge Instructions    Call MD for:  difficulty breathing, headache or visual disturbances   Complete by:  As directed    Call MD for:  extreme fatigue   Complete by:  As directed    Call MD for:  persistant dizziness or light-headedness   Complete by:  As directed    Call MD for:  severe uncontrolled pain   Complete by:  As directed    Diet - low sodium heart healthy   Complete by:  As directed    Increase activity slowly   Complete by:  As directed        Medication List    STOP taking these medications   acetaminophen-codeine 300-30 MG tablet Commonly known as:  TYLENOL #3     TAKE these medications   acetaminophen 325 MG tablet Commonly known as:  TYLENOL Take 2 tablets (650 mg total) by mouth every 6 (six) hours as needed for mild pain or moderate pain. What changed:    medication strength  how much to take  reasons to take this   amoxicillin 500 MG tablet Commonly known as:  AMOXIL Take 500-1,000 mg by mouth  See admin instructions. Take 1000mg  first then 500mg  every 6 hour for 7 days   apixaban 5 MG Tabs tablet Commonly known as:  ELIQUIS start with two-5mg  tablets twice daily for 7 days. On day 8, switch to one-5mg  tablet twice daily.   atenolol 50 MG tablet Commonly known as:  TENORMIN Take 50 mg by mouth daily.   atorvastatin 40 MG tablet Commonly known as:  LIPITOR Take 40 mg by mouth daily.   fenofibrate micronized 134 MG capsule Commonly known as:  LOFIBRA Take 134 mg by mouth at bedtime.   gabapentin 100 MG capsule Commonly known as:  NEURONTIN Take 100-200 mg by mouth 3 (three) times daily. 100mg  in the morning and afternoon 200mg  in the evening.   loratadine 10 MG tablet Commonly known as:  CLARITIN Take 10 mg by mouth daily.   methocarbamol 500 MG tablet Commonly known as:  ROBAXIN Take 1 tablet (500 mg total) by mouth every 6 (six) hours as needed for muscle spasms.   nicotine 21 mg/24hr patch Commonly known as:  NICODERM CQ - dosed in mg/24 hours Place 21 mg onto the skin daily.   omeprazole 20 MG capsule Commonly known as:  PRILOSEC Take 20 mg by mouth daily.   Oxcarbazepine 300 MG tablet Commonly known as:  TRILEPTAL Take 600 mg by mouth 2 (two) times daily.   risperiDONE 1 MG tablet Commonly known as:  RISPERDAL Take 2-4 mg by mouth 2 (two) times daily. 2mg  in the morning and  4mg  in the evening   tamsulosin 0.4 MG Caps capsule Commonly known as:  FLOMAX Take 0.4 mg by mouth 2 (two) times daily.      Follow-up Information    Aldona Bar, MD. Schedule an appointment as soon as possible for a visit in 1 week(s).   Specialty:  Internal Medicine Why:  To be seen with repeat labs (CBC & BMP). Contact information: [place] [address] [phone]          Allergies  Allergen Reactions  . Fentanyl Other (See Comments)    Patient developed severe chest wall rigidity following IV dose of Fentanyl for post op pain  management. Reaction occurred on first and second administration of Fentanyl intraop producing significant drop in SaO2 and increased airway pressures.  . Morphine Swelling      Procedures/Studies: Dg Chest 2 View  Result Date: 01/16/2017 CLINICAL DATA:  Acute onset of shortness of breath and productive cough. Sneezing. EXAM: CHEST  2 VIEW COMPARISON:  Chest radiograph performed 02/25/2016 FINDINGS: The lungs are well-aerated and clear. There is no evidence of focal opacification, pleural effusion or pneumothorax. The heart is normal in size; the mediastinal contour is within normal limits. No acute osseous abnormalities are seen. IMPRESSION: No acute cardiopulmonary process seen. Electronically Signed   By: Garald Balding M.D.   On: 01/16/2017 21:57   Ct Angio Chest Pe W And/or Wo Contrast  Result Date: 01/17/2017 CLINICAL DATA:  Status post fall in snow a week ago, with acute onset of shortness of breath and productive cough. Sneezing. EXAM: CT ANGIOGRAPHY CHEST WITH CONTRAST TECHNIQUE: Multidetector CT imaging of the chest was performed using the standard protocol during bolus administration of intravenous contrast. Multiplanar CT image reconstructions and MIPs were obtained to evaluate the vascular anatomy. CONTRAST:  196mL ISOVUE-370 IOPAMIDOL (ISOVUE-370) INJECTION 76% COMPARISON:  Chest radiograph performed 01/16/2017, and CTA of the chest performed 12/19/2014 FINDINGS: Cardiovascular: A large saddle pulmonary embolus is noted, extending into all lobes of both lungs, though only minimally seen at the right upper lobe. The RV/LV ratio is 0.84. The heart remains normal in size. The thoracic aorta is unremarkable. The great vessels are within normal limits. Mediastinum/Nodes: The mediastinum is otherwise unremarkable in appearance. No mediastinal lymphadenopathy is seen. No pericardial effusion is identified. The visualized portions of the thyroid gland are unremarkable. No axillary  lymphadenopathy is seen. Lungs/Pleura: There is a mildly mosaic pattern of parenchymal attenuation within both lungs, without definite evidence of pulmonary infarct at this time. No pleural effusion or pneumothorax is seen. No mass is identified. Upper Abdomen: The visualized portions of the liver and spleen are unremarkable. The visualized portions of the gallbladder, pancreas and adrenal glands are within normal limits. Musculoskeletal: No acute osseous abnormalities are identified. The visualized musculature is unremarkable in appearance. Review of the MIP images confirms the above findings. IMPRESSION: 1. Large saddle pulmonary embolus, extending into all lobes of both lungs. RV/LV ratio is 0.84. 2. Mildly mosaic pattern of parenchymal attenuation within both lungs, without definite evidence of pulmonary infarct at this time. Critical Value/emergent results were called by telephone at the time of interpretation on 01/17/2017 at 3:38 am to [facility] PA, who verbally acknowledged these results. Electronically Signed   By: Garald Balding M.D.   On: 01/17/2017 03:42      Subjective: Patient is anxious to go home. Denies any complaints. Denies chest pain, dyspnea, dizziness or lightheadedness. No pain elsewhere reported either. Ambulated with nursing without  difficulty or complaints and saturated at 99% on room air.  Discharge Exam:  Vitals:   01/18/17 0557 01/18/17 1148 01/18/17 1947 01/19/17 0619  BP: 96/67 (!) 99/53 119/74 107/78  Pulse: 72 69 72 70  Resp: 18 20 18 18   Temp: (!) 97.3 F (36.3 C) 98.5 F (36.9 C) 98.3 F (36.8 C) 98.1 F (36.7 C)  TempSrc: Oral Oral Oral Oral  SpO2: 98% 96% 97% 99%  Weight: 81.7 kg (180 lb 1.6 oz)   81.5 kg (179 lb 9.6 oz)  Height:        General exam: Pleasant young male, moderately built and nourished, lying comfortably supine in bed. Respiratory system: Clear to auscultation. Respiratory effort normal. Cardiovascular system: S1 & S2 heard, RRR.  No JVD, murmurs, rubs, gallops or clicks. No pedal edema. Telemetry personally reviewed: Sinus rhythm. Gastrointestinal system: Abdomen is nondistended, soft and nontender. No organomegaly or masses felt. Normal bowel sounds heard. Central nervous system: Alert and oriented. No focal neurological deficits. Extremities: Symmetric 5 x 5 power. Right leg may be marginally, asymmetrically larger than right with mild asymmetric increase in warmth but no other acute findings. Good peripheral pulses felt. Skin: No rashes, lesions or ulcers Psychiatry: Judgement and insight may be slightly impaired. Mood & affect appropriate.  ENT: Poor dental hygiene with multiple caried teeth.       The results of significant diagnostics from this hospitalization (including imaging, microbiology, ancillary and laboratory) are listed below for reference.     Labs: CBC: Recent Labs  Lab 01/16/17 2118 01/18/17 0538 01/19/17 0534  WBC 12.9* 11.8* 11.2*  HGB 12.7* 11.5* 12.5*  HCT 38.2* 35.7* 37.7*  MCV 90.5 92.2 90.4  PLT 523* 595* 175*   Basic Metabolic Panel: Recent Labs  Lab 01/16/17 2118 01/18/17 0538  NA 135 139  K 3.3* 3.9  CL 102 109  CO2 22 22  GLUCOSE 110* 92  BUN 14 11  CREATININE 1.08 0.94  CALCIUM 9.3 9.3   CBG: Recent Labs  Lab 01/18/17 1109 01/18/17 2103  GLUCAP 106* 100*   Urinalysis    Component Value Date/Time   COLORURINE YELLOW 01/17/2017 0600   APPEARANCEUR CLEAR 01/17/2017 0600   LABSPEC >1.046 (H) 01/17/2017 0600   PHURINE 5.0 01/17/2017 0600   GLUCOSEU NEGATIVE 01/17/2017 0600   HGBUR NEGATIVE 01/17/2017 0600   BILIRUBINUR NEGATIVE 01/17/2017 0600   KETONESUR NEGATIVE 01/17/2017 0600   PROTEINUR NEGATIVE 01/17/2017 0600   UROBILINOGEN 0.2 10/26/2011 0126   NITRITE NEGATIVE 01/17/2017 0600   LEUKOCYTESUR NEGATIVE 01/17/2017 0600    I discussed in detail with patient's father this morning. Updated care and answered all questions.  Time coordinating  discharge: Over 30 minutes  SIGNED:  Vernell Leep, MD, FACP, [facility]. [facility] Pager [phone]  If 7PM-7AM, please contact night-coverage [email or web address] Password [email or web address] 01/19/2017, 1:11 PM

## 2017-01-19 NOTE — Discharge Instructions (Signed)

## 2017-01-19 NOTE — Progress Notes (Signed)
Pt got discharged to home, discharge instructions provided and patient showed understanding to it, IV taken out,Telemonitor DC,pt left unit in wheelchair with all of the belongings accompanied with a family member (Parents), Discharge instructions reviewed with the parents too, all questions answered being asked.  Palma Holter, RN

## 2017-01-19 NOTE — Progress Notes (Signed)
[facility] for Heparin  Indication: pulmonary embolus, saddle   Allergies  Allergen Reactions  . Fentanyl Other (See Comments)    Patient developed severe chest wall rigidity following IV dose of Fentanyl for post op pain management. Reaction occurred on first and second administration of Fentanyl intraop producing significant drop in SaO2 and increased airway pressures.  . Morphine Swelling   Patient Measurements: Height: 5\' 9"  (175.3 cm) Weight: 179 lb 9.6 oz (81.5 kg) IBW/kg (Calculated) : 70.7  Vital Signs: Temp: 98.1 F (36.7 C) (12/25 0619) Temp Source: Oral (12/25 0619) BP: 107/78 (12/25 0619) Pulse Rate: 70 (12/25 0619)  Labs: Recent Labs    01/16/17 2118  01/18/17 0538 01/18/17 1445 01/18/17 2244 01/19/17 0534  HGB 12.7*  --  11.5*  --   --  12.5*  HCT 38.2*  --  35.7*  --   --  37.7*  PLT 523*  --  595*  --   --  745*  HEPARINUNFRC  --    < > 0.25* 0.25* 0.69 0.67  CREATININE 1.08  --  0.94  --   --   --    < > = values in this interval not displayed.    Estimated Creatinine Clearance: 114.9 mL/min (by C-G formula based on SCr of 0.94 mg/dL).   Medical History: Past Medical History:  Diagnosis Date  . ADD (attention deficit disorder)   . Arthritis   . DVT (deep venous thrombosis) ([place]) 2013  . GERD (gastroesophageal reflux disease)   . Hypertension   . PE (pulmonary embolism) 13   hx    Assessment: 31 y/o M presented to the ED with right knee pain and shortness of breath. Found to have large SADDLE PE and dopplers + for DVT in RLE.Marland Kitchen Pt has hx of DVT/PE but is currently on NO anti-coagulation PTA.  Consulted to start apixaban.   Goal of Therapy:  \Monitor platelets by anticoagulation protocol: Yes   Plan:  -Start apixaban 10 mg twice daily for 7 days, then 5 mg twice daily -Stop heparin at time of first dose of apixaban  Angus Seller, PharmD Pharmacy Resident [phone] 01/19/2017 9:55 AM

## 2017-01-19 NOTE — Progress Notes (Signed)
Pt ambulated in a hallway without any distress and SOB. Pt's post ambulating oxygen saturation was 99% in RA

## 2017-01-20 LAB — GLUCOSE, CAPILLARY: GLUCOSE-CAPILLARY: 85 mg/dL (ref 65–99)

## 2017-03-16 ENCOUNTER — Encounter (HOSPITAL_COMMUNITY): Payer: Self-pay

## 2017-03-16 ENCOUNTER — Emergency Department (HOSPITAL_COMMUNITY)
Admission: EM | Admit: 2017-03-16 | Discharge: 2017-03-17 | Disposition: A | Discharge status: Home / Self Care | Payer: Medicare Other | Attending: Emergency Medicine | Admitting: Emergency Medicine

## 2017-03-16 DIAGNOSIS — Z86711 Personal history of pulmonary embolism: Secondary | ICD-10-CM | POA: Diagnosis not present

## 2017-03-16 DIAGNOSIS — Z87891 Personal history of nicotine dependence: Secondary | ICD-10-CM | POA: Insufficient documentation

## 2017-03-16 DIAGNOSIS — R0789 Other chest pain: Secondary | ICD-10-CM | POA: Diagnosis not present

## 2017-03-16 DIAGNOSIS — R079 Chest pain, unspecified: Secondary | ICD-10-CM

## 2017-03-16 DIAGNOSIS — I10 Essential (primary) hypertension: Secondary | ICD-10-CM | POA: Insufficient documentation

## 2017-03-16 DIAGNOSIS — R0602 Shortness of breath: Secondary | ICD-10-CM | POA: Diagnosis not present

## 2017-03-16 DIAGNOSIS — M79622 Pain in left upper arm: Secondary | ICD-10-CM | POA: Diagnosis present

## 2017-03-16 DIAGNOSIS — Z7901 Long term (current) use of anticoagulants: Secondary | ICD-10-CM | POA: Diagnosis not present

## 2017-03-16 DIAGNOSIS — Z79899 Other long term (current) drug therapy: Secondary | ICD-10-CM | POA: Diagnosis not present

## 2017-03-16 NOTE — ED Provider Notes (Signed)
[facility] EMERGENCY DEPARTMENT Provider Note   CSN: [id number] Arrival date & time: 03/16/17  2233     History   Chief Complaint Chief Complaint  Patient presents with  . Leg Pain  . Arm Pain    HPI Andre Cervantes is a 32 y.o. male.  Patient with PMH of DVT, PE, and HTN, presents to the ED with a chief complaint of left arm pain and left leg pain.  He reports that the symptoms started about a week ago.  He states that he takes gabapentin for the pain.  He has been compliant in taking his Eliquis for PE/DVT.  He denies any fever, chills, or cough.  He states that he has had some slight  CP and SOB today.  He denies any other associated symptoms.  There are no aggravating or alleviating factors.   The history is provided by the patient, the EMS personnel and a parent. No language interpreter was used.    Past Medical History:  Diagnosis Date  . ADD (attention deficit disorder)   . Arthritis   . DVT (deep venous thrombosis) ([place]) 2013  . GERD (gastroesophageal reflux disease)   . Hypertension   . PE (pulmonary embolism) 13   hx    Patient Active Problem List   Diagnosis Date Noted  . 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 Dental infection 01/17/2017  . Leukocytosis 01/17/2017  . Fusion of lumbar spine   . HCAP (healthcare-associated pneumonia)   . PE (pulmonary embolism)   . Pneumonia   . 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 S/P knee surgery 01/06/2013  . Status post knee surgery 01/05/2013  . Pulmonary emboli ([place]) 10/26/2011  . DVT (deep venous thrombosis) ([place]) 10/26/2011  . HYPERLIPIDEMIA 11/07/2009  . Anxiety state 11/07/2009  . TOBACCO ABUSE 11/07/2009  . Depression 11/07/2009  . Attention deficit hyperactivity disorder (ADHD) 11/07/2009  . GERD 11/07/2009  . OTHER DYSPHAGIA 11/07/2009    Past Surgical History:  Procedure Laterality Date  . FRACTURE SURGERY     rt hand  . KNEE ARTHROSCOPY WITH LATERAL RELEASE  Right 01/05/2013   Procedure: KNEE ARTHROSCOPY WITH DEBRIDEMENT, LATERAL RELEASE, CARTICEL PROCEDURE, OPEN ALLOGRAFT;  Surgeon: Sydnee Cabal, MD;  Location: [facility];  Service: Orthopedics;  Laterality: Right;  . KNEE SURGERY Bilateral 2013    knee  . MEDIAL PATELLOFEMORAL LIGAMENT REPAIR Right 01/05/2013   Procedure: MEDIAL PATELLA FEMORAL LIGAMENT RECONSTRUCTION;  Surgeon: Sydnee Cabal, MD;  Location: [facility];  Service: Orthopedics;  Laterality: Right;  . rt hand little finger pinning  childhood       Home Medications    Prior to Admission medications   Medication Sig Start Date End Date Taking? Authorizing Provider  acetaminophen (TYLENOL) 325 MG tablet Take 2 tablets (650 mg total) by mouth every 6 (six) hours as needed for mild pain or moderate pain. 01/19/17   Hongalgi, Lenis Dickinson, MD  amoxicillin (AMOXIL) 500 MG tablet Take 500-1,000 mg by mouth See admin instructions. Take 1000mg  first then 500mg  every 6 hour for 7 days    [provider]  apixaban (ELIQUIS) 5 MG TABS tablet start with two-5mg  tablets twice daily for 7 days. On day 8, switch to one-5mg  tablet twice daily. 01/18/17   Hongalgi, Lenis Dickinson, MD  atenolol (TENORMIN) 50 MG tablet Take 50 mg by mouth daily.    [provider]  atorvastatin (LIPITOR) 40 MG tablet Take 40 mg by mouth daily.  [provider]  fenofibrate micronized (LOFIBRA) 134 MG capsule Take 134 mg by mouth at bedtime.     [provider]  gabapentin (NEURONTIN) 100 MG capsule Take 100-200 mg by mouth 3 (three) times daily. 100mg  in the morning and afternoon 200mg  in the evening.    [provider]  loratadine (CLARITIN) 10 MG tablet Take 10 mg by mouth daily.    [provider]  methocarbamol (ROBAXIN) 500 MG tablet Take 1 tablet (500 mg total) by mouth every 6 (six) hours as needed for muscle spasms. 01/06/13   Stilwell, Bryson L, PA-C  nicotine (NICODERM CQ - DOSED  IN MG/24 HOURS) 21 mg/24hr patch Place 21 mg onto the skin daily.    [provider]  omeprazole (PRILOSEC) 20 MG capsule Take 20 mg by mouth daily.    [provider]  Oxcarbazepine (TRILEPTAL) 300 MG tablet Take 600 mg by mouth 2 (two) times daily.    [provider]  risperiDONE (RISPERDAL) 1 MG tablet Take 2-4 mg by mouth 2 (two) times daily. 2mg  in the morning and 4mg  in the evening    [provider]  tamsulosin (FLOMAX) 0.4 MG CAPS capsule Take 0.4 mg by mouth 2 (two) times daily.     [provider]    Family History Family History  Problem Relation Age of Onset  . Heart disease Father     Social History Social History   Tobacco Use  . Smoking status: Never Smoker  . Smokeless tobacco: Former Systems developer    Types: Chew  . Tobacco comment: occ chew  Substance Use Topics  . Alcohol use: Yes    Comment: occ beer  . Drug use: Yes    Types: Other-see comments    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     Allergies   Fentanyl and Morphine   Review of Systems Review of Systems  All other systems reviewed and are negative.    Physical Exam Updated Vital Signs Ht 5\' 9"  (1.753 m)   Wt 83.5 kg (184 lb)   SpO2 96%   BMI 27.17 kg/m   Physical Exam  Constitutional: He is oriented to person, place, and time. He appears well-developed and well-nourished.  HENT:  Head: Normocephalic and atraumatic.  Eyes: Conjunctivae and EOM are normal. Pupils are equal, round, and reactive to light. Right eye exhibits no discharge. Left eye exhibits no discharge. No scleral icterus.  Neck: Normal range of motion. Neck supple. No JVD present.  Cardiovascular: Normal rate, regular rhythm, normal heart sounds and intact distal pulses. Exam reveals no gallop and no friction rub.  No murmur heard. Pulmonary/Chest: Effort normal and breath sounds normal. No respiratory distress. He has no wheezes. He has no rales. He exhibits no tenderness.  CTAB    Abdominal: Soft. He exhibits no distension and no mass. There is no tenderness. There is no rebound and no guarding.  Musculoskeletal: Normal range of motion. He exhibits no edema or tenderness.  No calf pain or swelling  Neurological: He is alert and oriented to person, place, and time.  Skin: Skin is warm and dry.  Psychiatric: He has a normal mood and affect. His behavior is normal. Judgment and thought content normal.  Nursing note and vitals reviewed.    ED Treatments / Results  Labs (all labs ordered are listed, but only abnormal results are displayed) Labs Reviewed - No data to display  EKG  EKG Interpretation None  ED ECG REPORT  I personally interpreted this EKG   Date: 03/17/2017   Rate: 57  Rhythm: sinus bradycardia  QRS Axis: normal  Intervals: normal  ST/T Wave abnormalities: normal  Conduction Disutrbances:none  Narrative Interpretation:   Old EKG Reviewed: none available    Radiology Ct Angio Chest Pe W And/or Wo Contrast  Result Date: 03/17/2017 CLINICAL DATA:  Left leg and arm pain with history of DVT and PE EXAM: CT ANGIOGRAPHY CHEST WITH CONTRAST TECHNIQUE: Multidetector CT imaging of the chest was performed using the standard protocol during bolus administration of intravenous contrast. Multiplanar CT image reconstructions and MIPs were obtained to evaluate the vascular anatomy. CONTRAST:  51 mL Isovue 370 intravenous COMPARISON:  CTA 01/17/2017, 12/19/2014 FINDINGS: Cardiovascular: Satisfactory opacification of the pulmonary arteries to the segmental level. The previously noted extensive bilateral emboli have largely resolved. No definite new or acute embolus is seen. Sequela of chronic PE with mild wall thickening in the distal left pulmonary artery and small web like defects within subsegmental branches of the lower lobes. Nonaneurysmal aorta. No dissection is seen. Borderline to mild cardiomegaly. No pericardial effusion Mediastinum/Nodes: No enlarged  mediastinal, hilar, or axillary lymph nodes. Thyroid gland, trachea, and esophagus demonstrate no significant findings. Lungs/Pleura: Lungs are clear. No pleural effusion or pneumothorax. Upper Abdomen: No acute abnormality. Musculoskeletal: No chest wall abnormality. No acute or significant osseous findings. Review of the MIP images confirms the above findings. IMPRESSION: 1. The previously noted extensive bilateral emboli are largely resolved. No definite acute or new embolus is seen. 2. Borderline to mild cardiomegaly 3. Clear lung fields. Electronically Signed   By: Donavan Foil M.D.   On: 03/17/2017 01:41    Procedures Procedures (including critical care time)  Medications Ordered in ED Medications - No data to display   Initial Impression / Assessment and Plan / ED Course  I have reviewed the triage vital signs and the nursing notes.  Pertinent labs & imaging results that were available during my care of the patient were reviewed by me and considered in my medical decision making (see chart for details).    Patient with complaints of left arm and leg pain x 1 week.  Hx of PE/DVT diagnosed in December 2018.  On Eliquis.  Reports compliance.  Takes gabapentin for pain.  Reports slight SOB and CP today.    Patient has a legal guardian, who is on their way to the hospital.  Guardian concerned about worsening PE.  We discussed that with being compliant on the Eliquis it is unlikely, but no impossible.  Guardian would like to proceed with testing.  CT shows resolving PEs.  VSS.  No hypoxia, no tachycardia.   Eliquis appears to be working.  Continue this.  PCP follow-up.  Final Clinical Impressions(s) / ED Diagnoses   Final diagnoses:  Pain of left upper arm  Chest pain, unspecified type    ED Discharge Orders    None       Montine Circle, PA-C 03/17/17 0308    Margette Fast, MD 03/17/17 [phone]

## 2017-03-16 NOTE — ED Triage Notes (Signed)
Pt arrives via EMS with c/o of left leg and arm pain; pt has hx of DVTs; pt has taken gabapentin with no relief; Pt able to ambulate to bed on arrival. Pt a&ox 4 on arrival; pt on blood thinner Eliquis-Andre Cervantes,

## 2017-03-16 NOTE — ED Notes (Signed)
ED Provider at bedside. 

## 2017-03-17 ENCOUNTER — Emergency Department (HOSPITAL_COMMUNITY): Payer: Medicare Other

## 2017-03-17 DIAGNOSIS — M79622 Pain in left upper arm: Secondary | ICD-10-CM | POA: Diagnosis not present

## 2017-03-17 LAB — CBC WITH DIFFERENTIAL/PLATELET
BASOS ABS: 0.1 10*3/uL (ref 0.0–0.1)
BASOS PCT: 0 %
Eosinophils Absolute: 0.3 10*3/uL (ref 0.0–0.7)
Eosinophils Relative: 2 %
HEMATOCRIT: 36.5 % — AB (ref 39.0–52.0)
HEMOGLOBIN: 12.6 g/dL — AB (ref 13.0–17.0)
LYMPHS PCT: 44 %
Lymphs Abs: 5.3 10*3/uL — ABNORMAL HIGH (ref 0.7–4.0)
MCH: 30.2 pg (ref 26.0–34.0)
MCHC: 34.5 g/dL (ref 30.0–36.0)
MCV: 87.5 fL (ref 78.0–100.0)
MONO ABS: 1 10*3/uL (ref 0.1–1.0)
MONOS PCT: 8 %
Neutro Abs: 5.6 10*3/uL (ref 1.7–7.7)
Neutrophils Relative %: 46 %
Platelets: 454 10*3/uL — ABNORMAL HIGH (ref 150–400)
RBC: 4.17 MIL/uL — ABNORMAL LOW (ref 4.22–5.81)
RDW: 12.7 % (ref 11.5–15.5)
WBC: 12.2 10*3/uL — AB (ref 4.0–10.5)

## 2017-03-17 LAB — BASIC METABOLIC PANEL
ANION GAP: 10 (ref 5–15)
BUN: 11 mg/dL (ref 6–20)
CHLORIDE: 98 mmol/L — AB (ref 101–111)
CO2: 21 mmol/L — ABNORMAL LOW (ref 22–32)
Calcium: 9.3 mg/dL (ref 8.9–10.3)
Creatinine, Ser: 0.96 mg/dL (ref 0.61–1.24)
GFR calc non Af Amer: >60 mL/min (ref >60)
GLUCOSE: 97 mg/dL (ref 65–99)
Potassium: 3.7 mmol/L (ref 3.5–5.1)
Sodium: 129 mmol/L — ABNORMAL LOW (ref 135–145)

## 2017-03-17 LAB — I-STAT TROPONIN, ED: Troponin i, poc: 0 ng/mL (ref 0.00–0.08)

## 2017-03-17 MED ORDER — IOPAMIDOL (ISOVUE-370) INJECTION 76%
INTRAVENOUS | Status: AC
  Administered 2017-03-17: 100 mL
  Filled 2017-03-17: qty 100

## 2017-03-17 MED ORDER — SODIUM CHLORIDE 0.9 % IV BOLUS (SEPSIS)
1000.0000 mL | Freq: Once | INTRAVENOUS | Status: AC
  Administered 2017-03-17: 1000 mL via INTRAVENOUS

## 2017-03-17 NOTE — ED Notes (Signed)
Pt departed in NAD, refused use of wheelchair.  

## 2017-03-17 NOTE — ED Notes (Signed)
Patient transported to CT 

## 2017-05-19 DIAGNOSIS — M25562 Pain in left knee: Secondary | ICD-10-CM

## 2017-05-19 HISTORY — DX: Pain in left knee: M25.562

## 2017-06-29 DIAGNOSIS — D75839 Thrombocytosis, unspecified: Secondary | ICD-10-CM | POA: Insufficient documentation

## 2017-06-29 DIAGNOSIS — D473 Essential (hemorrhagic) thrombocythemia: Secondary | ICD-10-CM | POA: Insufficient documentation

## 2017-06-29 HISTORY — DX: Thrombocytosis, unspecified: D75.839

## 2017-07-05 DIAGNOSIS — Z981 Arthrodesis status: Secondary | ICD-10-CM

## 2017-07-05 DIAGNOSIS — M5440 Lumbago with sciatica, unspecified side: Secondary | ICD-10-CM

## 2017-07-05 HISTORY — DX: Arthrodesis status: Z98.1

## 2017-07-05 HISTORY — DX: Lumbago with sciatica, unspecified side: M54.40

## 2017-08-16 ENCOUNTER — Ambulatory Visit (INDEPENDENT_AMBULATORY_CARE_PROVIDER_SITE_OTHER): Payer: Medicare Other | Admitting: Cardiology

## 2017-08-16 ENCOUNTER — Encounter: Payer: Self-pay | Admitting: Cardiology

## 2017-08-16 DIAGNOSIS — Z86711 Personal history of pulmonary embolism: Secondary | ICD-10-CM

## 2017-08-16 DIAGNOSIS — R0789 Other chest pain: Secondary | ICD-10-CM

## 2017-08-16 HISTORY — DX: Other chest pain: R07.89

## 2017-08-16 HISTORY — DX: Personal history of pulmonary embolism: Z86.711

## 2017-08-16 NOTE — Patient Instructions (Signed)
Medication Instructions:  Your physician recommends that you continue on your current medications as directed. Please refer to the Current Medication list given to you today.  Labwork: None  Testing/Procedures: Your physician has requested that you have a stress echocardiogram. For further information please visit [email or web address]. Please follow instruction sheet as given.  Follow-Up: Your physician recommends that you schedule a follow-up appointment in: 6 months  Any Other Special Instructions Will Be Listed Below (If Applicable).     If you need a refill on your cardiac medications before your next appointment, please call your pharmacy.   [place], RN, BSN  Cardiopulmonary Exercise Stress Test Cardiopulmonary exercise testing (CPET) is a test that checks how your heart and lungs react to exercise. This is called your exercise capacity. During this test, you will walk or run on a treadmill or pedal on a stationary bike while tests are done on your heart and lungs. You may have this test to:  See why you are short of breath.  Check for exercise intolerance.  See how your lungs work.  See how your heart works.  Check for how you are responding to a heart or lung rehabilitation program.  See if you have a heart or lung problem.  See if you are healthy enough to have surgery.  What happens before the procedure?  Follow instructions from your doctor about what you cannot eat or drink.  Ask your doctor about changing or stopping your normal medicines. This is important if you take diabetes medicines or blood thinners.  Wear loose, comfortable clothing and shoes.  If you use an inhaler, bring it with you to the test. What happens during the procedure?  A blood pressure cuff will be placed on your arm.  Several stick-on patches (electrodes) will be placed on your chest. They will be attached to an electrocardiogram (EKG) machine.  A clip-on  monitor that measures the amount of oxygen in your blood will be placed on your finger (pulse oximeter).  A clip will be placed on your nose and a mouthpiece will be placed in your mouth. This may be held in place with a headpiece. You will breathe through the mouthpiece during the test.  You will be asked to start exercising. You will be closely watched while you exercise.  The amount of effort for your exercise will be gradually increased.  During exercise, the test will measure: ? Your heart rate. ? Your heart rhythm. ? Your oxygen blood level. ? The amount of oxygen and carbon dioxide that you breathe out.  The test will end when: ? You have finished the test. ? You have reached your maximum ability to exercise. ? You have chest or leg pain, dizziness, or shortness of breath. The procedure may vary among doctors and hospitals. What happens after the procedure?  Your blood pressure and EKG will be checked to watch how you recover from the test. This information is not intended to replace advice given to you by your health care provider. Make sure you discuss any questions you have with your health care provider. Document Released: 12/31/2008 Document Revised: 06/04/2015 Document Reviewed: 11/26/2014 Elsevier Interactive Patient Education  2018 [employer].

## 2017-08-16 NOTE — Progress Notes (Signed)
Cardiology Office Note:    Date:  08/16/2017   ID:  Andre Cervantes, DOB August 25, 1985, MRN [id number]  PCP:  Aldona Bar, MD  Cardiologist:  Jenean Lindau, MD   Referring MD: Aldona Bar, MD    ASSESSMENT:    1. Chest tightness   2. History of pulmonary embolism    PLAN:    In order of problems listed above:  1. Primary prevention stressed with the patient.  Importance of compliance with diet and medications stressed.  The patient was advised to continue his anticoagulation.  His symptoms are concerning and therefore we will do an exercise stress echo.  He knows to go to the nearest emergency room for any concerning symptoms. 2. Patient will be seen in follow-up appointment in 6 months or earlier if the patient has any concerns    Medication Adjustments/Labs and Tests Ordered: Current medicines are reviewed at length with the patient today.  Concerns regarding medicines are outlined above.  No orders of the defined types were placed in this encounter.  No orders of the defined types were placed in this encounter.    History of Present Illness:    Andre Cervantes is a 32 y.o. male who is being seen today for the evaluation of chest tightness at the request of Aldona Bar, MD.  Patient is a pleasant 32 year old male.  He has past medical history of DVT and pulmonary embolism and essential hypertension.  He mentions to me that when he exerts he has chest tightness and this blurs his vision at times.  He is on anticoagulation for history of DVT and PE.  No orthopnea or PND.  He denies any smoking but has used tobacco for chewing in the past.  He does not do that anymore.  His parents accompany him for this visit.  He says he is on disability.  At the time of my evaluation, the patient is alert awake oriented and in no distress.  Past Medical History:  Diagnosis Date  . ADD (attention deficit disorder)   . Arthritis   . DVT (deep venous thrombosis) ([place]) 2013  .  GERD (gastroesophageal reflux disease)   . Hypertension   . PE (pulmonary embolism) 13   hx    Past Surgical History:  Procedure Laterality Date  . FRACTURE SURGERY     rt hand  . KNEE ARTHROSCOPY WITH LATERAL RELEASE Right 01/05/2013   Procedure: KNEE ARTHROSCOPY WITH DEBRIDEMENT, LATERAL RELEASE, CARTICEL PROCEDURE, OPEN ALLOGRAFT;  Surgeon: Sydnee Cabal, MD;  Location: [facility];  Service: Orthopedics;  Laterality: Right;  . KNEE SURGERY Bilateral 2013    knee  . MEDIAL PATELLOFEMORAL LIGAMENT REPAIR Right 01/05/2013   Procedure: MEDIAL PATELLA FEMORAL LIGAMENT RECONSTRUCTION;  Surgeon: Sydnee Cabal, MD;  Location: [facility];  Service: Orthopedics;  Laterality: Right;  . rt hand little finger pinning  childhood    Current Medications: Current Meds  Medication Sig  . acetaminophen (TYLENOL) 325 MG tablet Take 2 tablets (650 mg total) by mouth every 6 (six) hours as needed for mild pain or moderate pain.  Marland Kitchen apixaban (ELIQUIS) 5 MG TABS tablet start with two-5mg  tablets twice daily for 7 days. On day 8, switch to one-5mg  tablet twice daily. (Patient taking differently: Take 10 mg by mouth 2 (two) times daily. )  . atenolol (TENORMIN) 50 MG tablet Take 50 mg by mouth daily.  Marland Kitchen atorvastatin (LIPITOR) 40 MG tablet Take 40 mg by mouth daily.  Marland Kitchen  benztropine (COGENTIN) 0.5 MG tablet Take 0.5 mg by mouth daily.  . fenofibrate micronized (LOFIBRA) 134 MG capsule Take 134 mg by mouth at bedtime.   . gabapentin (NEURONTIN) 100 MG capsule Take 100-200 mg by mouth 3 (three) times daily. 100mg  in the morning and afternoon 200mg  in the evening.  . loratadine (CLARITIN) 10 MG tablet Take 10 mg by mouth daily.  Marland Kitchen omeprazole (PRILOSEC) 20 MG capsule Take 20 mg by mouth daily.  . Oxcarbazepine (TRILEPTAL) 300 MG tablet Take 600 mg by mouth 2 (two) times daily.  . risperiDONE (RISPERDAL) 2 MG tablet Take 2-4 mg by mouth 2 (two) times daily. 2mg  in the morning and  4mg  in the evening  . tamsulosin (FLOMAX) 0.4 MG CAPS capsule Take 0.4 mg by mouth 2 (two) times daily.   . [DISCONTINUED] risperidone (RISPERDAL) 4 MG tablet Take 2 mg by mouth at bedtime.      Allergies:   Fentanyl and Morphine   Social History   Socioeconomic History  . Marital status: Single    Spouse name: Not on file  . Number of children: Not on file  . Years of education: Not on file  . Highest education level: Not on file  Occupational History  . Not on file  Social Needs  . Financial resource strain: Not on file  . Food insecurity:    Worry: Not on file    Inability: Not on file  . Transportation needs:    Medical: Not on file    Non-medical: Not on file  Tobacco Use  . Smoking status: Never Smoker  . Smokeless tobacco: Former Systems developer    Types: Chew  . Tobacco comment: occ chew  Substance and Sexual Activity  . Alcohol use: Yes    Comment: occ beer  . 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 Sexual activity: Not on file  Lifestyle  . Physical activity:    Days per week: Not on file    Minutes per session: Not on file  . Stress: Not on file  Relationships  . Social connections:    Talks on phone: Not on file    Gets together: Not on file    Attends religious service: Not on file    Active member of club or organization: Not on file    Attends meetings of clubs or organizations: Not on file    Relationship status: Not on file  Other Topics Concern  . Not on file  Social History Narrative  . Not on file     Family History: The patient's family history includes Heart disease in his father.  ROS:   Please see the history of present illness.    All other systems reviewed and are negative.  EKGs/Labs/Other Studies Reviewed:    The following studies were reviewed today: EKG reveals sinus rhythm and nonspecific ST-T changes.   Recent Labs: 03/16/2017: BUN 11; Creatinine, Ser 0.96; Hemoglobin 12.6;  Platelets 454; Potassium 3.7; Sodium 129  Recent Lipid Panel No results found for: CHOL, TRIG, HDL, CHOLHDL, VLDL, LDLCALC, LDLDIRECT  Physical Exam:    VS:  BP 104/62 (BP Location: Right Arm, Patient Position: Sitting, Cuff Size: Normal)   Pulse (!) 57   Ht 5\' 9"  (1.753 m)   Wt 186 lb 3.2 oz (84.5 kg)   SpO2 98%   BMI 27.50 kg/m     Wt Readings from Last 3 Encounters:  08/16/17 186 lb 3.2 oz (84.5 kg)  03/16/17 184 lb (83.5 kg)  01/19/17 179 lb 9.6 oz (81.5 kg)     GEN: Patient is in no acute distress HEENT: Normal NECK: No JVD; No carotid bruits LYMPHATICS: No lymphadenopathy CARDIAC: S1 S2 regular, 2/6 systolic murmur at the apex. RESPIRATORY:  Clear to auscultation without rales, wheezing or rhonchi  ABDOMEN: Soft, non-tender, non-distended MUSCULOSKELETAL:  No edema; No deformity  SKIN: Warm and dry NEUROLOGIC:  Alert and oriented x 3 PSYCHIATRIC:  Normal affect    Signed, Jenean Lindau, MD  08/16/2017 4:33 PM    [facility]

## 2017-08-19 ENCOUNTER — Telehealth: Payer: Self-pay | Admitting: Internal Medicine

## 2017-08-19 ENCOUNTER — Emergency Department (HOSPITAL_COMMUNITY): Payer: Medicare Other

## 2017-08-19 ENCOUNTER — Emergency Department (HOSPITAL_COMMUNITY)
Admission: EM | Admit: 2017-08-19 | Discharge: 2017-08-20 | Disposition: A | Discharge status: Home / Self Care | Payer: Medicare Other | Attending: Emergency Medicine | Admitting: Emergency Medicine

## 2017-08-19 ENCOUNTER — Other Ambulatory Visit: Payer: Self-pay

## 2017-08-19 ENCOUNTER — Encounter (HOSPITAL_COMMUNITY): Payer: Self-pay | Admitting: *Deleted

## 2017-08-19 DIAGNOSIS — R079 Chest pain, unspecified: Secondary | ICD-10-CM | POA: Diagnosis present

## 2017-08-19 DIAGNOSIS — E871 Hypo-osmolality and hyponatremia: Secondary | ICD-10-CM | POA: Diagnosis not present

## 2017-08-19 DIAGNOSIS — Z7901 Long term (current) use of anticoagulants: Secondary | ICD-10-CM | POA: Insufficient documentation

## 2017-08-19 DIAGNOSIS — Z87891 Personal history of nicotine dependence: Secondary | ICD-10-CM | POA: Insufficient documentation

## 2017-08-19 DIAGNOSIS — I10 Essential (primary) hypertension: Secondary | ICD-10-CM | POA: Insufficient documentation

## 2017-08-19 DIAGNOSIS — Z79899 Other long term (current) drug therapy: Secondary | ICD-10-CM | POA: Insufficient documentation

## 2017-08-19 DIAGNOSIS — Z86718 Personal history of other venous thrombosis and embolism: Secondary | ICD-10-CM | POA: Diagnosis not present

## 2017-08-19 NOTE — Telephone Encounter (Signed)
Cardiology [facility] Note  Return page from patient's father.  Patient complaining of ongoing chest and arm pain, lack of appetite, and breathing trouble.  Symptoms have been ongoing for many months now.  Unclear whether her symptoms have acutely worsened tonight.  Patient has known history of DVT and PE.  Is currently on apixaban.  Has outpatient appointment for stress test but not for several more weeks.  Patient's father states he is concerned about his son and is planning on driving him to [facility] ED tonight for evaluation.  Marcie Mowers, MD Cardiology Fellow, PGY-6

## 2017-08-19 NOTE — ED Triage Notes (Signed)
Pt takes Eliquis since Dec 2018 for PE. Started having chest pain today, radiating into his L arm with nausea. Pt has felt fluttering in his chest as well. Reports seeing his cardiologist recently and is supposed to have a stress test scheduled. Denies SOB

## 2017-08-20 DIAGNOSIS — R079 Chest pain, unspecified: Secondary | ICD-10-CM | POA: Diagnosis not present

## 2017-08-20 LAB — URINALYSIS, ROUTINE W REFLEX MICROSCOPIC
BILIRUBIN URINE: NEGATIVE
Glucose, UA: NEGATIVE mg/dL
Hgb urine dipstick: NEGATIVE
KETONES UR: NEGATIVE mg/dL
LEUKOCYTES UA: NEGATIVE
NITRITE: NEGATIVE
PH: 7 (ref 5.0–8.0)
PROTEIN: NEGATIVE mg/dL
Specific Gravity, Urine: 1.013 (ref 1.005–1.030)

## 2017-08-20 LAB — HEPATIC FUNCTION PANEL
ALBUMIN: 4.1 g/dL (ref 3.5–5.0)
ALT: 28 U/L (ref 0–44)
AST: 23 U/L (ref 15–41)
Alkaline Phosphatase: 64 U/L (ref 38–126)
BILIRUBIN TOTAL: 0.5 mg/dL (ref 0.3–1.2)
Bilirubin, Direct: <0.1 mg/dL (ref 0.0–0.2)
TOTAL PROTEIN: 6.5 g/dL (ref 6.5–8.1)

## 2017-08-20 LAB — BASIC METABOLIC PANEL
ANION GAP: 7 (ref 5–15)
BUN: 8 mg/dL (ref 6–20)
CALCIUM: 9.3 mg/dL (ref 8.9–10.3)
CHLORIDE: 98 mmol/L (ref 98–111)
CO2: 23 mmol/L (ref 22–32)
Creatinine, Ser: 0.97 mg/dL (ref 0.61–1.24)
GFR calc non Af Amer: >60 mL/min (ref >60)
GLUCOSE: 104 mg/dL — AB (ref 70–99)
Potassium: 3.7 mmol/L (ref 3.5–5.1)
Sodium: 128 mmol/L — ABNORMAL LOW (ref 135–145)

## 2017-08-20 LAB — CBC
HEMATOCRIT: 37.5 % — AB (ref 39.0–52.0)
HEMOGLOBIN: 12.7 g/dL — AB (ref 13.0–17.0)
MCH: 30.2 pg (ref 26.0–34.0)
MCHC: 33.9 g/dL (ref 30.0–36.0)
MCV: 89.3 fL (ref 78.0–100.0)
Platelets: 532 10*3/uL — ABNORMAL HIGH (ref 150–400)
RBC: 4.2 MIL/uL — AB (ref 4.22–5.81)
RDW: 11.7 % (ref 11.5–15.5)
WBC: 11.3 10*3/uL — ABNORMAL HIGH (ref 4.0–10.5)

## 2017-08-20 LAB — I-STAT TROPONIN, ED
TROPONIN I, POC: 0 ng/mL (ref 0.00–0.08)
TROPONIN I, POC: 0 ng/mL (ref 0.00–0.08)

## 2017-08-20 MED ORDER — ASPIRIN 81 MG PO CHEW
81.0000 mg | CHEWABLE_TABLET | Freq: Once | ORAL | Status: AC
  Administered 2017-08-20: 81 mg via ORAL
  Filled 2017-08-20: qty 1

## 2017-08-20 MED ORDER — NITROGLYCERIN 0.4 MG SL SUBL: 0.4000 mg | SUBLINGUAL_TABLET | SUBLINGUAL | 0 refills | Status: DC | PRN

## 2017-08-20 MED ORDER — ASPIRIN 81 MG PO CHEW: 324.0000 mg | CHEWABLE_TABLET | Freq: Once | ORAL | Status: DC

## 2017-08-20 NOTE — ED Provider Notes (Signed)
[facility] EMERGENCY DEPARTMENT Provider Note   CSN: [id number] Arrival date & time: 08/19/17  2324     History   Chief Complaint Chief Complaint  Patient presents with  . Chest Pain    HPI Andre Cervantes is a 32 y.o. male.  The history is provided by the patient.  Chest Pain    He has history of hypertension, DVT, pulmonary embolism and is anticoagulated on apixaban and comes in because of exertional chest discomfort which has been happening for about a month.  Pain is left-sided and described as a dull pain.  It comes on with exertion and will subside within a few minutes of resting.  Pain does sometimes radiate to the left arm.  There is no associated dyspnea, nausea, diaphoresis.  Symptoms have been stable over time.  He has seen his cardiologist to is scheduling a stress test.  Father is also concerned because he has not had much appetite yesterday and today.  Note he has not had any fever or chills.  He is not complaining of any pain elsewhere.  He is a non-smoker and there is no history of diabetes or hyperlipidemia.  There is a family history of premature coronary atherosclerosis.  Past Medical History:  Diagnosis Date  . ADD (attention deficit disorder)   . Arthritis   . DVT (deep venous thrombosis) ([place]) 2013  . GERD (gastroesophageal reflux disease)   . Hypertension   . PE (pulmonary embolism) 13   hx    Patient Active Problem List   Diagnosis Date Noted  . Chest tightness 08/16/2017  . History of pulmonary embolism 08/16/2017  . Thrombocytosis ([place]) 06/29/2017  . 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 Dental infection 01/17/2017  . Leukocytosis 01/17/2017  . Fusion of lumbar spine   . HCAP (healthcare-associated pneumonia)   . PE (pulmonary embolism)   . Pneumonia   . 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 S/P knee surgery 01/06/2013  . Status post knee surgery 01/05/2013  . Pulmonary emboli ([place]) 10/26/2011  . DVT (deep  venous thrombosis) ([place]) 10/26/2011  . HYPERLIPIDEMIA 11/07/2009  . Anxiety state 11/07/2009  . TOBACCO ABUSE 11/07/2009  . Depression 11/07/2009  . Attention deficit hyperactivity disorder (ADHD) 11/07/2009  . GERD 11/07/2009  . OTHER DYSPHAGIA 11/07/2009    Past Surgical History:  Procedure Laterality Date  . FRACTURE SURGERY     rt hand  . KNEE ARTHROSCOPY WITH LATERAL RELEASE Right 01/05/2013   Procedure: KNEE ARTHROSCOPY WITH DEBRIDEMENT, LATERAL RELEASE, CARTICEL PROCEDURE, OPEN ALLOGRAFT;  Surgeon: Sydnee Cabal, MD;  Location: [facility];  Service: Orthopedics;  Laterality: Right;  . KNEE SURGERY Bilateral 2013    knee  . MEDIAL PATELLOFEMORAL LIGAMENT REPAIR Right 01/05/2013   Procedure: MEDIAL PATELLA FEMORAL LIGAMENT RECONSTRUCTION;  Surgeon: Sydnee Cabal, MD;  Location: [facility];  Service: Orthopedics;  Laterality: Right;  . rt hand little finger pinning  childhood        Home Medications    Prior to Admission medications   Medication Sig Start Date End Date Taking? Authorizing Provider  acetaminophen (TYLENOL) 325 MG tablet Take 2 tablets (650 mg total) by mouth every 6 (six) hours as needed for mild pain or moderate pain. 01/19/17   Hongalgi, Lenis Dickinson, MD  apixaban (ELIQUIS) 5 MG TABS tablet start with two-5mg  tablets twice daily for 7 days. On day 8, switch to one-5mg  tablet twice daily. Patient taking differently: Take 10 mg by mouth  2 (two) times daily.  01/18/17   Hongalgi, Lenis Dickinson, MD  atenolol (TENORMIN) 50 MG tablet Take 50 mg by mouth daily.    [provider]  atorvastatin (LIPITOR) 40 MG tablet Take 40 mg by mouth daily.    [provider]  benztropine (COGENTIN) 0.5 MG tablet Take 0.5 mg by mouth daily.    [provider]  fenofibrate micronized (LOFIBRA) 134 MG capsule Take 134 mg by mouth at bedtime.     [provider]  gabapentin (NEURONTIN) 100 MG capsule Take 100-200 mg by mouth  3 (three) times daily. 100mg  in the morning and afternoon 200mg  in the evening.    [provider]  loratadine (CLARITIN) 10 MG tablet Take 10 mg by mouth daily.    [provider]  omeprazole (PRILOSEC) 20 MG capsule Take 20 mg by mouth daily.    [provider]  Oxcarbazepine (TRILEPTAL) 300 MG tablet Take 600 mg by mouth 2 (two) times daily.    [provider]  risperiDONE (RISPERDAL) 2 MG tablet Take 2-4 mg by mouth 2 (two) times daily. 2mg  in the morning and 4mg  in the evening    [provider]  tamsulosin (FLOMAX) 0.4 MG CAPS capsule Take 0.4 mg by mouth 2 (two) times daily.     [provider]    Family History Family History  Problem Relation Age of Onset  . Heart disease Father     Social History Social History   Tobacco Use  . Smoking status: Never Smoker  . Smokeless tobacco: Former Systems developer    Types: Chew  . Tobacco comment: occ chew  Substance Use Topics  . Alcohol use: Yes    Comment: occ beer  . Drug use: Yes    Types: Other-see comments    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     Allergies   Fentanyl and Morphine   Review of Systems Review of Systems  Cardiovascular: Positive for chest pain.  All other systems reviewed and are negative.    Physical Exam Updated Vital Signs BP 123/81   Pulse (!) 54   Temp 97.6 F (36.4 C)   Resp 19   SpO2 97%   Physical Exam  Nursing note and vitals reviewed.  32 year old male, resting comfortably and in no acute distress. Vital signs are significant for mild bradycardia. Oxygen saturation is 97%, which is normal. Head is normocephalic and atraumatic. PERRLA, EOMI. Oropharynx is clear. Neck is nontender and supple without adenopathy or JVD. Back is nontender and there is no CVA tenderness. Lungs are clear without rales, wheezes, or rhonchi. Chest is nontender. Heart has regular rate and rhythm without murmur. Abdomen is soft, flat, nontender without masses  or hepatosplenomegaly and peristalsis is normoactive. Extremities have no cyanosis or edema, full range of motion is present. Skin is warm and dry without rash. Neurologic: Mental status is normal, cranial nerves are intact, there are no motor or sensory deficits.  ED Treatments / Results  Labs (all labs ordered are listed, but only abnormal results are displayed) Labs Reviewed  BASIC METABOLIC PANEL - Abnormal; Notable for the following components:      Result Value   Sodium 128 (*)    Glucose, Bld 104 (*)    All other components within normal limits  CBC - Abnormal; Notable for the following components:   WBC 11.3 (*)    RBC 4.20 (*)    Hemoglobin 12.7 (*)  HCT 37.5 (*)    Platelets 532 (*)    All other components within normal limits  URINALYSIS, ROUTINE W REFLEX MICROSCOPIC - Abnormal; Notable for the following components:   APPearance CLOUDY (*)    Bacteria, UA RARE (*)    All other components within normal limits  HEPATIC FUNCTION PANEL  I-STAT TROPONIN, ED  I-STAT TROPONIN, ED    EKG EKG Interpretation  Date/Time:  Thursday August 19 2017 23:31:42 EDT Ventricular Rate:  67 PR Interval:  192 QRS Duration: 82 QT Interval:  390 QTC Calculation: 412 R Axis:   2 Text Interpretation:  Normal sinus rhythm Normal ECG When compared with ECG of 03/17/2017, No significant change was found Confirmed by Delora Fuel ([id number]) on 08/20/2017 1:45:25 AM   Radiology Dg Chest 2 View  Result Date: 08/19/2017 CLINICAL DATA:  Chest pain. EXAM: CHEST - 2 VIEW COMPARISON:  Radiograph April 01, 2017. FINDINGS: The heart size and mediastinal contours are within normal limits. Both lungs are clear. No pneumothorax or pleural effusion is noted. The visualized skeletal structures are unremarkable. IMPRESSION: No active cardiopulmonary disease. Electronically Signed   By: Marijo Conception, M.D.   On: 08/19/2017 23:51    Procedures Procedures   Medications Ordered in ED Medications  aspirin  chewable tablet 81 mg (81 mg Oral Given 08/20/17 0725)     Initial Impression / Assessment and Plan / ED Course  I have reviewed the triage vital signs and the nursing notes.  Pertinent labs & imaging results that were available during my care of the patient were reviewed by me and considered in my medical decision making (see chart for details).  Chest discomfort of uncertain cause.  Anorexia of uncertain cause.  Old records are reviewed confirming history of saddle embolus in December, office visit with cardiologist July 22 with stress echocardiogram ordered (Scheduled on August 12).  ECG is normal and initial troponin is normal.  Chest x-ray shows no acute process.  Hyponatremia is noted, but is unchanged from recent value.  Mild anemia is present which is also unchanged.  Thrombocytosis present, also unchanged from recent values.  Will check delta troponin.  I suspect his chest discomfort is elated to his pulmonary embolism.  With recent anorexia, will check hepatic function panel.  We will also check urinalysis.  Heart score is 2, which puts him at low risk for major adverse cardiac events in the next 6 weeks.  Repeat troponin is undetectable.  Hepatic function panel was normal.  Urinalysis is normal.  Patient is advised to take low-dose aspirin daily until he goes for his stress test.  If stress test is negative, aspirin can be discontinued at that point.  He is given prescription for nitroglycerin and advised to return to the ED for any episodes that wake him from sleep, or any episodes that failed to respond to 3 nitroglycerin.  Final Clinical Impressions(s) / ED Diagnoses   Final diagnoses:  Nonspecific chest pain  Hyponatremia  Chronic anticoagulation    ED Discharge Orders        Ordered    nitroGLYCERIN (NITROSTAT) 0.4 MG SL tablet  Every 5 min PRN     62/56/38 9373       Delora Fuel, MD 42/87/68 0745

## 2017-08-20 NOTE — Discharge Instructions (Addendum)
Keep your appointment for the stress test. Until then, take aspirin 81 mg every day. If you ever have pain that is not relieved by three nitroglycerin, or if you are ever awakened by pain, then come to the emergency department immediately.

## 2017-09-06 ENCOUNTER — Ambulatory Visit (INDEPENDENT_AMBULATORY_CARE_PROVIDER_SITE_OTHER): Payer: Medicare Other

## 2017-09-06 DIAGNOSIS — R0789 Other chest pain: Secondary | ICD-10-CM | POA: Diagnosis not present

## 2017-09-06 NOTE — Progress Notes (Signed)
Stress echocardiogram with limited exam has been performed.  [place]

## 2017-10-12 ENCOUNTER — Other Ambulatory Visit: Payer: Self-pay

## 2017-10-12 ENCOUNTER — Telehealth: Payer: Self-pay | Admitting: Cardiology

## 2017-10-12 DIAGNOSIS — R42 Dizziness and giddiness: Secondary | ICD-10-CM

## 2017-10-12 DIAGNOSIS — R002 Palpitations: Secondary | ICD-10-CM

## 2017-10-12 NOTE — Telephone Encounter (Signed)
Wants stress echo results

## 2017-10-12 NOTE — Telephone Encounter (Signed)
Per the father the patient is continuing to have chest pain and had to take a nitro last night. The patient has also been experiencing dizziness and fast heart rate intermittently. The father is hoping that he could be brought in to discuss other options?

## 2017-10-12 NOTE — Telephone Encounter (Signed)
An order for monitor has been placed per Revankar. Informed the mother that he would also need to follow up with PCP.   Please schedule 14 day zio.

## 2017-10-25 ENCOUNTER — Ambulatory Visit (INDEPENDENT_AMBULATORY_CARE_PROVIDER_SITE_OTHER): Payer: Medicare Other

## 2017-10-25 DIAGNOSIS — R42 Dizziness and giddiness: Secondary | ICD-10-CM | POA: Diagnosis not present

## 2017-10-25 DIAGNOSIS — R002 Palpitations: Secondary | ICD-10-CM | POA: Diagnosis not present

## 2017-10-27 ENCOUNTER — Telehealth: Payer: Self-pay | Admitting: Cardiology

## 2017-10-27 NOTE — Telephone Encounter (Signed)
States his monitor fell off and he doesn't know what to do

## 2017-10-27 NOTE — Telephone Encounter (Signed)
Requested that they call the number listed on the zio box.

## 2017-10-27 NOTE — Telephone Encounter (Signed)
FYI

## 2017-12-16 ENCOUNTER — Telehealth: Payer: Self-pay | Admitting: *Deleted

## 2017-12-16 ENCOUNTER — Ambulatory Visit (INDEPENDENT_AMBULATORY_CARE_PROVIDER_SITE_OTHER): Payer: Medicare Other | Admitting: Cardiology

## 2017-12-16 ENCOUNTER — Encounter: Payer: Self-pay | Admitting: Cardiology

## 2017-12-16 VITALS — BP 112/62 | HR 63 | Ht 69.0 in | Wt 179.0 lb

## 2017-12-16 DIAGNOSIS — I2782 Chronic pulmonary embolism: Secondary | ICD-10-CM

## 2017-12-16 DIAGNOSIS — I209 Angina pectoris, unspecified: Secondary | ICD-10-CM

## 2017-12-16 DIAGNOSIS — R0609 Other forms of dyspnea: Secondary | ICD-10-CM | POA: Insufficient documentation

## 2017-12-16 DIAGNOSIS — I2602 Saddle embolus of pulmonary artery with acute cor pulmonale: Secondary | ICD-10-CM

## 2017-12-16 DIAGNOSIS — R0600 Dyspnea, unspecified: Secondary | ICD-10-CM

## 2017-12-16 HISTORY — DX: Other forms of dyspnea: R06.09

## 2017-12-16 HISTORY — DX: Dyspnea, unspecified: R06.00

## 2017-12-16 NOTE — Progress Notes (Signed)
Cardiology Office Note:    Date:  12/16/2017   ID:  Andre Cervantes, DOB 14-Nov-1985, MRN [id number]  PCP:  Aldona Bar, MD  Cardiologist:  Jenean Lindau, MD   Referring MD: Aldona Bar, MD    ASSESSMENT:    1. DOE (dyspnea on exertion)   2. 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 Mention stressed to the patient.  We are in the process of seeing if we can extend his monitoring any other way we can.  He tells me that he had patches that would not stick to his body and we will get in touch with the company. 2. He wants to be reassessed for shortness of breath on exertion in view of family history of coronary artery disease we will schedule him for a CT coronary angiography.  In the interim if he has any significant symptoms he knows to go to the nearest emergency room. 3. Sublingual nitroglycerin prescription was sent, its protocol and 911 protocol explained and the patient vocalized understanding questions were answered to the patient's satisfaction 4. Patient will be seen in follow-up appointment in 6 months or earlier if the patient has any concerns    Medication Adjustments/Labs and Tests Ordered: Current medicines are reviewed at length with the patient today.  Concerns regarding medicines are outlined above.  No orders of the defined types were placed in this encounter.  No orders of the defined types were placed in this encounter.    No chief complaint on file.    History of Present Illness:    Andre Cervantes is a 32 y.o. male.  Patient has history of pulmonary embolism.  He has issues of shortness of breath and blurred vision on exertion.  He underwent CT scanning to rule out pulmonary embolism.  He takes his medications meticulously.  He tells me that his shortness of breath on exertion is concerning to him.  His family is very concerned about history of coronary artery disease  and want him to be evaluated his stress echocardiogram was unremarkable.  Past Medical History:  Diagnosis Date  . ADD (attention deficit disorder)   . Arthritis   . DVT (deep venous thrombosis) ([place]) 2013  . GERD (gastroesophageal reflux disease)   . Hypertension   . PE (pulmonary embolism) 13   hx    Past Surgical History:  Procedure Laterality Date  . FRACTURE SURGERY     rt hand  . KNEE ARTHROSCOPY WITH LATERAL RELEASE Right 01/05/2013   Procedure: KNEE ARTHROSCOPY WITH DEBRIDEMENT, LATERAL RELEASE, CARTICEL PROCEDURE, OPEN ALLOGRAFT;  Surgeon: Sydnee Cabal, MD;  Location: [facility];  Service: Orthopedics;  Laterality: Right;  . KNEE SURGERY Bilateral 2013    knee  . MEDIAL PATELLOFEMORAL LIGAMENT REPAIR Right 01/05/2013   Procedure: MEDIAL PATELLA FEMORAL LIGAMENT RECONSTRUCTION;  Surgeon: Sydnee Cabal, MD;  Location: [facility];  Service: Orthopedics;  Laterality: Right;  . rt hand little finger pinning  childhood    Current Medications: Current Meds  Medication Sig  . acetaminophen (TYLENOL) 325 MG tablet Take 2 tablets (650 mg total) by mouth every 6 (six) hours as needed for mild pain or moderate pain.  Marland Kitchen apixaban (ELIQUIS) 5 MG TABS tablet start with two-5mg  tablets twice daily for 7 days. On day 8, switch to one-5mg  tablet twice daily. (Patient taking differently: Take 5 mg by mouth 2 (two) times  daily. )  . atenolol (TENORMIN) 50 MG tablet Take 50 mg by mouth daily.  Marland Kitchen atorvastatin (LIPITOR) 40 MG tablet Take 40 mg by mouth daily.  . benztropine (COGENTIN) 0.5 MG tablet Take 0.5 mg by mouth daily.  . fenofibrate micronized (LOFIBRA) 134 MG capsule Take 134 mg by mouth at bedtime.   . gabapentin (NEURONTIN) 100 MG capsule Take 100 mg by mouth 4 (four) times daily.   Marland Kitchen loratadine (CLARITIN) 10 MG tablet Take 10 mg by mouth daily.  . nitroGLYCERIN (NITROSTAT) 0.4 MG SL tablet Place 1 tablet (0.4 mg total) under the tongue every 5  (five) minutes as needed for chest pain.  Marland Kitchen omeprazole (PRILOSEC) 20 MG capsule Take 20 mg by mouth daily.  . Oxcarbazepine (TRILEPTAL) 300 MG tablet Take 600 mg by mouth 2 (two) times daily.  . risperiDONE (RISPERDAL) 2 MG tablet Take 2 mg by mouth 2 (two) times daily.   . tamsulosin (FLOMAX) 0.4 MG CAPS capsule Take 0.4 mg by mouth 2 (two) times daily.      Allergies:   Fentanyl and Morphine   Social History   Socioeconomic History  . Marital status: Single    Spouse name: Not on file  . Number of children: Not on file  . Years of education: Not on file  . Highest education level: Not on file  Occupational History  . Not on file  Social Needs  . Financial resource strain: Not on file  . Food insecurity:    Worry: Not on file    Inability: Not on file  . Transportation needs:    Medical: Not on file    Non-medical: Not on file  Tobacco Use  . Smoking status: Never Smoker  . Smokeless tobacco: Former Systems developer    Types: Chew  . Tobacco comment: occ chew  Substance and Sexual Activity  . Alcohol use: Yes    Comment: occ beer  . 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 Sexual activity: Not on file  Lifestyle  . Physical activity:    Days per week: Not on file    Minutes per session: Not on file  . Stress: Not on file  Relationships  . Social connections:    Talks on phone: Not on file    Gets together: Not on file    Attends religious service: Not on file    Active member of club or organization: Not on file    Attends meetings of clubs or organizations: Not on file    Relationship status: Not on file  Other Topics Concern  . Not on file  Social History Narrative  . Not on file     Family History: The patient's family history includes Heart disease in his father.  ROS:   Please see the history of present illness.    All other systems reviewed and are negative.  EKGs/Labs/Other Studies Reviewed:    The following  studies were reviewed today: I discussed the findings of the echo and the stress echo with the patient and event monitoring.  Somehow he could not tolerate the patches on the event monitor and we will try to see if there are any alternatives.   Recent Labs: 08/19/2017: ALT 28; BUN 8; Creatinine, Ser 0.97; Hemoglobin 12.7; Platelets 532; Potassium 3.7; Sodium 128  Recent Lipid Panel No results found for: CHOL, TRIG, HDL, CHOLHDL, VLDL, LDLCALC, LDLDIRECT  Physical Exam:  VS:  BP 112/62 (BP Location: Right Arm, Patient Position: Sitting, Cuff Size: Normal)   Pulse 63   Ht 5\' 9"  (1.753 m)   Wt 179 lb (81.2 kg)   SpO2 98%   BMI 26.43 kg/m     Wt Readings from Last 3 Encounters:  12/16/17 179 lb (81.2 kg)  08/16/17 186 lb 3.2 oz (84.5 kg)  03/16/17 184 lb (83.5 kg)     GEN: Patient is in no acute distress HEENT: Normal NECK: No JVD; No carotid bruits LYMPHATICS: No lymphadenopathy CARDIAC: Hear sounds regular, 2/6 systolic murmur at the apex. RESPIRATORY:  Clear to auscultation without rales, wheezing or rhonchi  ABDOMEN: Soft, non-tender, non-distended MUSCULOSKELETAL:  No edema; No deformity  SKIN: Warm and dry NEUROLOGIC:  Alert and oriented x 3 PSYCHIATRIC:  Normal affect   Signed, Jenean Lindau, MD  12/16/2017 12:02 PM    [facility]

## 2017-12-16 NOTE — Addendum Note (Signed)
Addended by: Mattie Marlin on: 12/16/2017 12:12 PM   Modules accepted: Orders

## 2017-12-16 NOTE — Patient Instructions (Addendum)
Medication Instructions:  Your physician has recommended you make the following change in your medication:   START Nitroglycerin 0.4 mg sublingual (under your tongue) as needed for chest pain. If experiencing chest pain, stop what you are doing and sit down. Take 1 nitroglycerin and wait 5 minutes. If chest pain continues, take another nitroglycerin and wait 5 minutes. If chest pain does not subside, take 1 more nitroglycerin and dial 911. You make take a total of 3 nitroglycerin in a 15 minute time frame.  If you need a refill on your cardiac medications before your next appointment, please call your pharmacy.   Lab work: Your physician recommends that you have the following labs drawn: BMP to be done 1 week before testing, no appointment is needed. You do not need to be fasting.  If you have labs (blood work) drawn today and your tests are completely normal, you will receive your results only by: Marland Kitchen MyChart Message (if you have MyChart) OR . A paper copy in the mail If you have any lab test that is abnormal or we need to change your treatment, we will call you to review the results.  Testing/Procedures:  Please arrive at the [facility] main entrance of [facility] at xx:xx AM (30-45 minutes prior to test start time)  [facility] [address], [place] [address] [phone]  Proceed to the [facility] Radiology Department (First Floor).  Please follow these instructions carefully (unless otherwise directed):  Hold all erectile dysfunction medications at least 48 hours prior to test.  On the Night Before the Test: . Be sure to Drink plenty of water. . Do not consume any caffeinated/decaffeinated beverages or chocolate 12 hours prior to your test. . Do not take any antihistamines 12 hours prior to your test. . If you take Metformin do not take 24 hours prior to test.  On the Day of the Test: . Drink plenty of water. Do not drink any water within one  hour of the test. . Do not eat any food 4 hours prior to the test. . You may take your regular medications prior to the test.  . Take atenolol two hours prior to test. (please take this med with you when you go for the CTA) . HOLD Furosemide/Hydrochlorothiazide morning of the test.       After the Test: . Drink plenty of water. . After receiving IV contrast, you may experience a mild flushed feeling. This is normal. . On occasion, you may experience a mild rash up to 24 hours after the test. This is not dangerous. If this occurs, you can take Benadryl 25 mg and increase your fluid intake. . If you experience trouble breathing, this can be serious. If it is severe call 911 IMMEDIATELY. If it is mild, please call our office. . If you take any of these medications: Glipizide/Metformin, Avandament, Glucavance, please do not take 48 hours after completing test.   Follow-Up: At [facility], you and your health needs are our priority.  As part of our continuing mission to provide you with exceptional heart care, we have created designated Provider Care Teams.  These Care Teams include your primary Cardiologist (physician) and Advanced Practice Providers (APPs -  Physician Assistants and Nurse Practitioners) who all work together to provide you with the care you need, when you need it.  You will need a follow up appointment in 6 months.  Please call our office 2 months in advance to  schedule this appointment.  You may see another member of our [employer] Provider Team in [place]: Jenne Campus, MD . Shirlee More, MD  Any Other Special Instructions Will Be Listed Below (If Applicable).  Nitroglycerin sublingual tablets What is this medicine? NITROGLYCERIN (nye troe GLI ser in) is a type of vasodilator. It relaxes blood vessels, increasing the blood and oxygen supply to your heart. This medicine is used to relieve chest pain caused by angina. It is also used to prevent chest pain before  activities like climbing stairs, going outdoors in cold weather, or sexual activity. This medicine may be used for other purposes; ask your health care provider or pharmacist if you have questions. COMMON BRAND NAME(S): Nitroquick, Nitrostat, Nitrotab What should I tell my health care provider before I take this medicine? They need to know if you have any of these conditions: -anemia -head injury, recent stroke, or bleeding in the brain -liver disease -previous heart attack -an unusual or allergic reaction to nitroglycerin, other medicines, foods, dyes, or preservatives -pregnant or trying to get pregnant -breast-feeding How should I use this medicine? Take this medicine by mouth as needed. At the first sign of an angina attack (chest pain or tightness) place one tablet under your tongue. You can also take this medicine 5 to 10 minutes before an event likely to produce chest pain. Follow the directions on the prescription label. Let the tablet dissolve under the tongue. Do not swallow whole. Replace the dose if you accidentally swallow it. It will help if your mouth is not dry. Saliva around the tablet will help it to dissolve more quickly. Do not eat or drink, smoke or chew tobacco while a tablet is dissolving. If you are not better within 5 minutes after taking ONE dose of nitroglycerin, call 9-1-1 immediately to seek emergency medical care. Do not take more than 3 nitroglycerin tablets over 15 minutes. If you take this medicine often to relieve symptoms of angina, your doctor or health care professional may provide you with different instructions to manage your symptoms. If symptoms do not go away after following these instructions, it is important to call 9-1-1 immediately. Do not take more than 3 nitroglycerin tablets over 15 minutes. Talk to your pediatrician regarding the use of this medicine in children. Special care may be needed. Overdosage: If you think you have taken too much of this  medicine contact a poison control center or emergency room at once. NOTE: This medicine is only for you. Do not share this medicine with others. What if I miss a dose? This does not apply. This medicine is only used as needed. What may interact with this medicine? Do not take this medicine with any of the following medications: -certain migraine medicines like ergotamine and dihydroergotamine (DHE) -medicines used to treat erectile dysfunction like sildenafil, tadalafil, and vardenafil -riociguat This medicine may also interact with the following medications: -alteplase -aspirin -heparin -medicines for high blood pressure -medicines for mental depression -other medicines used to treat angina -phenothiazines like chlorpromazine, mesoridazine, prochlorperazine, thioridazine This list may not describe all possible interactions. Give your health care provider a list of all the medicines, herbs, non-prescription drugs, or dietary supplements you use. Also tell them if you smoke, drink alcohol, or use illegal drugs. Some items may interact with your medicine. What should I watch for while using this medicine? Tell your doctor or health care professional if you feel your medicine is no longer working. Keep this medicine with you at all  times. Sit or lie down when you take your medicine to prevent falling if you feel dizzy or faint after using it. Try to remain calm. This will help you to feel better faster. If you feel dizzy, take several deep breaths and lie down with your feet propped up, or bend forward with your head resting between your knees. You may get drowsy or dizzy. Do not drive, use machinery, or do anything that needs mental alertness until you know how this drug affects you. Do not stand or sit up quickly, especially if you are an older patient. This reduces the risk of dizzy or fainting spells. Alcohol can make you more drowsy and dizzy. Avoid alcoholic drinks. Do not treat yourself for  coughs, colds, or pain while you are taking this medicine without asking your doctor or health care professional for advice. Some ingredients may increase your blood pressure. What side effects may I notice from receiving this medicine? Side effects that you should report to your doctor or health care professional as soon as possible: -blurred vision -dry mouth -skin rash -sweating -the feeling of extreme pressure in the head -unusually weak or tired Side effects that usually do not require medical attention (report to your doctor or health care professional if they continue or are bothersome): -flushing of the face or neck -headache -irregular heartbeat, palpitations -nausea, vomiting This list may not describe all possible side effects. Call your doctor for medical advice about side effects. You may report side effects to FDA at 1-800-FDA-1088. Where should I keep my medicine? Keep out of the reach of children. Store at room temperature between 20 and 25 degrees C (68 and 77 degrees F). Store in [employer]. Protect from light and moisture. Keep tightly closed. Throw away any unused medicine after the expiration date. NOTE: This sheet is a summary. It may not cover all possible information. If you have questions about this medicine, talk to your doctor, pharmacist, or health care provider.  2018 Elsevier/Gold Standard (2012-11-10 17:57:36)

## 2017-12-16 NOTE — Telephone Encounter (Signed)
Left message for pt to call back about monitor.

## 2017-12-20 ENCOUNTER — Telehealth: Payer: Self-pay | Admitting: *Deleted

## 2017-12-20 ENCOUNTER — Other Ambulatory Visit: Payer: Self-pay | Admitting: *Deleted

## 2017-12-20 DIAGNOSIS — R002 Palpitations: Secondary | ICD-10-CM

## 2017-12-20 DIAGNOSIS — R42 Dizziness and giddiness: Secondary | ICD-10-CM

## 2017-12-20 NOTE — Telephone Encounter (Signed)
Pt's step mother called about scheduling a new monitor since other one did not stay on pt 24 hours. I told step mom that I would have a long term monitor shipped to [place] to put on pt from [employer], Karn Pickler took all the ones we had today because they were old. When monitor comes in I will call pt and schedule appt to have it put on.

## 2017-12-20 NOTE — Telephone Encounter (Signed)
Left message on home machine for pt to call back about monitor.

## 2017-12-21 NOTE — Telephone Encounter (Signed)
Thank you for your effort Abbie :)   FYI Dr. Geraldo Pitter.Marland KitchenMarland KitchenMarland Kitchen

## 2017-12-27 NOTE — Telephone Encounter (Signed)
Left message for pt to call back to schedule appt for Long Term Monitor.

## 2018-01-07 ENCOUNTER — Ambulatory Visit (INDEPENDENT_AMBULATORY_CARE_PROVIDER_SITE_OTHER): Payer: Medicare Other

## 2018-01-07 DIAGNOSIS — R002 Palpitations: Secondary | ICD-10-CM

## 2018-01-07 DIAGNOSIS — R42 Dizziness and giddiness: Secondary | ICD-10-CM | POA: Diagnosis not present

## 2018-02-02 NOTE — Addendum Note (Signed)
Addended by: Ashok Norris on: 02/02/2018 03:58 PM   Modules accepted: Orders

## 2018-02-08 NOTE — Addendum Note (Signed)
Addended by: Ashok Norris on: 02/08/2018 10:02 AM   Modules accepted: Orders

## 2018-02-08 NOTE — Addendum Note (Signed)
Addended by: Ashok Norris on: 02/08/2018 09:54 AM   Modules accepted: Orders

## 2018-02-14 ENCOUNTER — Telehealth: Payer: Self-pay

## 2018-02-14 NOTE — Telephone Encounter (Signed)
Called patient and left detailed voice message on patients phone regarding test results. 

## 2018-02-14 NOTE — Telephone Encounter (Signed)
-----   Message from Jenean Lindau, MD sent at 02/11/2018  4:13 PM EST ----- The results of the study is unremarkable. Please inform patient. I will discuss in detail at next appointment. Cc  primary care/referring physician Jenean Lindau, MD 02/11/2018 4:13 PM

## 2018-02-22 LAB — BASIC METABOLIC PANEL
BUN/Creatinine Ratio: 9 (ref 9–20)
BUN: 8 mg/dL (ref 6–20)
CALCIUM: 10 mg/dL (ref 8.7–10.2)
CHLORIDE: 92 mmol/L — AB (ref 96–106)
CO2: 21 mmol/L (ref 20–29)
Creatinine, Ser: 0.9 mg/dL (ref 0.76–1.27)
GFR calc Af Amer: 131 mL/min/{1.73_m2} (ref >59)
GFR calc non Af Amer: 113 mL/min/{1.73_m2} (ref >59)
GLUCOSE: 94 mg/dL (ref 65–99)
POTASSIUM: 4.7 mmol/L (ref 3.5–5.2)
SODIUM: 124 mmol/L — AB (ref 134–144)

## 2018-02-25 ENCOUNTER — Telehealth: Payer: Self-pay | Admitting: Emergency Medicine

## 2018-02-25 ENCOUNTER — Telehealth (HOSPITAL_COMMUNITY): Payer: Self-pay | Admitting: Emergency Medicine

## 2018-02-25 NOTE — Telephone Encounter (Signed)
Per Dr. Geraldo Pitter this patient needs an appointment for preop clearance. Left a message for patient to call back to get scheduled for this.

## 2018-02-25 NOTE — Telephone Encounter (Signed)
Pt returned phone call ;; pt verbalizes understanding of appt date/time, parking situation and where to check in, pre-test NPO status and medications ordered, and verified current allergies; name and call back number provided for further questions should they arise Marchia Bond RN Navigator Cardiac Imaging [facility] [phone] office [phone] cell

## 2018-02-25 NOTE — Telephone Encounter (Signed)
Left message on voicemail with name and callback number Haron Beilke RN Navigator Cardiac Imaging [facility] [phone] Office [phone] Cell  

## 2018-03-01 ENCOUNTER — Ambulatory Visit (HOSPITAL_COMMUNITY): Payer: Medicare Other

## 2018-03-01 ENCOUNTER — Ambulatory Visit (HOSPITAL_COMMUNITY)
Admission: RE | Admit: 2018-03-01 | Discharge: 2018-03-01 | Disposition: A | Discharge status: Home / Self Care | Payer: Medicare HMO | Source: Ambulatory Visit | Attending: Cardiology | Admitting: Cardiology

## 2018-03-01 DIAGNOSIS — I209 Angina pectoris, unspecified: Secondary | ICD-10-CM

## 2018-03-01 IMAGING — CT CT HEART MORP W/ CTA COR W/ SCORE W/ CA W/CM &/OR W/O CM
1 of 10 series · 9 of 20 positions shown, 12 images · IV contrast (APPLIED)
Comparison: 03/17/2017

Addendum:
EXAM:
OVER-READ INTERPRETATION  CT CHEST

The following report is an over-read performed by radiologist Dr.
Kaki Jim [REDACTED] on 03/01/2018. This over-read
does not include interpretation of cardiac or coronary anatomy or
pathology. The coronary CTA interpretation by the cardiologist is
attached.
CLINICAL DATA: Chest pain
Cardiac CTA
MEDICATIONS:
Sub lingual nitro. 4 mg and lopressor 5mg
TECHNIQUE: The patient was scanned on a Siemens Force 192 scanner. Gantry
rotation speed was 250 msecs. Collimation was. 6 mm . A 120 kV
prospective scan was triggered in the ascending thoracic aorta at
140 HU's with full mA between 30-70% of the R-R interval . Average
HR during the scan was 61 bpm. The 3D data set was interpreted on a
dedicated work station using MPR, MIP and VRT modes. A total of 80
cc of contrast was used.
TECHNIQUE: The patient was scanned on a Phillips Force scanner.

[Series 12: multiphase · axial · 0.39mm/px · z∈[+1084,+1180]mm · 9 of 1809 slices shown, 12 images]
[im 181/1809  vessel]
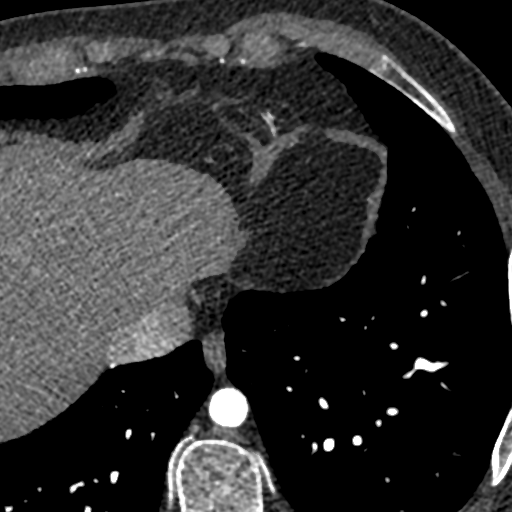
[im 181/1809  lung]
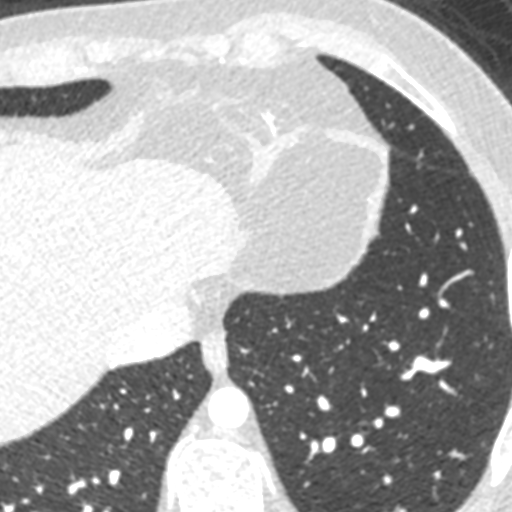
[im 362/1809  vessel]
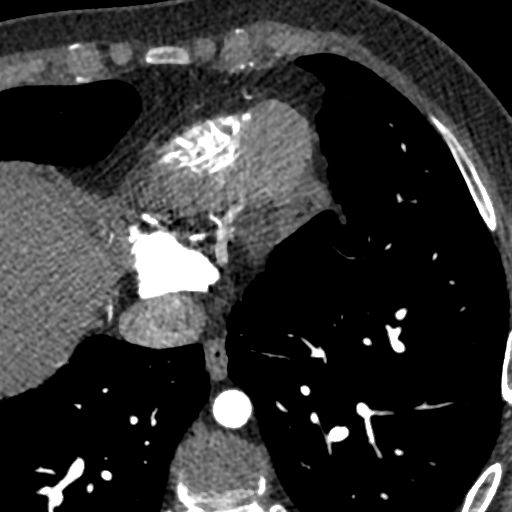
[im 543/1809  vessel]
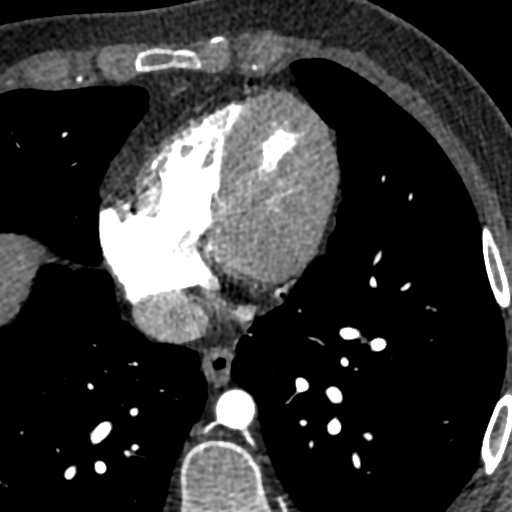
[im 724/1809  vessel]
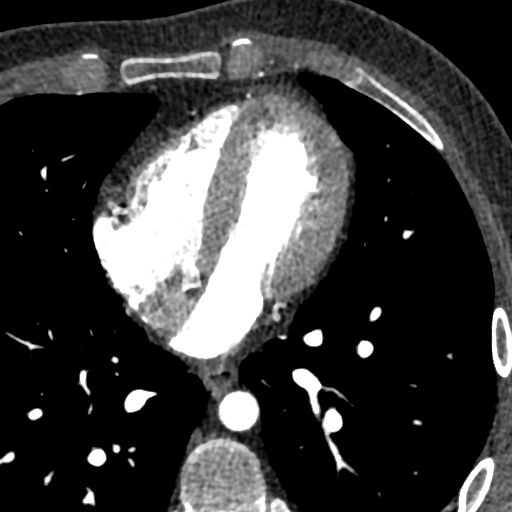
[im 905/1809  vessel]
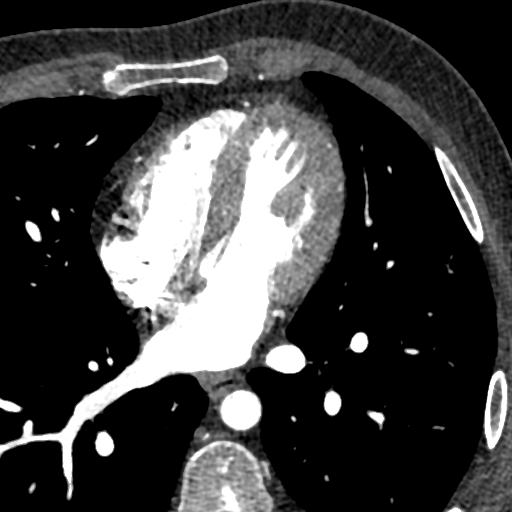
[im 905/1809  lung]
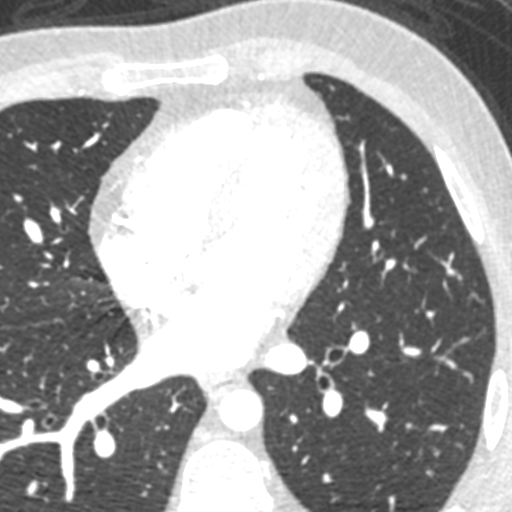
[im 1085/1809  vessel]
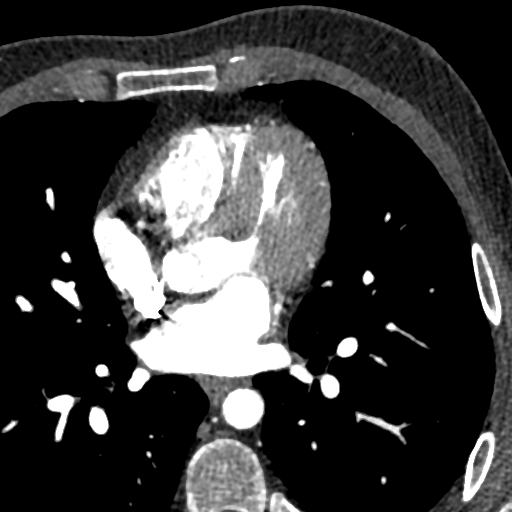
[im 1266/1809  vessel]
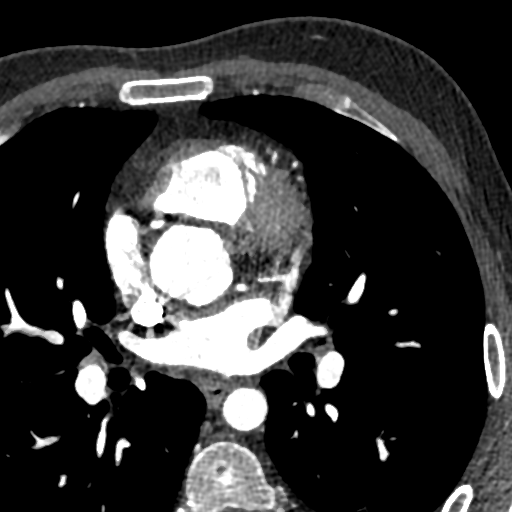
[im 1447/1809  vessel]
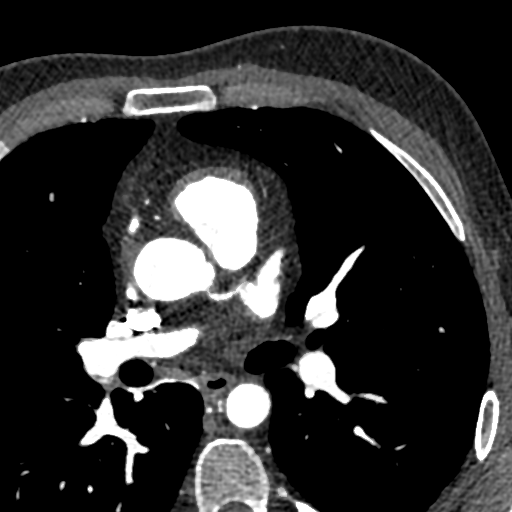
[im 1628/1809  vessel]
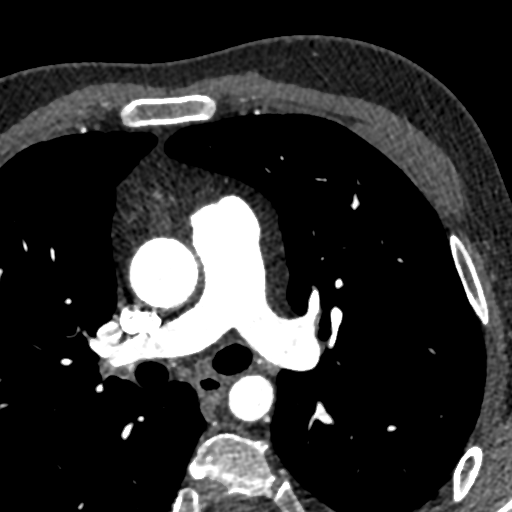
[im 1628/1809  lung]
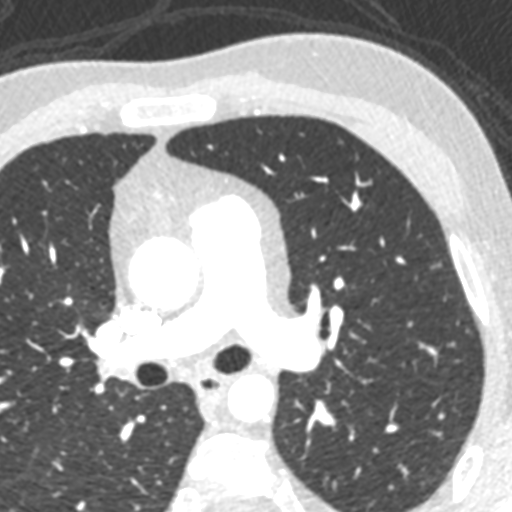

[9 of 20 positions shown; findings below may reference images not displayed]

FINDINGS: Vascular: Heart is normal size.  Aorta is normal caliber.

Mediastinum/Nodes: No adenopathy in the lower mediastinum or hila.

Lungs/Pleura: No confluent opacities or effusions.

Upper Abdomen: Imaging into the upper abdomen shows no acute
findings.

Musculoskeletal: Chest wall soft tissues are unremarkable. No acute
bony abnormality.
IMPRESSION: No acute or significant extracardiac abnormality.
FINDINGS: Non-cardiac: See separate report from [REDACTED]. No
significant findings on limited lung and soft tissue windows.

Calcium score: No calcium noted

Coronary Arteries: Right dominant with no anomalies

LM: Normal

LAD: Normal

D1: Normal

D2: Normal

Circumflex: Normal

OM1: Normal

OM2: Normal

RCA: Normal

PDA: Normal

PLA: Normal
IMPRESSION: 1.  Calcium score 0

2.  Normal right dominant coronary arteries with no anomaly

3.  Normal aortic root 2.7 cm

[REDACTED]AL DATA:  32-year-old male with hypertension, hyperlipidemia
and typical chest pain.

EXAM:
Cardiac/Coronary  CT
FINDINGS: A 120 kV prospective scan was triggered in the descending thoracic
aorta at 111 HU's. Axial non-contrast 3 mm slices were carried out
through the heart. The data set was analyzed on a dedicated work
station and scored using the Agatson method. Gantry rotation speed
was 250 msecs and collimation was .6 mm. No beta blockade and 0.8 mg
of sl NTG was given. The 3D data set was reconstructed in 5%
intervals of the 67-82 % of the R-R cycle. Diastolic phases were
analyzed on a dedicated work station using MPR, MIP and VRT modes.
The patient received 80 cc of contrast.

Aorta:  Normal size.  No calcifications.  No dissection.

Aortic Valve:  Trileaflet.  No calcifications.

Coronary Arteries:  Normal coronary origin.  Right dominance.

RCA is a large dominant artery that gives rise to PDA and PLVB.
There is no plaque.

Left main is a large artery that gives rise to LAD and LCX arteries.

LAD is a large vessel that has gives rise to two diagonal arteries
and has no plaque.

LCX is a non-dominant artery that gives rise to one large OM1
branch. There is no plaque.

Other findings:

Normal pulmonary vein drainage into the left atrium.

Normal let atrial appendage without a thrombus.

Normal size of the pulmonary artery.
IMPRESSION: 1. Coronary calcium score of 0. This was 0 percentile for age and
sex matched control.

2. Normal coronary origin with right dominance.

3. No evidence of CAD.  Consider non-cardiac causes of chest pain.

*** End of Addendum ***

## 2018-03-01 MED ORDER — NITROGLYCERIN 0.4 MG SL SUBL
0.8000 mg | SUBLINGUAL_TABLET | Freq: Once | SUBLINGUAL | Status: AC
  Administered 2018-03-01: 0.8 mg via SUBLINGUAL
  Filled 2018-03-01: qty 25

## 2018-03-01 MED ORDER — IOPAMIDOL (ISOVUE-370) INJECTION 76%
95.0000 mL | Freq: Once | INTRAVENOUS | Status: AC | PRN
  Administered 2018-03-01: 95 mL via INTRAVENOUS

## 2018-03-01 MED ORDER — NITROGLYCERIN 0.4 MG SL SUBL
SUBLINGUAL_TABLET | SUBLINGUAL | Status: AC
  Filled 2018-03-01: qty 2

## 2018-03-01 NOTE — Progress Notes (Signed)
CT complete. Patient denies any pain. Patient given drink.

## 2018-03-01 NOTE — Progress Notes (Signed)
Patient ambulatory out of department with steady gait noted. Family with patient.

## 2018-03-14 ENCOUNTER — Encounter: Payer: Self-pay | Admitting: Cardiology

## 2018-03-14 ENCOUNTER — Ambulatory Visit (INDEPENDENT_AMBULATORY_CARE_PROVIDER_SITE_OTHER): Payer: Medicare HMO | Admitting: Cardiology

## 2018-03-14 VITALS — BP 118/62 | HR 61 | Ht 69.0 in | Wt 181.0 lb

## 2018-03-14 DIAGNOSIS — Z86711 Personal history of pulmonary embolism: Secondary | ICD-10-CM | POA: Diagnosis not present

## 2018-03-14 DIAGNOSIS — R0789 Other chest pain: Secondary | ICD-10-CM | POA: Diagnosis not present

## 2018-03-14 DIAGNOSIS — E871 Hypo-osmolality and hyponatremia: Secondary | ICD-10-CM | POA: Insufficient documentation

## 2018-03-14 HISTORY — DX: Hypo-osmolality and hyponatremia: E87.1

## 2018-03-14 MED ORDER — NITROGLYCERIN 0.4 MG SL SUBL: 0.4000 mg | SUBLINGUAL_TABLET | SUBLINGUAL | 11 refills | Status: DC | PRN

## 2018-03-14 NOTE — Addendum Note (Signed)
Addended by: Orland Penman on: 03/14/2018 04:55 PM   Modules accepted: Orders

## 2018-03-14 NOTE — Addendum Note (Signed)
Addended by: Tarri Glenn on: 03/14/2018 04:39 PM   Modules accepted: Orders

## 2018-03-14 NOTE — Addendum Note (Signed)
Addended by: Orland Penman on: 03/14/2018 04:56 PM   Modules accepted: Orders

## 2018-03-14 NOTE — Progress Notes (Signed)
Cardiology Office Note:    Date:  03/14/2018   ID:  CIRE CLUTE, DOB 1985/06/07, MRN [id number]  PCP:  Aldona Bar, MD  Cardiologist:  Jenean Lindau, MD   Referring MD: Aldona Bar, MD    ASSESSMENT:    1. Chest tightness   2. Hyponatremia   3. History of pulmonary embolism    PLAN:    In order of problems listed above:  1. I discussed my findings with the patient in extensive length.  Primary prevention stressed with the patient.  Importance of compliance with diet and medication stressed and he vocalized understanding.  He was told that his sodium was low in the recent past.  We will check it today.  He has been advised to see his primary care physician and follow with primary care physician for sodium issues.  Since he is on multiple lipid-lowering medications we will do a liver lipid profile tomorrow morning also when he comes back.  This will help [place] give him advice about his lipid-lowering medications especially in view of the fact that his coronaries are within normal months. 2. Patient will be seen in follow-up appointment in 6 months or earlier if the patient has any concerns    Medication Adjustments/Labs and Tests Ordered: Current medicines are reviewed at length with the patient today.  Concerns regarding medicines are outlined above.  No orders of the defined types were placed in this encounter.  No orders of the defined types were placed in this encounter.    No chief complaint on file.    History of Present Illness:    Andre Cervantes is a 33 y.o. male.  Patient was evaluated by me.  He has history of pulmonary embolism.  Fortunately his evaluation for chest tightness was negative he is CT coronary angiography is unremarkable and his calcium score is 0.  He is also here for preop assessment.  He denies any chest pain orthopnea or PND.  At the time of my evaluation, the patient is alert awake oriented and in no distress.  Past Medical History:    Diagnosis Date  . ADD (attention deficit disorder)   . Arthritis   . DVT (deep venous thrombosis) ([place]) 2013  . GERD (gastroesophageal reflux disease)   . Hypertension   . PE (pulmonary embolism) 13   hx    Past Surgical History:  Procedure Laterality Date  . FRACTURE SURGERY     rt hand  . KNEE ARTHROSCOPY WITH LATERAL RELEASE Right 01/05/2013   Procedure: KNEE ARTHROSCOPY WITH DEBRIDEMENT, LATERAL RELEASE, CARTICEL PROCEDURE, OPEN ALLOGRAFT;  Surgeon: Sydnee Cabal, MD;  Location: [facility];  Service: Orthopedics;  Laterality: Right;  . KNEE SURGERY Bilateral 2013    knee  . MEDIAL PATELLOFEMORAL LIGAMENT REPAIR Right 01/05/2013   Procedure: MEDIAL PATELLA FEMORAL LIGAMENT RECONSTRUCTION;  Surgeon: Sydnee Cabal, MD;  Location: [facility];  Service: Orthopedics;  Laterality: Right;  . rt hand little finger pinning  childhood    Current Medications: Current Meds  Medication Sig  . acetaminophen (TYLENOL) 325 MG tablet Take 2 tablets (650 mg total) by mouth every 6 (six) hours as needed for mild pain or moderate pain.  Marland Kitchen apixaban (ELIQUIS) 5 MG TABS tablet start with two-5mg  tablets twice daily for 7 days. On day 8, switch to one-5mg  tablet twice daily. (Patient taking differently: Take 5 mg by mouth 2 (two) times daily. )  . atenolol (TENORMIN) 50 MG tablet Take 50 mg  by mouth daily.  Marland Kitchen atorvastatin (LIPITOR) 40 MG tablet Take 40 mg by mouth daily.  . benztropine (COGENTIN) 0.5 MG tablet Take 0.5 mg by mouth daily.  . fenofibrate micronized (LOFIBRA) 134 MG capsule Take 134 mg by mouth at bedtime.   . gabapentin (NEURONTIN) 100 MG capsule Take 100 mg by mouth 4 (four) times daily.   Marland Kitchen loratadine (CLARITIN) 10 MG tablet Take 10 mg by mouth daily.  . nitroGLYCERIN (NITROSTAT) 0.4 MG SL tablet Place 1 tablet (0.4 mg total) under the tongue every 5 (five) minutes as needed for chest pain.  Marland Kitchen omeprazole (PRILOSEC) 20 MG capsule Take 20 mg by mouth  daily.  . Oxcarbazepine (TRILEPTAL) 300 MG tablet Take 600 mg by mouth 2 (two) times daily.  . risperiDONE (RISPERDAL) 2 MG tablet Take 2 mg by mouth 2 (two) times daily.   . tamsulosin (FLOMAX) 0.4 MG CAPS capsule Take 0.4 mg by mouth 2 (two) times daily.      Allergies:   Fentanyl and Morphine   Social History   Socioeconomic History  . Marital status: Single    Spouse name: Not on file  . Number of children: Not on file  . Years of education: Not on file  . Highest education level: Not on file  Occupational History  . Not on file  Social Needs  . Financial resource strain: Not on file  . Food insecurity:    Worry: Not on file    Inability: Not on file  . Transportation needs:    Medical: Not on file    Non-medical: Not on file  Tobacco Use  . Smoking status: Never Smoker  . Smokeless tobacco: Former Systems developer    Types: Chew  . Tobacco comment: occ chew  Substance and Sexual Activity  . Alcohol use: Yes    Comment: occ beer  . 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 Sexual activity: Not on file  Lifestyle  . Physical activity:    Days per week: Not on file    Minutes per session: Not on file  . Stress: Not on file  Relationships  . Social connections:    Talks on phone: Not on file    Gets together: Not on file    Attends religious service: Not on file    Active member of club or organization: Not on file    Attends meetings of clubs or organizations: Not on file    Relationship status: Not on file  Other Topics Concern  . Not on file  Social History Narrative  . Not on file     Family History: The patient's family history includes Heart disease in his father.  ROS:   Please see the history of present illness.    All other systems reviewed and are negative.  EKGs/Labs/Other Studies Reviewed:    The following studies were reviewed today: CT CORONARY MORPH W/CTA COR W/SCORE W/CA W/CM &/OR WO/CM (Accession  [id number]) (Order [id number])  Imaging  Date: 02/08/2018 Department: [facility] Ordering/Authorizing: Revankar, Reita Cliche, MD  Exam Information   Status Exam Begun  Exam Ended   Final [99] 03/01/2018 2:06 PM 03/01/2018 2:40 PM  PACS Images   Show images for CT CORONARY MORPH W/CTA COR W/SCORE W/CA W/CM &/OR WO/CM  Addendum   ADDENDUM REPORT: 03/01/2018 16:34  CLINICAL DATA:  33 year old male with hypertension, hyperlipidemia and typical chest pain.  EXAM: Cardiac/Coronary  CT  TECHNIQUE: The patient was scanned on a [employer].  FINDINGS: A 120 kV prospective scan was triggered in the descending thoracic aorta at 111 HU's. Axial non-contrast 3 mm slices were carried out through the heart. The data set was analyzed on a dedicated work station and scored using the Elliott. Gantry rotation speed was 250 msecs and collimation was .6 mm. No beta blockade and 0.8 mg of sl NTG was given. The 3D data set was reconstructed in 5% intervals of the 67-82 % of the R-R cycle. Diastolic phases were analyzed on a dedicated work station using MPR, MIP and VRT modes. The patient received 80 cc of contrast.  Aorta:  Normal size.  No calcifications.  No dissection.  Aortic Valve:  Trileaflet.  No calcifications.  Coronary Arteries:  Normal coronary origin.  Right dominance.  RCA is a large dominant artery that gives rise to PDA and PLVB. There is no plaque.  Left main is a large artery that gives rise to LAD and LCX arteries.  LAD is a large vessel that has gives rise to two diagonal arteries and has no plaque.  LCX is a non-dominant artery that gives rise to one large OM1 branch. There is no plaque.  Other findings:  Normal pulmonary vein drainage into the left atrium.  Normal let atrial appendage without a thrombus.  Normal size of the pulmonary artery.  IMPRESSION: 1. Coronary calcium score of 0. This was 0 percentile for age  and sex matched control.  2. Normal coronary origin with right dominance.  3. No evidence of CAD.  Consider non-cardiac causes of chest pain.   Electronically Signed   By: Ena Dawley   On: 03/01/2018 16:34      Recent Labs: 08/19/2017: ALT 28; Hemoglobin 12.7; Platelets 532 02/22/2018: BUN 8; Creatinine, Ser 0.90; Potassium 4.7; Sodium 124  Recent Lipid Panel No results found for: CHOL, TRIG, HDL, CHOLHDL, VLDL, LDLCALC, LDLDIRECT  Physical Exam:    VS:  BP 118/62 (BP Location: Right Arm, Patient Position: Sitting, Cuff Size: Normal)   Pulse 61   Ht 5\' 9"  (1.753 m)   Wt 181 lb (82.1 kg)   SpO2 99%   BMI 26.73 kg/m     Wt Readings from Last 3 Encounters:  03/14/18 181 lb (82.1 kg)  12/16/17 179 lb (81.2 kg)  08/16/17 186 lb 3.2 oz (84.5 kg)     GEN: Patient is in no acute distress HEENT: Normal NECK: No JVD; No carotid bruits LYMPHATICS: No lymphadenopathy CARDIAC: Hear sounds regular, 2/6 systolic murmur at the apex. RESPIRATORY:  Clear to auscultation without rales, wheezing or rhonchi  ABDOMEN: Soft, non-tender, non-distended MUSCULOSKELETAL:  No edema; No deformity  SKIN: Warm and dry NEUROLOGIC:  Alert and oriented x 3 PSYCHIATRIC:  Normal affect   Signed, Jenean Lindau, MD  03/14/2018 2:45 PM    [facility]

## 2018-03-14 NOTE — Patient Instructions (Signed)
Medication Instructions:   Your physician recommends that you continue on your current medications as directed. Please refer to the Current Medication list given to you today.   If you need a refill on your cardiac medications before your next appointment, please call your pharmacy.   Lab work:  Your physician recommends that you return for lab work tomorrow: BMP, LFT, LIPIDS    If you have labs (blood work) drawn today and your tests are completely normal, you will receive your results only by: Marland Kitchen MyChart Message (if you have MyChart) OR . A paper copy in the mail If you have any lab test that is abnormal or we need to change your treatment, we will call you to review the results.  Testing/Procedures:  NONE  Follow-Up: At [facility], you and your health needs are our priority.  As part of our continuing mission to provide you with exceptional heart care, we have created designated Provider Care Teams.  These Care Teams include your primary Cardiologist (physician) and Advanced Practice Providers (APPs -  Physician Assistants and Nurse Practitioners) who all work together to provide you with the care you need, when you need it.  . You will need a follow up appointment in 6 months.  Please call our office 2 months in advance to schedule this appointment.

## 2018-03-16 LAB — LIPID PANEL
CHOL/HDL RATIO: 3.5 ratio (ref 0.0–5.0)
Cholesterol, Total: 117 mg/dL (ref 100–199)
HDL: 33 mg/dL — ABNORMAL LOW (ref >39)
LDL Calculated: 66 mg/dL (ref 0–99)
Triglycerides: 90 mg/dL (ref 0–149)
VLDL Cholesterol Cal: 18 mg/dL (ref 5–40)

## 2018-03-16 LAB — BASIC METABOLIC PANEL
BUN/Creatinine Ratio: 12 (ref 9–20)
BUN: 11 mg/dL (ref 6–20)
CO2: 20 mmol/L (ref 20–29)
Calcium: 9.6 mg/dL (ref 8.7–10.2)
Chloride: 95 mmol/L — ABNORMAL LOW (ref 96–106)
Creatinine, Ser: 0.95 mg/dL (ref 0.76–1.27)
GFR calc Af Amer: 123 mL/min/{1.73_m2} (ref >59)
GFR, EST NON AFRICAN AMERICAN: 106 mL/min/{1.73_m2} (ref >59)
Glucose: 87 mg/dL (ref 65–99)
Potassium: 4.5 mmol/L (ref 3.5–5.2)
Sodium: 128 mmol/L — ABNORMAL LOW (ref 134–144)

## 2018-03-16 LAB — HEPATIC FUNCTION PANEL
ALT: 33 IU/L (ref 0–44)
AST: 26 IU/L (ref 0–40)
Albumin: 4.6 g/dL (ref 4.0–5.0)
Alkaline Phosphatase: 71 IU/L (ref 39–117)
Bilirubin Total: 0.3 mg/dL (ref 0.0–1.2)
Bilirubin, Direct: 0.1 mg/dL (ref 0.00–0.40)
Total Protein: 6.5 g/dL (ref 6.0–8.5)

## 2018-03-17 ENCOUNTER — Telehealth: Payer: Self-pay

## 2018-03-17 NOTE — Telephone Encounter (Signed)
-----   Message from Jenean Lindau, MD sent at 03/17/2018 10:42 AM EST ----- The results of the study is unremarkable. Please inform patient. I will discuss in detail at next appointment. Cc  primary care/referring physician Jenean Lindau, MD 03/17/2018 10:42 AM

## 2018-03-17 NOTE — Telephone Encounter (Signed)
Left message for patient to call back for results on patients home #.

## 2018-03-18 ENCOUNTER — Telehealth: Payer: Self-pay

## 2018-03-18 NOTE — Telephone Encounter (Signed)
-----   Message from Jenean Lindau, MD sent at 03/17/2018 10:42 AM EST ----- The results of the study is unremarkable. Please inform patient. I will discuss in detail at next appointment. Cc  primary care/referring physician Jenean Lindau, MD 03/17/2018 10:42 AM

## 2018-03-18 NOTE — Telephone Encounter (Signed)
RN left message on home phone for patient to call back for results. Copy of results sent to Dr. Eli Hose per Dr. Docia Furl request.

## 2018-03-21 ENCOUNTER — Telehealth: Payer: Self-pay

## 2018-03-21 NOTE — Telephone Encounter (Signed)
Called cell # with no vm setup, unable to leave a message.

## 2018-03-21 NOTE — Telephone Encounter (Signed)
-----   Message from Jenean Lindau, MD sent at 03/17/2018 10:42 AM EST ----- The results of the study is unremarkable. Please inform patient. I will discuss in detail at next appointment. Cc  primary care/referring physician Jenean Lindau, MD 03/17/2018 10:42 AM

## 2018-03-22 ENCOUNTER — Telehealth: Payer: Self-pay

## 2018-03-22 NOTE — Telephone Encounter (Signed)
-----   Message from Jenean Lindau, MD sent at 03/17/2018 10:42 AM EST ----- The results of the study is unremarkable. Please inform patient. I will discuss in detail at next appointment. Cc  primary care/referring physician Jenean Lindau, MD 03/17/2018 10:42 AM

## 2018-03-22 NOTE — Telephone Encounter (Signed)
Left message on patients home phone to call back for results.   

## 2018-03-24 ENCOUNTER — Telehealth: Payer: Self-pay

## 2018-03-24 NOTE — Telephone Encounter (Signed)
Information relayed to patient. No questions at this time. He does not currently have a PCP because he has been going to Emergency clinic on [address]. Copy of results were not forwarded.

## 2018-09-12 ENCOUNTER — Ambulatory Visit: Payer: Medicare HMO | Admitting: Cardiology

## 2018-09-13 ENCOUNTER — Encounter: Payer: Self-pay | Admitting: Cardiology

## 2018-09-13 ENCOUNTER — Other Ambulatory Visit: Payer: Self-pay

## 2018-09-13 ENCOUNTER — Ambulatory Visit (INDEPENDENT_AMBULATORY_CARE_PROVIDER_SITE_OTHER): Payer: Medicare HMO | Admitting: Cardiology

## 2018-09-13 VITALS — BP 98/70 | HR 51 | Ht 69.0 in | Wt 172.0 lb

## 2018-09-13 DIAGNOSIS — R0789 Other chest pain: Secondary | ICD-10-CM | POA: Diagnosis not present

## 2018-09-13 DIAGNOSIS — R079 Chest pain, unspecified: Secondary | ICD-10-CM

## 2018-09-13 DIAGNOSIS — E871 Hypo-osmolality and hyponatremia: Secondary | ICD-10-CM

## 2018-09-13 DIAGNOSIS — Z86711 Personal history of pulmonary embolism: Secondary | ICD-10-CM | POA: Diagnosis not present

## 2018-09-13 MED ORDER — NITROGLYCERIN 0.4 MG SL SUBL: 0.4000 mg | SUBLINGUAL_TABLET | SUBLINGUAL | 11 refills | Status: DC | PRN

## 2018-09-13 NOTE — Addendum Note (Signed)
Addended by: Beckey Rutter on: 09/13/2018 04:06 PM   Modules accepted: Orders

## 2018-09-13 NOTE — Patient Instructions (Signed)
Medication Instructions:  Your physician has recommended you make the following change in your medication: START nitroglycerin as needed for chest pain When having chest pain, stop what you are doing and sit down. Take 1 nitro, wait 5 minutes. Still having chest pain, take 1 nitro, wait 5 minutes. Still having chest pain, take 1 nitro, dial 911. Total of 3 nitro in 15 minutes.   If you need a refill on your cardiac medications before your next appointment, please call your pharmacy.   Lab work: NONE If you have labs (blood work) drawn today and your tests are completely normal, you will receive your results only by: Marland Kitchen MyChart Message (if you have MyChart) OR . A paper copy in the mail If you have any lab test that is abnormal or we need to change your treatment, we will call you to review the results.  Testing/Procedures: Your physician has requested that you have a lexiscan myoview. For further information please visit [email or web address]. Please follow instruction sheet, as given.   Follow-Up: At [facility], you and your health needs are our priority.  As part of our continuing mission to provide you with exceptional heart care, we have created designated Provider Care Teams.  These Care Teams include your primary Cardiologist (physician) and Advanced Practice Providers (APPs -  Physician Assistants and Nurse Practitioners) who all work together to provide you with the care you need, when you need it. You will need a follow up appointment in 6 months.    Any Other Special Instructions Will Be Listed Below Nitroglycerin sublingual tablets What is this medicine? NITROGLYCERIN (nye troe GLI ser in) is a type of vasodilator. It relaxes blood vessels, increasing the blood and oxygen supply to your heart. This medicine is used to relieve chest pain caused by angina. It is also used to prevent chest pain before activities like climbing stairs, going outdoors in cold weather, or sexual  activity. This medicine may be used for other purposes; ask your health care provider or pharmacist if you have questions. COMMON BRAND NAME(S): Nitroquick, Nitrostat, Nitrotab What should I tell my health care provider before I take this medicine? They need to know if you have any of these conditions:  anemia  head injury, recent stroke, or bleeding in the brain  liver disease  previous heart attack  an unusual or allergic reaction to nitroglycerin, other medicines, foods, dyes, or preservatives  pregnant or trying to get pregnant  breast-feeding How should I use this medicine? Take this medicine by mouth as needed. At the first sign of an angina attack (chest pain or tightness) place one tablet under your tongue. You can also take this medicine 5 to 10 minutes before an event likely to produce chest pain. Follow the directions on the prescription label. Let the tablet dissolve under the tongue. Do not swallow whole. Replace the dose if you accidentally swallow it. It will help if your mouth is not dry. Saliva around the tablet will help it to dissolve more quickly. Do not eat or drink, smoke or chew tobacco while a tablet is dissolving. If you are not better within 5 minutes after taking ONE dose of nitroglycerin, call 9-1-1 immediately to seek emergency medical care. Do not take more than 3 nitroglycerin tablets over 15 minutes. If you take this medicine often to relieve symptoms of angina, your doctor or health care professional may provide you with different instructions to manage your symptoms. If symptoms do not go away after following these  instructions, it is important to call 9-1-1 immediately. Do not take more than 3 nitroglycerin tablets over 15 minutes. Talk to your pediatrician regarding the use of this medicine in children. Special care may be needed. Overdosage: If you think you have taken too much of this medicine contact a poison control center or emergency room at once.  NOTE: This medicine is only for you. Do not share this medicine with others. What if I miss a dose? This does not apply. This medicine is only used as needed. What may interact with this medicine? Do not take this medicine with any of the following medications:  certain migraine medicines like ergotamine and dihydroergotamine (DHE)  medicines used to treat erectile dysfunction like sildenafil, tadalafil, and vardenafil  riociguat This medicine may also interact with the following medications:  alteplase  aspirin  heparin  medicines for high blood pressure  medicines for mental depression  other medicines used to treat angina  phenothiazines like chlorpromazine, mesoridazine, prochlorperazine, thioridazine This list may not describe all possible interactions. Give your health care provider a list of all the medicines, herbs, non-prescription drugs, or dietary supplements you use. Also tell them if you smoke, drink alcohol, or use illegal drugs. Some items may interact with your medicine. What should I watch for while using this medicine? Tell your doctor or health care professional if you feel your medicine is no longer working. Keep this medicine with you at all times. Sit or lie down when you take your medicine to prevent falling if you feel dizzy or faint after using it. Try to remain calm. This will help you to feel better faster. If you feel dizzy, take several deep breaths and lie down with your feet propped up, or bend forward with your head resting between your knees. You may get drowsy or dizzy. Do not drive, use machinery, or do anything that needs mental alertness until you know how this drug affects you. Do not stand or sit up quickly, especially if you are an older patient. This reduces the risk of dizzy or fainting spells. Alcohol can make you more drowsy and dizzy. Avoid alcoholic drinks. Do not treat yourself for coughs, colds, or pain while you are taking this medicine  without asking your doctor or health care professional for advice. Some ingredients may increase your blood pressure. What side effects may I notice from receiving this medicine? Side effects that you should report to your doctor or health care professional as soon as possible:  blurred vision  dry mouth  skin rash  sweating  the feeling of extreme pressure in the head  unusually weak or tired Side effects that usually do not require medical attention (report to your doctor or health care professional if they continue or are bothersome):  flushing of the face or neck  headache  irregular heartbeat, palpitations  nausea, vomiting This list may not describe all possible side effects. Call your doctor for medical advice about side effects. You may report side effects to FDA at 1-800-FDA-1088. Where should I keep my medicine? Keep out of the reach of children. Store at room temperature between 20 and 25 degrees C (68 and 77 degrees F). Store in [facility] [employer]. Protect from light and moisture. Keep tightly closed. Throw away any unused medicine after the expiration date. NOTE: This sheet is a summary. It may not cover all possible information. If you have questions about this medicine, talk to your doctor, pharmacist, or health care provider.  2020 Elsevier/Gold  Standard (2012-11-10 17:57:36)  Regadenoson injection What is this medicine? REGADENOSON is used to test the heart for coronary artery disease. It is used in patients who can not exercise for their stress test. This medicine may be used for other purposes; ask your health care provider or pharmacist if you have questions. COMMON BRAND NAME(S): Lexiscan What should I tell my health care provider before I take this medicine? They need to know if you have any of these conditions:  heart problems  lung or breathing disease, like asthma or COPD  an unusual or allergic reaction to regadenoson, other medicines, foods,  dyes, or preservatives  pregnant or trying to get pregnant  breast-feeding How should I use this medicine? This medicine is for injection into a vein. It is given by a health care professional in a hospital or clinic setting. Talk to your pediatrician regarding the use of this medicine in children. Special care may be needed. Overdosage: If you think you have taken too much of this medicine contact a poison control center or emergency room at once. NOTE: This medicine is only for you. Do not share this medicine with others. What if I miss a dose? This does not apply. What may interact with this medicine?  caffeine  dipyridamole  guarana  theophylline This list may not describe all possible interactions. Give your health care provider a list of all the medicines, herbs, non-prescription drugs, or dietary supplements you use. Also tell them if you smoke, drink alcohol, or use illegal drugs. Some items may interact with your medicine. What should I watch for while using this medicine? Your condition will be monitored carefully while you are receiving this medicine. Do not take medicines, foods, or drinks with caffeine (like coffee, tea, or colas) for at least 12 hours before your test. If you do not know if something contains caffeine, ask your health care professional. What side effects may I notice from receiving this medicine? Side effects that you should report to your doctor or health care professional as soon as possible:  allergic reactions like skin rash, itching or hives, swelling of the face, lips, or tongue  breathing problems  chest pain, tightness or palpitations  severe headache Side effects that usually do not require medical attention (report to your doctor or health care professional if they continue or are bothersome):  flushing  headache  irritation or pain at site where injected  nausea, vomiting This list may not describe all possible side effects. Call  your doctor for medical advice about side effects. You may report side effects to FDA at 1-800-FDA-1088. Where should I keep my medicine? This drug is given in a hospital or clinic and will not be stored at home. NOTE: This sheet is a summary. It may not cover all possible information. If you have questions about this medicine, talk to your doctor, pharmacist, or health care provider.  2020 Elsevier/Gold Standard (2007-09-12 15:08:13)  Cardiac Nuclear Scan A cardiac nuclear scan is a test that is done to check the flow of blood to your heart. It is done when you are resting and when you are exercising. The test looks for problems such as:  Not enough blood reaching a portion of the heart.  The heart muscle not working as it should. You may need this test if:  You have heart disease.  You have had lab results that are not normal.  You have had heart surgery or a balloon procedure to open up blocked arteries (  angioplasty).  You have chest pain.  You have shortness of breath. In this test, a special dye (tracer) is put into your bloodstream. The tracer will travel to your heart. A camera will then take pictures of your heart to see how the tracer moves through your heart. This test is usually done at a hospital and takes 2-4 hours. Tell a doctor about:  Any allergies you have.  All medicines you are taking, including vitamins, herbs, eye drops, creams, and over-the-counter medicines.  Any problems you or family members have had with anesthetic medicines.  Any blood disorders you have.  Any surgeries you have had.  Any medical conditions you have.  Whether you are pregnant or may be pregnant. What are the risks? Generally, this is a safe test. However, problems may occur, such as:  Serious chest pain and heart attack. This is only a risk if the stress portion of the test is done.  Rapid heartbeat.  A feeling of warmth in your chest. This feeling usually does not last long.   Allergic reaction to the tracer. What happens before the test?  Ask your doctor about changing or stopping your normal medicines. This is important.  Follow instructions from your doctor about what you cannot eat or drink.  Remove your jewelry on the day of the test. What happens during the test?  An IV tube will be inserted into one of your veins.  Your doctor will give you a small amount of tracer through the IV tube.  You will wait for 20-40 minutes while the tracer moves through your bloodstream.  Your heart will be monitored with an electrocardiogram (ECG).  You will lie down on an exam table.  Pictures of your heart will be taken for about 15-20 minutes.  You may also have a stress test. For this test, one of these things may be done: ? You will be asked to exercise on a treadmill or a stationary bike. ? You will be given medicines that will make your heart work harder. This is done if you are unable to exercise.  When blood flow to your heart has peaked, a tracer will again be given through the IV tube.  After 20-40 minutes, you will get back on the exam table. More pictures will be taken of your heart.  Depending on the tracer that is used, more pictures may need to be taken 3-4 hours later.  Your IV tube will be removed when the test is over. The test may vary among doctors and hospitals. What happens after the test?  Ask your doctor: ? Whether you can return to your normal schedule, including diet, activities, and medicines. ? Whether you should drink more fluids. This will help to remove the tracer from your body. Drink enough fluid to keep your pee (urine) pale yellow.  Ask your doctor, or the department that is doing the test: ? When will my results be ready? ? How will I get my results? Summary  A cardiac nuclear scan is a test that is done to check the flow of blood to your heart.  Tell your doctor whether you are pregnant or may be pregnant.  Before  the test, ask your doctor about changing or stopping your normal medicines. This is important.  Ask your doctor whether you can return to your normal activities. You may be asked to drink more fluids. This information is not intended to replace advice given to you by your health care provider. Make  sure you discuss any questions you have with your health care provider. Document Released: 06/28/2017 Document Revised: 05/04/2018 Document Reviewed: 06/28/2017 Elsevier Patient Education  2020 [employer].

## 2018-09-13 NOTE — Progress Notes (Signed)
Cardiology Office Note:    Date:  09/13/2018   ID:  Andre Cervantes, DOB 01/06/1986, MRN [id number]  PCP:  Aldona Bar, MD  Cardiologist:  Jenean Lindau, MD   Referring MD: Aldona Bar, MD    ASSESSMENT:    1. Hyponatremia   2. Chest tightness   3. History of pulmonary embolism    PLAN:    In order of problems listed above:  1. Chest tightness: The patient and his mother is concerned about the symptoms.  He had chest tightness relieved with nitroglycerin.  In view of this we will do a Lexiscan sestamibi.  I discussed my findings and the test with him at extensive length and he vocalized understanding and are agreeable. 2. Sublingual nitroglycerin prescription was sent, its protocol and 911 protocol explained and the patient vocalized understanding questions were answered to the patient's satisfaction 3. Patient will be seen in follow-up appointment in 6 months or earlier if the patient has any concerns.  He knows to go to the nearest emergency room for any concerning symptoms.   Medication Adjustments/Labs and Tests Ordered: Current medicines are reviewed at length with the patient today.  Concerns regarding medicines are outlined above.  No orders of the defined types were placed in this encounter.  Meds ordered this encounter  Medications   nitroGLYCERIN (NITROSTAT) 0.4 MG SL tablet    Sig: Place 1 tablet (0.4 mg total) under the tongue every 5 (five) minutes as needed for chest pain.    Dispense:  25 tablet    Refill:  11     Chief Complaint  Patient presents with   Follow-up     History of Present Illness:    Andre Cervantes is a 33 y.o. male.  Patient has history of pulmonary embolism and he is taking lifelong anticoagulation per the recommendations of his physicians.  His mother accompanies him for this visit today.  He mentions to me that he had an episode of chest tightness and use nitroglycerin with relief.  No orthopnea or PND.  The mother is  concerned about the symptoms.  At the time of my evaluation, the patient is alert awake oriented and in no distress.  Past Medical History:  Diagnosis Date   ADD (attention deficit disorder)    Arthritis    DVT (deep venous thrombosis) ([place]) 2013   GERD (gastroesophageal reflux disease)    Hypertension    PE (pulmonary embolism) 13   hx    Past Surgical History:  Procedure Laterality Date   FRACTURE SURGERY     rt hand   KNEE ARTHROSCOPY WITH LATERAL RELEASE Right 01/05/2013   Procedure: KNEE ARTHROSCOPY WITH DEBRIDEMENT, LATERAL RELEASE, CARTICEL PROCEDURE, OPEN ALLOGRAFT;  Surgeon: Sydnee Cabal, MD;  Location: [facility];  Service: Orthopedics;  Laterality: Right;   KNEE SURGERY Bilateral 2013    knee   MEDIAL PATELLOFEMORAL LIGAMENT REPAIR Right 01/05/2013   Procedure: MEDIAL PATELLA FEMORAL LIGAMENT RECONSTRUCTION;  Surgeon: Sydnee Cabal, MD;  Location: [facility];  Service: Orthopedics;  Laterality: Right;   rt hand little finger pinning  childhood    Current Medications: Current Meds  Medication Sig   acetaminophen (TYLENOL) 325 MG tablet Take 2 tablets (650 mg total) by mouth every 6 (six) hours as needed for mild pain or moderate pain.   apixaban (ELIQUIS) 5 MG TABS tablet start with two-5mg  tablets twice daily for 7 days. On day 8, switch to one-5mg  tablet twice daily. (Patient  taking differently: Take 5 mg by mouth 2 (two) times daily. )   aspirin 81 MG EC tablet    atenolol (TENORMIN) 50 MG tablet Take 50 mg by mouth daily.   atorvastatin (LIPITOR) 40 MG tablet Take 40 mg by mouth daily.   benztropine (COGENTIN) 0.5 MG tablet Take 0.5 mg by mouth daily.   fenofibrate micronized (LOFIBRA) 134 MG capsule Take 134 mg by mouth at bedtime.    gabapentin (NEURONTIN) 100 MG capsule Take 100 mg by mouth 4 (four) times daily.    loratadine (CLARITIN) 10 MG tablet Take 10 mg by mouth daily.   nitroGLYCERIN (NITROSTAT) 0.4  MG SL tablet Place 1 tablet (0.4 mg total) under the tongue every 5 (five) minutes as needed for chest pain.   omeprazole (PRILOSEC) 20 MG capsule Take 20 mg by mouth daily.   Oxcarbazepine (TRILEPTAL) 300 MG tablet Take 600 mg by mouth 2 (two) times daily.   risperiDONE (RISPERDAL) 2 MG tablet Take 2 mg by mouth 2 (two) times daily.    tamsulosin (FLOMAX) 0.4 MG CAPS capsule Take 0.4 mg by mouth 2 (two) times daily.    topiramate (TOPAMAX) 100 MG tablet Take by mouth.   [DISCONTINUED] nitroGLYCERIN (NITROSTAT) 0.4 MG SL tablet Place 1 tablet (0.4 mg total) under the tongue every 5 (five) minutes as needed for chest pain.     Allergies:   Fentanyl and Morphine   Social History   Socioeconomic History   Marital status: Single    Spouse name: Not on file   Number of children: Not on file   Years of education: Not on file   Highest education level: Not on file  Occupational History   Not on file  Social Needs   Financial resource strain: Not on file   Food insecurity    Worry: Not on file    Inability: Not on file   Transportation needs    Medical: Not on file    Non-medical: Not on file  Tobacco Use   Smoking status: Never Smoker   Smokeless tobacco: Former User    Types: Chew   Tobacco comment: occ chew  Substance and Sexual Activity   Alcohol use: Yes    Comment: occ beer   Drug use: Yes    Types: Other-see comments    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   Sexual activity: Not on file  Lifestyle   Physical activity    Days per week: Not on file    Minutes per session: Not on file   Stress: Not on file  Relationships   Social connections    Talks on phone: Not on file    Gets together: Not on file    Attends religious service: Not on file    Active member of club or organization: Not on file    Attends meetings of clubs or organizations: Not on file    Relationship status: Not on file  Other Topics Concern   Not on file  Social History  Narrative   Not on file     Family History: The patient's family history includes Heart disease in his father.  ROS:   Please see the history of present illness.    All other systems reviewed and are negative.  EKGs/Labs/Other Studies Reviewed:    The following studies were reviewed today: I discussed my findings with the patient at extensive length including event monitor report from the past.   Recent Labs: 03/16/2018: ALT  33; BUN 11; Creatinine, Ser 0.95; Potassium 4.5; Sodium 128  Recent Lipid Panel    Component Value Date/Time   CHOL 117 03/16/2018 0817   TRIG 90 03/16/2018 0817   HDL 33 (L) 03/16/2018 0817   CHOLHDL 3.5 03/16/2018 0817   LDLCALC 66 03/16/2018 0817    Physical Exam:    VS:  BP 98/70 (BP Location: Left Arm, Patient Position: Sitting, Cuff Size: Normal)    Pulse (!) 51    Ht 5\' 9"  (1.753 m)    Wt 172 lb (78 kg)    SpO2 100%    BMI 25.40 kg/m     Wt Readings from Last 3 Encounters:  09/13/18 172 lb (78 kg)  03/14/18 181 lb (82.1 kg)  12/16/17 179 lb (81.2 kg)     GEN: Patient is in no acute distress HEENT: Normal NECK: No JVD; No carotid bruits LYMPHATICS: No lymphadenopathy CARDIAC: Hear sounds regular, 2/6 systolic murmur at the apex. RESPIRATORY:  Clear to auscultation without rales, wheezing or rhonchi  ABDOMEN: Soft, non-tender, non-distended MUSCULOSKELETAL:  No edema; No deformity  SKIN: Warm and dry NEUROLOGIC:  Alert and oriented x 3 PSYCHIATRIC:  Normal affect   Signed, Jenean Lindau, MD  09/13/2018 3:43 PM    [facility]

## 2018-10-05 ENCOUNTER — Encounter: Payer: Self-pay | Admitting: Cardiology

## 2018-10-06 ENCOUNTER — Telehealth (HOSPITAL_COMMUNITY): Payer: Self-pay | Admitting: *Deleted

## 2018-10-06 NOTE — Telephone Encounter (Signed)
Patient given detailed instructions per Myocardial Perfusion Study Information Sheet for the test on 10/13/18. Patient notified to arrive 15 minutes early and that it is imperative to arrive on time for appointment to keep from having the test rescheduled.  If you need to cancel or reschedule your appointment, please call the office within 24 hours of your appointment. . Patient verbalized understanding. Andre Cervantes    

## 2018-10-13 ENCOUNTER — Telehealth: Payer: Self-pay | Admitting: Cardiology

## 2018-10-13 ENCOUNTER — Ambulatory Visit (INDEPENDENT_AMBULATORY_CARE_PROVIDER_SITE_OTHER): Payer: Medicare HMO

## 2018-10-13 ENCOUNTER — Other Ambulatory Visit: Payer: Self-pay

## 2018-10-13 DIAGNOSIS — R079 Chest pain, unspecified: Secondary | ICD-10-CM | POA: Diagnosis not present

## 2018-10-13 LAB — MYOCARDIAL PERFUSION IMAGING
LV dias vol: 90 mL (ref 62–150)
LV sys vol: 29 mL
Peak HR: 97 {beats}/min
Rest HR: 60 {beats}/min
SDS: 0
SRS: 0
SSS: 0
TID: 1.1

## 2018-10-13 MED ORDER — TECHNETIUM TC 99M TETROFOSMIN IV KIT
31.7000 | PACK | Freq: Once | INTRAVENOUS | Status: AC | PRN
  Administered 2018-10-13: 31.7 via INTRAVENOUS

## 2018-10-13 MED ORDER — TECHNETIUM TC 99M TETROFOSMIN IV KIT
9.9000 | PACK | Freq: Once | INTRAVENOUS | Status: AC | PRN
  Administered 2018-10-13: 9.9 via INTRAVENOUS

## 2018-10-13 MED ORDER — REGADENOSON 0.4 MG/5ML IV SOLN
0.4000 mg | Freq: Once | INTRAVENOUS | Status: AC
  Administered 2018-10-13: 0.4 mg via INTRAVENOUS

## 2018-10-13 NOTE — Telephone Encounter (Signed)
Pt is here for nuclear and has questions about lovenox

## 2018-10-13 NOTE — Telephone Encounter (Signed)
Patient states he was told by PCP that cardiology needed to order lovenox for upcoming knee surgery. RN explained that after his lexi results are back a copy of Dr. Docia Furl notes and report will be sent to surgeon Dr. Sydnee Cabal and lovenox will be discussed at that point.

## 2018-10-17 ENCOUNTER — Telehealth: Payer: Self-pay

## 2018-10-17 NOTE — Telephone Encounter (Signed)
Results relayed copy sent to Dr. Tomma Lightning and [facility]. No further questions.

## 2018-10-17 NOTE — Telephone Encounter (Signed)
-----   Message from Jenean Lindau, MD sent at 10/13/2018  3:02 PM EDT ----- The results of the study is unremarkable. Please inform patient. I will discuss in detail at next appointment. Cc  primary care/referring physician Jenean Lindau, MD 10/13/2018 3:01 PM

## 2018-11-10 DIAGNOSIS — Z86718 Personal history of other venous thrombosis and embolism: Secondary | ICD-10-CM | POA: Diagnosis not present

## 2018-11-10 DIAGNOSIS — Z7901 Long term (current) use of anticoagulants: Secondary | ICD-10-CM

## 2018-12-21 NOTE — H&P (Signed)
TOTAL KNEE ADMISSION H&P  Patient is being admitted for left patellofemoral arthroplasty and MPFL reconstruction.  Subjective:  Chief Complaint:left knee pain.  HPI: Andre Cervantes, 33 y.o. male, has a history of pain and functional disability in the left knee due to trauma and has failed non-surgical conservative treatments for greater than 12 weeks to includeNSAID's and/or analgesics, corticosteriod injections, flexibility and strengthening excercises, supervised PT with diminished ADL's post treatment, use of assistive devices and activity modification.  Onset of symptoms was abrupt, starting 2 years ago with gradually worsening course since that time. The patient noted prior procedures on the knee to include  arthroscopy and menisectomy on the left knee(s).  Patient currently rates pain in the left knee(s) at 6 out of 10 with activity. Patient has night pain, worsening of pain with activity and weight bearing, pain that interferes with activities of daily living and pain with passive range of motion.  Patient has evidence of periarticular osteophytes and joint space narrowing by imaging studies. This patient has had patella dislocation. There is no active infection.  Patient Active Problem List   Diagnosis Date Noted  . Hyponatremia 03/14/2018  . DOE (dyspnea on exertion) 12/16/2017  . Chest tightness 08/16/2017  . History of pulmonary embolism 08/16/2017  . Thrombocytosis ([place]) 06/29/2017  . 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 Dental infection 01/17/2017  . Leukocytosis 01/17/2017  . Fusion of lumbar spine   . HCAP (healthcare-associated pneumonia)   . Pneumonia   . 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 S/P knee surgery 01/06/2013  . Status post knee surgery 01/05/2013  . DVT (deep venous thrombosis) ([place]) 10/26/2011  . HYPERLIPIDEMIA 11/07/2009  . Anxiety state 11/07/2009  . TOBACCO ABUSE 11/07/2009  . Depression 11/07/2009  . Attention deficit hyperactivity  disorder (ADHD) 11/07/2009  . GERD 11/07/2009  . OTHER DYSPHAGIA 11/07/2009   Past Medical History:  Diagnosis Date  . ADD (attention deficit disorder)   . Arthritis   . DVT (deep venous thrombosis) ([place]) 2013  . GERD (gastroesophageal reflux disease)   . Hypertension   . PE (pulmonary embolism) 13   hx    Past Surgical History:  Procedure Laterality Date  . FRACTURE SURGERY     rt hand  . KNEE ARTHROSCOPY WITH LATERAL RELEASE Right 01/05/2013   Procedure: KNEE ARTHROSCOPY WITH DEBRIDEMENT, LATERAL RELEASE, CARTICEL PROCEDURE, OPEN ALLOGRAFT;  Surgeon: Sydnee Cabal, MD;  Location: [facility];  Service: Orthopedics;  Laterality: Right;  . KNEE SURGERY Bilateral 2013    knee  . MEDIAL PATELLOFEMORAL LIGAMENT REPAIR Right 01/05/2013   Procedure: MEDIAL PATELLA FEMORAL LIGAMENT RECONSTRUCTION;  Surgeon: Sydnee Cabal, MD;  Location: [facility];  Service: Orthopedics;  Laterality: Right;  . rt hand little finger pinning  childhood    No current facility-administered medications for this encounter.    Current Outpatient Medications  Medication Sig Dispense Refill Last Dose  . acetaminophen (TYLENOL) 500 MG tablet Take 1,000 mg by mouth every 6 (six) hours as needed (for pain.).     Marland Kitchen apixaban (ELIQUIS) 5 MG TABS tablet start with two-5mg  tablets twice daily for 7 days. On day 8, switch to one-5mg  tablet twice daily. (Patient taking differently: Take 5 mg by mouth 2 (two) times daily. ) 70 tablet 0   . atenolol (TENORMIN) 50 MG tablet Take 50 mg by mouth daily.     Marland Kitchen atorvastatin (LIPITOR) 40 MG tablet Take 40 mg by mouth every evening.      Marland Kitchen  benztropine (COGENTIN) 0.5 MG tablet Take 0.5 mg by mouth daily.     . fenofibrate micronized (LOFIBRA) 134 MG capsule Take 134 mg by mouth at bedtime.      . gabapentin (NEURONTIN) 100 MG capsule Take 100 mg by mouth every 8 (eight) hours.      Marland Kitchen lamoTRIgine (LAMICTAL) 25 MG tablet Take 25 mg by mouth 2 (two)  times daily.     Marland Kitchen loratadine (CLARITIN) 10 MG tablet Take 10 mg by mouth daily.     . nitroGLYCERIN (NITROSTAT) 0.4 MG SL tablet Place 1 tablet (0.4 mg total) under the tongue every 5 (five) minutes as needed for chest pain. 25 tablet 11   . omeprazole (PRILOSEC) 20 MG capsule Take 20 mg by mouth daily.     . risperiDONE (RISPERDAL) 2 MG tablet Take 2 mg by mouth 2 (two) times daily.      . tamsulosin (FLOMAX) 0.4 MG CAPS capsule Take 0.4 mg by mouth 2 (two) times daily.      Marland Kitchen topiramate (TOPAMAX) 100 MG tablet Take 100 mg by mouth at bedtime.       Allergies  Allergen Reactions  . Fentanyl Other (See Comments)    Patient developed severe chest wall rigidity following IV dose of Fentanyl for post op pain management. Reaction occurred on first and second administration of Fentanyl intraop producing significant drop in SaO2 and increased airway pressures.  . Morphine Swelling    Social History   Tobacco Use  . Smoking status: Never Smoker  . Smokeless tobacco: Former Systems developer    Types: Chew  . Tobacco comment: occ chew  Substance Use Topics  . Alcohol use: Yes    Comment: occ beer    Family History  Problem Relation Age of Onset  . Heart disease Father      Review of Systems  Constitutional: Negative.   HENT: Negative.        Dentures  Eyes: Negative.   Respiratory: Negative.   Cardiovascular: Negative.   Gastrointestinal: Positive for heartburn.  Genitourinary:       Night time urination  Musculoskeletal: Positive for back pain and joint pain.  Skin: Negative.   Neurological: Negative.   Endo/Heme/Allergies: Negative.   Psychiatric/Behavioral: Negative.     Objective:  Physical Exam  Constitutional: He is oriented to person, place, and time. He appears well-developed and well-nourished.  HENT:  Head: Normocephalic and atraumatic.  Eyes: EOM are normal.  Neck: Normal range of motion. Neck supple. No JVD present. No tracheal deviation present. No thyromegaly present.   Cardiovascular: Normal rate, regular rhythm, normal heart sounds and intact distal pulses.  Respiratory: Effort normal and breath sounds normal. No stridor.  GI: Soft. There is no abdominal tenderness. There is no rebound.  Musculoskeletal:     Comments: Tenderness over the patellofemoral joint Pain with ROM of knee NVI in left lower extremity  Neurological: He is alert and oriented to person, place, and time.  Skin: Skin is warm and dry.  Psychiatric: He has a normal mood and affect. His behavior is normal. Thought content normal.    Vital signs in last 24 hours: BP: ()/()  Arterial Line BP: ()/()   Labs:   Estimated body mass index is 25.4 kg/m as calculated from the following:   Height as of 10/13/18: 5\' 9"  (1.753 m).   Weight as of 10/13/18: 78 kg.   Imaging Review Plain radiographs demonstrate mild degenerative joint disease of the left knee(s). The overall  alignment isneutral. The bone quality appears to be good for age and reported activity level.      Assessment/Plan:  End stage arthritis, left patella femoral joint  The patient history, physical examination, clinical judgment of the provider and imaging studies are consistent with end stage degenerative joint disease of the left knee(s) and patellofemoral arthroplasty and allograft MPFL reconstruction is deemed medically necessary. The treatment options including medical management, injection therapy arthroscopy and arthroplasty were discussed at length. The risks and benefits of total knee arthroplasty were presented and reviewed. The risks due to aseptic loosening, infection, stiffness, patella tracking problems, thromboembolic complications and other imponderables were discussed. The patient acknowledged the explanation, agreed to proceed with the plan and consent was signed. Patient is being admitted for inpatient treatment for surgery, pain control, PT, OT, prophylactic antibiotics, VTE prophylaxis, progressive  ambulation and ADL's and discharge planning. The patient is planning to be discharged home with home health services

## 2018-12-26 NOTE — Patient Instructions (Addendum)
DUE TO COVID-19 ONLY ONE VISITOR IS ALLOWED TO COME WITH YOU AND STAY IN THE WAITING ROOM ONLY DURING PRE OP AND PROCEDURE DAY OF SURGERY. THE 1 VISITOR MAY VISIT WITH YOU AFTER SURGERY IN YOUR PRIVATE ROOM DURING VISITING HOURS ONLY!  YOU NEED TO HAVE A COVID 19 TEST ON_______ @_______ , THIS TEST MUST BE DONE BEFORE SURGERY, COME  [place], [address] [place] , [id number].  ([place]) ONCE YOUR COVID TEST IS COMPLETED, PLEASE BEGIN THE QUARANTINE INSTRUCTIONS AS OUTLINED IN YOUR HANDOUT.                THELMER KLITZ  12/26/2018   Your procedure is scheduled on: 12-30-18   Report to [facility] Main  Entrance   Report to admitting at         0800 AM     Call this number if you have problems the morning of surgery [phone]    Remember: NO SOLID FOOD AFTER MIDNIGHT THE NIGHT PRIOR TO SURGERY. NOTHING BY MOUTH EXCEPT CLEAR LIQUIDS UNTIL    0730 am  . PLEASE FINISH ENSURE DRINK PER SURGEON ORDER  WHICH NEEDS TO BE COMPLETED AT       0730 am then nothing by mouth .    CLEAR LIQUID DIET   Foods Allowed                                                                     Foods Excluded  Coffee and tea, regular and decaf                             liquids that you cannot  Plain Jell-O any favor except red or purple                                           see through such as: Fruit ices (not with fruit pulp)                                     milk, soups, orange juice  Iced Popsicles                                    All solid food Carbonated beverages, regular and diet                                    Cranberry, grape and apple juices Sports drinks like Gatorade Lightly seasoned clear broth or consume(fat free) Sugar, honey syrup _____________________________________________________________________     BRUSH YOUR TEETH MORNING OF SURGERY AND RINSE YOUR MOUTH OUT, NO CHEWING GUM CANDY OR MINTS.     Take these medicines the morning of surgery  with A SIP OF WATER: flomax, omeprazole, loratadine,  gabapentin, cogentin, atenelol, tylenol if needed  You may not have any metal on your body including hair pins and              piercings  Do not wear jewelry,  lotions, powders or perfumes, deodorant              Men may shave face and neck.   Do not bring valuables to the hospital. [place].  Contacts, dentures or bridgework may not be worn into surgery.                 Please read over the following fact sheets you were given: _____________________________________________________________________           [facility] - Preparing for Surgery Before surgery, you can play an important role.  Because skin is not sterile, your skin needs to be as free of germs as possible.  You can reduce the number of germs on your skin by washing with CHG (chlorahexidine gluconate) soap before surgery.  CHG is an antiseptic cleaner which kills germs and bonds with the skin to continue killing germs even after washing. Please DO NOT use if you have an allergy to CHG or antibacterial soaps.  If your skin becomes reddened/irritated stop using the CHG and inform your nurse when you arrive at Short Stay. Do not shave (including legs and underarms) for at least 48 hours prior to the first CHG shower.  You may shave your face/neck. Please follow these instructions carefully:  1.  Shower with CHG Soap the night before surgery and the  morning of Surgery.  2.  If you choose to wash your hair, wash your hair first as usual with your  normal  shampoo.  3.  After you shampoo, rinse your hair and body thoroughly to remove the  shampoo.                           4.  Use CHG as you would any other liquid soap.  You can apply chg directly  to the skin and wash                       Gently with a scrungie or clean washcloth.  5.  Apply the CHG Soap to your body ONLY FROM THE NECK DOWN.    Do not use on face/ open                           Wound or open sores. Avoid contact with eyes, ears mouth and genitals (private parts).                       Wash face,  Genitals (private parts) with your normal soap.             6.  Wash thoroughly, paying special attention to the area where your surgery  will be performed.  7.  Thoroughly rinse your body with warm water from the neck down.  8.  DO NOT shower/wash with your normal soap after using and rinsing off  the CHG Soap.                9.  Pat yourself dry with a clean towel.            10.  Wear  clean pajamas.            11.  Place clean sheets on your bed the night of your first shower and do not  sleep with pets. Day of Surgery : Do not apply any lotions/deodorants the morning of surgery.  Please wear clean clothes to the hospital/surgery center.  FAILURE TO FOLLOW THESE INSTRUCTIONS MAY RESULT IN THE CANCELLATION OF YOUR SURGERY PATIENT SIGNATURE_________________________________  NURSE SIGNATURE__________________________________  ________________________________________________________________________   Adam Phenix  An incentive spirometer is a tool that can help keep your lungs clear and active. This tool measures how well you are filling your lungs with each breath. Taking long deep breaths may help reverse or decrease the chance of developing breathing (pulmonary) problems (especially infection) following:  A long period of time when you are unable to move or be active. BEFORE THE PROCEDURE   If the spirometer includes an indicator to show your best effort, your nurse or respiratory therapist will set it to a desired goal.  If possible, sit up straight or lean slightly forward. Try not to slouch.  Hold the incentive spirometer in an upright position. INSTRUCTIONS FOR USE  1. Sit on the edge of your bed if possible, or sit up as far as you can in bed or on a chair. 2. Hold the incentive spirometer in an  upright position. 3. Breathe out normally. 4. Place the mouthpiece in your mouth and seal your lips tightly around it. 5. Breathe in slowly and as deeply as possible, raising the piston or the ball toward the top of the column. 6. Hold your breath for 3-5 seconds or for as long as possible. Allow the piston or ball to fall to the bottom of the column. 7. Remove the mouthpiece from your mouth and breathe out normally. 8. Rest for a few seconds and repeat Steps 1 through 7 at least 10 times every 1-2 hours when you are awake. Take your time and take a few normal breaths between deep breaths. 9. The spirometer may include an indicator to show your best effort. Use the indicator as a goal to work toward during each repetition. 10. After each set of 10 deep breaths, practice coughing to be sure your lungs are clear. If you have an incision (the cut made at the time of surgery), support your incision when coughing by placing a pillow or rolled up towels firmly against it. Once you are able to get out of bed, walk around indoors and cough well. You may stop using the incentive spirometer when instructed by your caregiver.  RISKS AND COMPLICATIONS  Take your time so you do not get dizzy or light-headed.  If you are in pain, you may need to take or ask for pain medication before doing incentive spirometry. It is harder to take a deep breath if you are having pain. AFTER USE  Rest and breathe slowly and easily.  It can be helpful to keep track of a log of your progress. Your caregiver can provide you with a simple table to help with this. If you are using the spirometer at home, follow these instructions: [place] IF:   You are having difficultly using the spirometer.  You have trouble using the spirometer as often as instructed.  Your pain medication is not giving enough relief while using the spirometer.  You develop fever of 100.5 F (38.1 C) or higher. SEEK IMMEDIATE MEDICAL CARE  IF:   You cough up bloody sputum that had  not been present before.  You develop fever of 102 F (38.9 C) or greater.  You develop worsening pain at or near the incision site. MAKE SURE YOU:   Understand these instructions.  Will watch your condition.  Will get help right away if you are not doing well or get worse. Document Released: 05/25/2006 Document Revised: 04/06/2011 Document Reviewed: 07/26/2006 [facility] Patient Information 2014 [place], [place].   ________________________________________________________________________

## 2018-12-26 NOTE — Progress Notes (Signed)
PCP - Aldona Bar  Cardiologist -   Chest x-ray -  EKG - 03-14-18 epic Stress Test - 10-13-18 epic ECHO - 08-2017 epic Cardiac Cath -  CT coronary 03-01-18 epic   Sleep Study -   CPAP -   Fasting Blood Sugar -  Checks Blood Sugar _____ times a day  Blood Thinner Instructions:elequis  Last dose 12-27-18 Aspirin Instructions: Last Dose:  Anesthesia review: elequis  Patient denies shortness of breath, fever, cough and chest pain at PAT appointment     Patient verbalized understanding of instructions that were given to them at the PAT appointment. Patient was also instructed that they will need to review over the PAT instructions again at home before surgery.

## 2018-12-28 ENCOUNTER — Other Ambulatory Visit: Payer: Self-pay

## 2018-12-28 ENCOUNTER — Inpatient Hospital Stay (HOSPITAL_COMMUNITY): Admission: RE | Admit: 2018-12-28 | Payer: Medicare HMO | Source: Ambulatory Visit

## 2018-12-28 ENCOUNTER — Encounter (HOSPITAL_COMMUNITY): Payer: Self-pay

## 2018-12-28 ENCOUNTER — Encounter (HOSPITAL_COMMUNITY)
Admission: RE | Admit: 2018-12-28 | Discharge: 2018-12-28 | Disposition: A | Discharge status: Home / Self Care | Payer: Medicare HMO | Source: Ambulatory Visit | Attending: Specialist | Admitting: Specialist

## 2018-12-28 DIAGNOSIS — M1712 Unilateral primary osteoarthritis, left knee: Secondary | ICD-10-CM | POA: Diagnosis not present

## 2018-12-28 DIAGNOSIS — Z01812 Encounter for preprocedural laboratory examination: Secondary | ICD-10-CM | POA: Insufficient documentation

## 2018-12-28 HISTORY — DX: Pure hypercholesterolemia, unspecified: E78.00

## 2018-12-28 HISTORY — DX: Pneumonia, unspecified organism: J18.9

## 2018-12-28 LAB — BASIC METABOLIC PANEL
Anion gap: 9 (ref 5–15)
BUN: 21 mg/dL — ABNORMAL HIGH (ref 6–20)
CO2: 20 mmol/L — ABNORMAL LOW (ref 22–32)
Calcium: 9.5 mg/dL (ref 8.9–10.3)
Chloride: 114 mmol/L — ABNORMAL HIGH (ref 98–111)
Creatinine, Ser: 1.31 mg/dL — ABNORMAL HIGH (ref 0.61–1.24)
GFR calc Af Amer: >60 mL/min (ref >60)
GFR calc non Af Amer: >60 mL/min (ref >60)
Glucose, Bld: 97 mg/dL (ref 70–99)
Potassium: 4.2 mmol/L (ref 3.5–5.1)
Sodium: 143 mmol/L (ref 135–145)

## 2018-12-28 LAB — CBC
HCT: 39.8 % (ref 39.0–52.0)
Hemoglobin: 12.6 g/dL — ABNORMAL LOW (ref 13.0–17.0)
MCH: 29.9 pg (ref 26.0–34.0)
MCHC: 31.7 g/dL (ref 30.0–36.0)
MCV: 94.5 fL (ref 80.0–100.0)
Platelets: 439 10*3/uL — ABNORMAL HIGH (ref 150–400)
RBC: 4.21 MIL/uL — ABNORMAL LOW (ref 4.22–5.81)
RDW: 13.1 % (ref 11.5–15.5)
WBC: 9.3 10*3/uL (ref 4.0–10.5)
nRBC: 0 % (ref 0.0–0.2)

## 2018-12-28 LAB — SURGICAL PCR SCREEN
MRSA, PCR: NEGATIVE
Staphylococcus aureus: POSITIVE — AB

## 2018-12-29 NOTE — Progress Notes (Signed)
12/30/2018 Surgery cancelled

## 2018-12-30 ENCOUNTER — Ambulatory Visit (HOSPITAL_COMMUNITY): Admission: RE | Admit: 2018-12-30 | Payer: Medicare HMO | Source: Home / Self Care | Admitting: Specialist

## 2018-12-30 ENCOUNTER — Encounter (HOSPITAL_COMMUNITY): Admission: RE | Payer: Self-pay | Source: Home / Self Care

## 2018-12-30 SURGERY — ARTHROPLASTY, KNEE, TOTAL
Anesthesia: Spinal | Site: Knee | Laterality: Left

## 2019-02-15 NOTE — H&P (Signed)
ADMISSION H&P  Patient is being admitted for left patellofemoral arthroplasty and MPFL reconstruction.  Subjective:  Chief Complaint:left knee pain.  HPI: Andre Cervantes, 34 y.o. male, has a history of pain and functional disability in the left knee due to trauma and has failed non-surgical conservative treatments for greater than 12 weeks to includeNSAID's and/or analgesics, corticosteriod injections, flexibility and strengthening excercises, supervised PT with diminished ADL's post treatment, use of assistive devices and activity modification.  Onset of symptoms was abrupt, starting 2 years ago with gradually worsening course since that time. The patient noted prior procedures on the knee to include  arthroscopy and menisectomy on the left knee(s).  Patient currently rates pain in the left knee(s) at 6 out of 10 with activity. Patient has night pain, worsening of pain with activity and weight bearing, pain that interferes with activities of daily living and pain with passive range of motion.  Patient has evidence of periarticular osteophytes and joint space narrowing by imaging studies. This patient has had patella dislocation. There is no active infection.  Patient Active Problem List   Diagnosis Date Noted  . Hyponatremia 03/14/2018  . DOE (dyspnea on exertion) 12/16/2017  . Chest tightness 08/16/2017  . History of pulmonary embolism 08/16/2017  . Thrombocytosis ([place]) 06/29/2017  . 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 Dental infection 01/17/2017  . Leukocytosis 01/17/2017  . Fusion of lumbar spine   . HCAP (healthcare-associated pneumonia)   . Pneumonia   . 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 S/P knee surgery 01/06/2013  . Status post knee surgery 01/05/2013  . DVT (deep venous thrombosis) ([place]) 10/26/2011  . HYPERLIPIDEMIA 11/07/2009  . Anxiety state 11/07/2009  . TOBACCO ABUSE 11/07/2009  . Depression 11/07/2009  . Attention deficit hyperactivity disorder  (ADHD) 11/07/2009  . GERD 11/07/2009  . OTHER DYSPHAGIA 11/07/2009   Past Medical History:  Diagnosis Date  . ADD (attention deficit disorder)   . Arthritis   . DVT (deep venous thrombosis) ([place]) 2013  . GERD (gastroesophageal reflux disease)   . High cholesterol   . Hypertension   . PE (pulmonary embolism) 13   hx  . Pneumonia     Past Surgical History:  Procedure Laterality Date  . BACK SURGERY    . cyst removed      Head dermatologist removed in office  . FRACTURE SURGERY     rt hand  . KNEE ARTHROSCOPY WITH LATERAL RELEASE Right 01/05/2013   Procedure: KNEE ARTHROSCOPY WITH DEBRIDEMENT, LATERAL RELEASE, CARTICEL PROCEDURE, OPEN ALLOGRAFT;  Surgeon: Sydnee Cabal, MD;  Location: [place];  Service: Orthopedics;  Laterality: Right;  . KNEE SURGERY Bilateral 2013    knee  . MEDIAL PATELLOFEMORAL LIGAMENT REPAIR Right 01/05/2013   Procedure: MEDIAL PATELLA FEMORAL LIGAMENT RECONSTRUCTION;  Surgeon: Sydnee Cabal, MD;  Location: [place];  Service: Orthopedics;  Laterality: Right;  . rt hand little finger pinning  childhood    No current facility-administered medications for this encounter.   Current Outpatient Medications  Medication Sig Dispense Refill Last Dose  . acetaminophen (TYLENOL) 500 MG tablet Take 1,000 mg by mouth every 6 (six) hours as needed (for pain.).     Marland Kitchen apixaban (ELIQUIS) 5 MG TABS tablet start with two-5mg  tablets twice daily for 7 days. On day 8, switch to one-5mg  tablet twice daily. (Patient taking differently: Take 5 mg by mouth 2 (two) times daily. ) 70 tablet 0   . atenolol (TENORMIN) 50 MG tablet Take  50 mg by mouth daily.     Marland Kitchen atorvastatin (LIPITOR) 40 MG tablet Take 40 mg by mouth every evening.      . benztropine (COGENTIN) 0.5 MG tablet Take 0.5 mg by mouth daily.     . fenofibrate micronized (LOFIBRA) 134 MG capsule Take 134 mg by mouth at bedtime.      . gabapentin (NEURONTIN) 100 MG capsule Take 100 mg  by mouth every 8 (eight) hours.      Marland Kitchen lamoTRIgine (LAMICTAL) 25 MG tablet Take 25 mg by mouth 2 (two) times daily.     Marland Kitchen loratadine (CLARITIN) 10 MG tablet Take 10 mg by mouth daily.     . nitroGLYCERIN (NITROSTAT) 0.4 MG SL tablet Place 1 tablet (0.4 mg total) under the tongue every 5 (five) minutes as needed for chest pain. 25 tablet 11   . omeprazole (PRILOSEC) 20 MG capsule Take 20 mg by mouth daily.     . risperiDONE (RISPERDAL) 2 MG tablet Take 2 mg by mouth 2 (two) times daily.      . tamsulosin (FLOMAX) 0.4 MG CAPS capsule Take 0.4 mg by mouth 2 (two) times daily.      Marland Kitchen topiramate (TOPAMAX) 100 MG tablet Take 100 mg by mouth at bedtime.       Allergies  Allergen Reactions  . Fentanyl Other (See Comments)    Patient developed severe chest wall rigidity following IV dose of Fentanyl for post op pain management. Reaction occurred on first and second administration of Fentanyl intraop producing significant drop in SaO2 and increased airway pressures.  . Morphine Swelling    Social History   Tobacco Use  . Smoking status: Never Smoker  . Smokeless tobacco: Former Systems developer    Types: Chew  . Tobacco comment: occ chew  Substance Use Topics  . Alcohol use: Not Currently    Comment: occ beer    Family History  Problem Relation Age of Onset  . Heart disease Father      Review of Systems  Constitutional: Negative.   HENT: Negative.        Dentures  Eyes: Negative.   Respiratory: Negative.   Cardiovascular: Negative.   Gastrointestinal: Positive for heartburn.  Genitourinary:       Night time urination  Musculoskeletal: Positive for back pain and joint pain.  Skin: Negative.   Neurological: Negative.   Endo/Heme/Allergies: Negative.   Psychiatric/Behavioral: Negative.     Objective:  Physical Exam  Constitutional: He is oriented to person, place, and time. He appears well-developed and well-nourished.  HENT:  Head: Normocephalic and atraumatic.  Eyes: EOM are normal.   Neck: No JVD present. No tracheal deviation present. No thyromegaly present.  Cardiovascular: Normal rate, regular rhythm, normal heart sounds and intact distal pulses.  Respiratory: Effort normal and breath sounds normal. No stridor.  GI: Soft. There is no abdominal tenderness. There is no rebound.  Musculoskeletal:     Cervical back: Normal range of motion and neck supple.     Comments: Tenderness over the patellofemoral joint Pain with ROM of knee NVI in left lower extremity  Neurological: He is alert and oriented to person, place, and time.  Skin: Skin is warm and dry.  Psychiatric: He has a normal mood and affect. His behavior is normal. Thought content normal.    Vital signs in last 24 hours: BP: ()/()  Arterial Line BP: ()/()   Labs:   Estimated body mass index is 26.29 kg/m as calculated from the  following:   Height as of 12/28/18: 5\' 9"  (1.753 m).   Weight as of 12/28/18: 80.7 kg.   Imaging Review Plain radiographs demonstrate mild degenerative joint disease of the left knee mostly patellofemoral joint. The overall alignment isneutral. The bone quality appears to be good for age and reported activity level.      Assessment/Plan:  End stage arthritis, left patella femoral joint  The patient history, physical examination, clinical judgment of the provider and imaging studies are consistent with end stage degenerative joint disease of the left knee patellofemoral joint and patellofemoral arthroplasty and allograft MPFL reconstruction is deemed medically necessary. The treatment options including medical management, injection therapy arthroscopy and arthroplasty were discussed at length. The risks and benefits of surgery were presented and reviewed. The risks due to aseptic loosening, infection, stiffness, patella tracking problems, thromboembolic complications and other imponderables were discussed. The patient acknowledged the explanation, agreed to proceed with the plan  and consent was signed. Patient is being admitted for 23 hour observation treatment for surgery, pain control, PT, OT, prophylactic antibiotics, VTE prophylaxis, progressive ambulation and ADL's and discharge planning. The patient is planning to be discharged home with home health services the day after surgery.

## 2019-02-20 ENCOUNTER — Encounter (HOSPITAL_BASED_OUTPATIENT_CLINIC_OR_DEPARTMENT_OTHER): Payer: Self-pay | Admitting: Specialist

## 2019-02-20 ENCOUNTER — Other Ambulatory Visit: Payer: Self-pay

## 2019-02-20 NOTE — Progress Notes (Addendum)
Spoke w/ via phone for pre-op interview---kim Strehle step mother Lab needs dos----none              Lab results------lab appt 02-22-2019 [facility] with dif and cmet COVID test ------+ covid 01-12-2019, requested + covid test result from [facility] addendum: covid positive test results on chart from 01-12-2019 [facility]. Arrive at -------530 am 02-24-2019 No food after midnight, water unitl 430 am and drink ensure presurgery drink at 430 am, then npo Medications to take morning of surgery -----gabapentin, loratadine, benzotropine, risperdal, atenolol, tamsulosin, omeprazole Diabetic medication -----n/a Patient Special Instructions ----- Pre-Op special Istructions ----- Patient verbalized understanding of instructions that were given at this phone interview.    Anesthesia : on eliquis sob when walking dog, tires easily, cough and sneezing  and runny nose at times still (symptoms with covid were sneezing, cough and tires easily) [id number] [facility] Cardiologist :dr Earnestine Leys 09-13-2018 chart/epic Chest x-ray  EKG :feb 17-2020 chart/epic Echo :09-06-217 chart/epic Cardiac Cath : none Sleep Study/ CPAP :none Fasting Blood Sugar :      / Checks Blood Sugar -- times a day:  n/a Blood Thinner/ Instructions /Last Dose:eliquis, instructed to stop 2 days before surgery per [facility] ASA / Instructions/ Last Dose :n/a.

## 2019-02-20 NOTE — Progress Notes (Signed)
Per Andre Cervantes at dr Theda Sers office patient signs own paperwork, no legal guardian

## 2019-02-21 NOTE — Progress Notes (Addendum)
Spoke with patient legal guardian dennis Doring father, father to bring copy of guardian ship to pre op visit tomorrow. Legal guardian dennis Korber intructed by [facility]to  have patient to not take eliquis today and hold eliquis until after surgery. Legal guardian coming for lab appointment tomorrow. Jessica zanetto pa reviewed chart and ok to proceed and jessica zanetto will see patient with lab visit tomorrow. Patient  Is not having cough or shortness of breath per legal guardian dennis Holten.

## 2019-02-21 NOTE — Progress Notes (Signed)
Order placed in epic for pt/inr with labs tomorrow per Janett Billow zanetto pa.

## 2019-02-21 NOTE — Progress Notes (Signed)
Anesthesia Chart Review   Case: [id number] Date/Time: 02/24/19 0715   Procedure: Knee allograft MPFL reconstruction patellofemoral arthroplasty (Left Knee) - adductor canal   Anesthesia type: Spinal   Pre-op diagnosis:      Left knee patella maltracking      patellafemoral osteoarthritis   Location: [place] OR ROOM 4. / [place]   Surgeons: Sydnee Cabal, MD       DISCUSSION:33 y.o. never smoker with h/o GERD, HTN, DVT, PE 2013 on Eliquis, left knee patella maltracking, patellafemoral OS scheduled for above procedure 02/24/19 with Dr. Sydnee Cabal.   Pt last seen by cardiologist, Dr. Geraldo Pitter, 09/13/2018.  Per OV note pt experienced an episode of chest pain which resolved with nitro.  Stress test 10/13/2018 low risk study.   Pt reports he was instructed to hold Eliquis 2 days prior to surgery with Lovenox bridge. Instructions given to pt by hematologist. Discussed with Dr. Theda Sers scheduler as he is currently scheduled for a spinal.   Pt tested positive for COVID in 01/12/2019, results on chart. Pt seen by myself during lab visit.  He and his father report at this time all symptoms have resolved.  He denies shortness of breath, cough, fatigue.  Lungs clear to auscultation.   VS: There were no vitals taken for this visit.  PROVIDERS: [facility] is PCP   Revankar, Sunny Schlein, MD is Cardiologist  LABS:  labs DOS  (all labs ordered are listed, but only abnormal results are displayed)  Labs Reviewed - No data to display   IMAGES:   EKG: 03/14/2018 Rate 61 bpm  Normal sinus rhythm   CV: Myocardial Perfusion Imaging 10/13/2018 The left ventricular ejection fraction is hyperdynamic (>65%). Nuclear stress EF: 68%. There was no ST segment deviation noted during stress. Defect 1: There is a small defect of mild severity present in the basal inferior location. The study is normal. This is a low risk study.    Echo 01/17/2017 Study Conclusions   - Left  ventricle: The cavity size was normal. Wall thickness was   normal. Systolic function was normal. The estimated ejection   fraction was in the range of 55% to 60%. Wall motion was normal;   there were no regional wall motion abnormalities. - Mitral valve: There was mild regurgitation. - Right ventricle: The cavity size was normal. Wall thickness was   normal. Systolic function was normal. - Tricuspid valve: There was mild regurgitation.   Past Medical History:  Diagnosis Date   ADD (attention deficit disorder)    Arthritis    DVT (deep venous thrombosis) ([place]) 2013   GERD (gastroesophageal reflux disease)    High cholesterol    Hypertension    PE (pulmonary embolism) 2013, 2016 or 2018   hx   Pneumonia yrs ago    Past Surgical History:  Procedure Laterality Date   BACK SURGERY     cyst removed      Head dermatologist removed in office   FRACTURE SURGERY     rt hand   KNEE ARTHROSCOPY WITH LATERAL RELEASE Right 01/05/2013   Procedure: KNEE ARTHROSCOPY WITH DEBRIDEMENT, LATERAL RELEASE, CARTICEL PROCEDURE, OPEN ALLOGRAFT;  Surgeon: Sydnee Cabal, MD;  Location: [place];  Service: Orthopedics;  Laterality: Right;   KNEE SURGERY Bilateral 2013    knee   MEDIAL PATELLOFEMORAL LIGAMENT REPAIR Right 01/05/2013   Procedure: MEDIAL PATELLA FEMORAL LIGAMENT RECONSTRUCTION;  Surgeon: Sydnee Cabal, MD;  Location: [place];  Service: Orthopedics;  Laterality: Right;   rt hand little finger pinning  childhood    MEDICATIONS:  acetaminophen (TYLENOL) 500 MG tablet   apixaban (ELIQUIS) 5 MG TABS tablet   atenolol (TENORMIN) 50 MG tablet   atorvastatin (LIPITOR) 40 MG tablet   benztropine (COGENTIN) 0.5 MG tablet   enoxaparin (LOVENOX) 120 MG/0.8ML injection   fenofibrate micronized (LOFIBRA) 134 MG capsule   gabapentin (NEURONTIN) 100 MG capsule   loratadine (CLARITIN) 10 MG tablet   nitroGLYCERIN (NITROSTAT) 0.4 MG SL tablet   omeprazole  (PRILOSEC) 20 MG capsule   risperiDONE (RISPERDAL) 2 MG tablet   tamsulosin (FLOMAX) 0.4 MG CAPS capsule   topiramate (TOPAMAX) 100 MG tablet   No current facility-administered medications for this encounter.   Konrad Felix, PA-C [facility] Pre-Surgical Testing [phone] 2:07 PM

## 2019-02-22 ENCOUNTER — Other Ambulatory Visit: Payer: Self-pay

## 2019-02-22 ENCOUNTER — Encounter (HOSPITAL_COMMUNITY)
Admission: RE | Admit: 2019-02-22 | Discharge: 2019-02-22 | Disposition: A | Discharge status: Home / Self Care | Payer: Medicare HMO | Source: Ambulatory Visit | Attending: Specialist | Admitting: Specialist

## 2019-02-22 DIAGNOSIS — Z86711 Personal history of pulmonary embolism: Secondary | ICD-10-CM | POA: Insufficient documentation

## 2019-02-22 DIAGNOSIS — E7800 Pure hypercholesterolemia, unspecified: Secondary | ICD-10-CM | POA: Diagnosis not present

## 2019-02-22 DIAGNOSIS — M1712 Unilateral primary osteoarthritis, left knee: Secondary | ICD-10-CM | POA: Diagnosis not present

## 2019-02-22 DIAGNOSIS — K219 Gastro-esophageal reflux disease without esophagitis: Secondary | ICD-10-CM | POA: Diagnosis not present

## 2019-02-22 DIAGNOSIS — Z79899 Other long term (current) drug therapy: Secondary | ICD-10-CM | POA: Diagnosis not present

## 2019-02-22 DIAGNOSIS — Z7901 Long term (current) use of anticoagulants: Secondary | ICD-10-CM | POA: Diagnosis not present

## 2019-02-22 DIAGNOSIS — Z86718 Personal history of other venous thrombosis and embolism: Secondary | ICD-10-CM | POA: Diagnosis not present

## 2019-02-22 DIAGNOSIS — Z01812 Encounter for preprocedural laboratory examination: Secondary | ICD-10-CM | POA: Insufficient documentation

## 2019-02-22 LAB — COMPREHENSIVE METABOLIC PANEL
ALT: 57 U/L — ABNORMAL HIGH (ref 0–44)
AST: 40 U/L (ref 15–41)
Albumin: 4.4 g/dL (ref 3.5–5.0)
Alkaline Phosphatase: 43 U/L (ref 38–126)
Anion gap: 7 (ref 5–15)
BUN: 19 mg/dL (ref 6–20)
CO2: 21 mmol/L — ABNORMAL LOW (ref 22–32)
Calcium: 9.1 mg/dL (ref 8.9–10.3)
Chloride: 113 mmol/L — ABNORMAL HIGH (ref 98–111)
Creatinine, Ser: 1.23 mg/dL (ref 0.61–1.24)
GFR calc Af Amer: >60 mL/min (ref >60)
GFR calc non Af Amer: >60 mL/min (ref >60)
Glucose, Bld: 100 mg/dL — ABNORMAL HIGH (ref 70–99)
Potassium: 4.7 mmol/L (ref 3.5–5.1)
Sodium: 141 mmol/L (ref 135–145)
Total Bilirubin: 0.5 mg/dL (ref 0.3–1.2)
Total Protein: 7.5 g/dL (ref 6.5–8.1)

## 2019-02-22 LAB — CBC WITH DIFFERENTIAL/PLATELET
Abs Immature Granulocytes: 0.02 10*3/uL (ref 0.00–0.07)
Basophils Absolute: 0.1 10*3/uL (ref 0.0–0.1)
Basophils Relative: 1 %
Eosinophils Absolute: 0.2 10*3/uL (ref 0.0–0.5)
Eosinophils Relative: 2 %
HCT: 40.8 % (ref 39.0–52.0)
Hemoglobin: 12.7 g/dL — ABNORMAL LOW (ref 13.0–17.0)
Immature Granulocytes: 0 %
Lymphocytes Relative: 48 %
Lymphs Abs: 3.6 10*3/uL (ref 0.7–4.0)
MCH: 29.6 pg (ref 26.0–34.0)
MCHC: 31.1 g/dL (ref 30.0–36.0)
MCV: 95.1 fL (ref 80.0–100.0)
Monocytes Absolute: 0.6 10*3/uL (ref 0.1–1.0)
Monocytes Relative: 7 %
Neutro Abs: 3.3 10*3/uL (ref 1.7–7.7)
Neutrophils Relative %: 42 %
Platelets: 393 10*3/uL (ref 150–400)
RBC: 4.29 MIL/uL (ref 4.22–5.81)
RDW: 13.2 % (ref 11.5–15.5)
WBC: 7.8 10*3/uL (ref 4.0–10.5)
nRBC: 0 % (ref 0.0–0.2)

## 2019-02-22 LAB — PROTIME-INR
INR: 1.2 (ref 0.8–1.2)
Prothrombin Time: 15.4 seconds — ABNORMAL HIGH (ref 11.4–15.2)

## 2019-02-22 NOTE — Progress Notes (Addendum)
Patient came to for his PAT lab appointment and seen by Konrad Felix PA, anesthesia.   Per Janett Billow , pt ok to proceed for surgery , he is stable.  Also, Janett Billow informed pt not to follow instructions given by nurse yesterday about his eliquis , to follow instructions given by whom manages his eliquis , Dr Bobby Rumpf.  LEGAL GUARDIAN DOCUMENTATION WAS SCANNED IN Epic TODAY BY ADMITTING.

## 2019-02-23 NOTE — Anesthesia Preprocedure Evaluation (Addendum)
Anesthesia Evaluation  Patient identified by MRN, date of birth, ID band Patient awake    Reviewed: Allergy & Precautions, NPO status , Patient's Chart, lab work & pertinent test results  Airway Mallampati: II  TM Distance: >3 FB Neck ROM: Full    Dental  (+) Dental Advisory Given, Edentulous Upper, Edentulous Lower   Pulmonary PE   Pulmonary exam normal breath sounds clear to auscultation       Cardiovascular hypertension, + DVT  Normal cardiovascular exam Rhythm:Regular Rate:Normal     Neuro/Psych PSYCHIATRIC DISORDERS Anxiety Depression negative neurological ROS     GI/Hepatic Neg liver ROS, GERD  ,  Endo/Other  negative endocrine ROS  Renal/GU negative Renal ROS     Musculoskeletal  (+) Arthritis ,   Abdominal   Peds  (+) ATTENTION DEFICIT DISORDER WITHOUT HYPERACTIVITY Hematology  (+) Blood dyscrasia, anemia , Plt 393k  Last dose Eliquis 02/20/2019 Took Lovenox 120mg  SQ @0900  on 02/23/2019   Anesthesia Other Findings Day of surgery medications reviewed with the patient.  Reproductive/Obstetrics                            Anesthesia Physical Anesthesia Plan  ASA: II  Anesthesia Plan: General   Post-op Pain Management:  Regional for Post-op pain   Induction: Intravenous  PONV Risk Score and Plan: 2 and Propofol infusion, Midazolam, Dexamethasone, Ondansetron and Diphenhydramine  Airway Management Planned: LMA  Additional Equipment:   Intra-op Plan:   Post-operative Plan:   Informed Consent: I have reviewed the patients History and Physical, chart, labs and discussed the procedure including the risks, benefits and alternatives for the proposed anesthesia with the patient or authorized representative who has indicated his/her understanding and acceptance.     Dental advisory given  Plan Discussed with: CRNA  Anesthesia Plan Comments:      Anesthesia Quick  Evaluation

## 2019-02-24 ENCOUNTER — Encounter (HOSPITAL_BASED_OUTPATIENT_CLINIC_OR_DEPARTMENT_OTHER)
Admission: RE | Disposition: A | Discharge status: Home / Self Care | Payer: Self-pay | Source: Home / Self Care | Attending: Specialist

## 2019-02-24 ENCOUNTER — Other Ambulatory Visit: Payer: Self-pay

## 2019-02-24 ENCOUNTER — Encounter (HOSPITAL_BASED_OUTPATIENT_CLINIC_OR_DEPARTMENT_OTHER): Payer: Self-pay | Admitting: Specialist

## 2019-02-24 ENCOUNTER — Ambulatory Visit (HOSPITAL_BASED_OUTPATIENT_CLINIC_OR_DEPARTMENT_OTHER): Payer: Medicare HMO | Admitting: Physician Assistant

## 2019-02-24 ENCOUNTER — Observation Stay (HOSPITAL_BASED_OUTPATIENT_CLINIC_OR_DEPARTMENT_OTHER)
Admission: RE | Admit: 2019-02-24 | Discharge: 2019-02-25 | Disposition: A | Discharge status: Home / Self Care | Payer: Medicare HMO | Attending: Specialist | Admitting: Specialist

## 2019-02-24 ENCOUNTER — Ambulatory Visit (HOSPITAL_BASED_OUTPATIENT_CLINIC_OR_DEPARTMENT_OTHER): Payer: Medicare HMO | Admitting: Anesthesiology

## 2019-02-24 DIAGNOSIS — E7800 Pure hypercholesterolemia, unspecified: Secondary | ICD-10-CM | POA: Diagnosis not present

## 2019-02-24 DIAGNOSIS — M1712 Unilateral primary osteoarthritis, left knee: Secondary | ICD-10-CM

## 2019-02-24 DIAGNOSIS — Z87891 Personal history of nicotine dependence: Secondary | ICD-10-CM | POA: Insufficient documentation

## 2019-02-24 DIAGNOSIS — M1990 Unspecified osteoarthritis, unspecified site: Secondary | ICD-10-CM | POA: Diagnosis not present

## 2019-02-24 DIAGNOSIS — Z8249 Family history of ischemic heart disease and other diseases of the circulatory system: Secondary | ICD-10-CM | POA: Insufficient documentation

## 2019-02-24 DIAGNOSIS — Z86711 Personal history of pulmonary embolism: Secondary | ICD-10-CM | POA: Diagnosis not present

## 2019-02-24 DIAGNOSIS — M2352 Chronic instability of knee, left knee: Secondary | ICD-10-CM | POA: Diagnosis not present

## 2019-02-24 DIAGNOSIS — S83195A Other dislocation of left knee, initial encounter: Secondary | ICD-10-CM | POA: Insufficient documentation

## 2019-02-24 DIAGNOSIS — D72829 Elevated white blood cell count, unspecified: Secondary | ICD-10-CM | POA: Diagnosis not present

## 2019-02-24 DIAGNOSIS — E871 Hypo-osmolality and hyponatremia: Secondary | ICD-10-CM | POA: Diagnosis not present

## 2019-02-24 DIAGNOSIS — K219 Gastro-esophageal reflux disease without esophagitis: Secondary | ICD-10-CM | POA: Diagnosis not present

## 2019-02-24 DIAGNOSIS — Z86718 Personal history of other venous thrombosis and embolism: Secondary | ICD-10-CM | POA: Insufficient documentation

## 2019-02-24 DIAGNOSIS — Z981 Arthrodesis status: Secondary | ICD-10-CM | POA: Insufficient documentation

## 2019-02-24 DIAGNOSIS — D473 Essential (hemorrhagic) thrombocythemia: Secondary | ICD-10-CM | POA: Diagnosis not present

## 2019-02-24 DIAGNOSIS — Z79899 Other long term (current) drug therapy: Secondary | ICD-10-CM | POA: Insufficient documentation

## 2019-02-24 DIAGNOSIS — F329 Major depressive disorder, single episode, unspecified: Secondary | ICD-10-CM | POA: Insufficient documentation

## 2019-02-24 DIAGNOSIS — E785 Hyperlipidemia, unspecified: Secondary | ICD-10-CM | POA: Insufficient documentation

## 2019-02-24 DIAGNOSIS — F909 Attention-deficit hyperactivity disorder, unspecified type: Secondary | ICD-10-CM | POA: Insufficient documentation

## 2019-02-24 DIAGNOSIS — X58XXXA Exposure to other specified factors, initial encounter: Secondary | ICD-10-CM | POA: Insufficient documentation

## 2019-02-24 DIAGNOSIS — Z7901 Long term (current) use of anticoagulants: Secondary | ICD-10-CM | POA: Insufficient documentation

## 2019-02-24 DIAGNOSIS — I10 Essential (primary) hypertension: Secondary | ICD-10-CM | POA: Insufficient documentation

## 2019-02-24 DIAGNOSIS — F419 Anxiety disorder, unspecified: Secondary | ICD-10-CM | POA: Insufficient documentation

## 2019-02-24 DIAGNOSIS — Z885 Allergy status to narcotic agent status: Secondary | ICD-10-CM | POA: Insufficient documentation

## 2019-02-24 HISTORY — DX: Unilateral primary osteoarthritis, left knee: M17.12

## 2019-02-24 HISTORY — PX: TOTAL KNEE ARTHROPLASTY: SHX125

## 2019-02-24 LAB — POCT I-STAT, CHEM 8
BUN: 16 mg/dL (ref 6–20)
Calcium, Ion: 1.17 mmol/L (ref 1.15–1.40)
Chloride: 117 mmol/L — ABNORMAL HIGH (ref 98–111)
Creatinine, Ser: 1 mg/dL (ref 0.61–1.24)
Glucose, Bld: 86 mg/dL (ref 70–99)
HCT: 38 % — ABNORMAL LOW (ref 39.0–52.0)
Hemoglobin: 12.9 g/dL — ABNORMAL LOW (ref 13.0–17.0)
Potassium: 4.1 mmol/L (ref 3.5–5.1)
Sodium: 143 mmol/L (ref 135–145)
TCO2: 18 mmol/L — ABNORMAL LOW (ref 22–32)

## 2019-02-24 SURGERY — ARTHROPLASTY, KNEE, TOTAL
Anesthesia: General | Site: Knee | Laterality: Left

## 2019-02-24 MED ORDER — ACETAMINOPHEN 500 MG PO TABS
1000.0000 mg | ORAL_TABLET | Freq: Four times a day (QID) | ORAL | Status: AC
  Administered 2019-02-24 – 2019-02-25 (×4): 1000 mg via ORAL
  Filled 2019-02-24: qty 2

## 2019-02-24 MED ORDER — TRAMADOL HCL 50 MG PO TABS
ORAL_TABLET | ORAL | Status: AC
  Filled 2019-02-24: qty 1

## 2019-02-24 MED ORDER — PROPOFOL 500 MG/50ML IV EMUL
INTRAVENOUS | Status: DC | PRN
  Administered 2019-02-24: 120 mg via INTRAVENOUS

## 2019-02-24 MED ORDER — CLONIDINE HCL (ANALGESIA) 100 MCG/ML EP SOLN
EPIDURAL | Status: DC | PRN
  Administered 2019-02-24: 100 ug

## 2019-02-24 MED ORDER — CEFAZOLIN SODIUM-DEXTROSE 2-4 GM/100ML-% IV SOLN
INTRAVENOUS | Status: AC
  Filled 2019-02-24: qty 100

## 2019-02-24 MED ORDER — GABAPENTIN 300 MG PO CAPS
ORAL_CAPSULE | ORAL | Status: AC
  Filled 2019-02-24: qty 1

## 2019-02-24 MED ORDER — CHLORHEXIDINE GLUCONATE 4 % EX LIQD
60.0000 mL | Freq: Once | CUTANEOUS | Status: DC
  Filled 2019-02-24: qty 118

## 2019-02-24 MED ORDER — HYDROMORPHONE HCL 1 MG/ML IJ SOLN
INTRAMUSCULAR | Status: DC | PRN
  Administered 2019-02-24: .2 mg via INTRAVENOUS
  Administered 2019-02-24: .4 mg via INTRAVENOUS

## 2019-02-24 MED ORDER — BISACODYL 5 MG PO TBEC
5.0000 mg | DELAYED_RELEASE_TABLET | Freq: Every day | ORAL | Status: DC | PRN
  Filled 2019-02-24: qty 1

## 2019-02-24 MED ORDER — ACETAMINOPHEN 500 MG PO TABS
ORAL_TABLET | ORAL | Status: AC
  Filled 2019-02-24: qty 2

## 2019-02-24 MED ORDER — EPHEDRINE 5 MG/ML INJ
INTRAVENOUS | Status: AC
  Filled 2019-02-24: qty 10

## 2019-02-24 MED ORDER — CEFAZOLIN SODIUM-DEXTROSE 2-4 GM/100ML-% IV SOLN
2.0000 g | INTRAVENOUS | Status: AC
  Administered 2019-02-24: 08:00:00 2 g via INTRAVENOUS
  Filled 2019-02-24: qty 100

## 2019-02-24 MED ORDER — MIDAZOLAM HCL 2 MG/2ML IJ SOLN
INTRAMUSCULAR | Status: AC
  Filled 2019-02-24: qty 2

## 2019-02-24 MED ORDER — METHOCARBAMOL 1000 MG/10ML IJ SOLN
500.0000 mg | Freq: Four times a day (QID) | INTRAVENOUS | Status: DC | PRN
  Filled 2019-02-24: qty 5

## 2019-02-24 MED ORDER — LIDOCAINE 2% (20 MG/ML) 5 ML SYRINGE
INTRAMUSCULAR | Status: DC | PRN
  Administered 2019-02-24: 60 mg via INTRAVENOUS

## 2019-02-24 MED ORDER — TOPIRAMATE 100 MG PO TABS
100.0000 mg | ORAL_TABLET | Freq: Every day | ORAL | Status: DC
  Administered 2019-02-24: 100 mg via ORAL
  Filled 2019-02-24: qty 1

## 2019-02-24 MED ORDER — PROMETHAZINE HCL 25 MG/ML IJ SOLN
6.2500 mg | INTRAMUSCULAR | Status: DC | PRN
  Filled 2019-02-24: qty 1

## 2019-02-24 MED ORDER — CEFAZOLIN SODIUM-DEXTROSE 1-4 GM/50ML-% IV SOLN
INTRAVENOUS | Status: AC
  Filled 2019-02-24: qty 50

## 2019-02-24 MED ORDER — HYDROMORPHONE HCL 2 MG/ML IJ SOLN
INTRAMUSCULAR | Status: AC
  Filled 2019-02-24: qty 1

## 2019-02-24 MED ORDER — ONDANSETRON HCL 4 MG/2ML IJ SOLN
INTRAMUSCULAR | Status: DC | PRN
  Administered 2019-02-24: 4 mg via INTRAVENOUS

## 2019-02-24 MED ORDER — SODIUM CHLORIDE 0.9 % IR SOLN
Status: DC | PRN
  Administered 2019-02-24: 3000 mL

## 2019-02-24 MED ORDER — METHOCARBAMOL 500 MG PO TABS
500.0000 mg | ORAL_TABLET | Freq: Four times a day (QID) | ORAL | Status: DC | PRN
  Filled 2019-02-24: qty 1

## 2019-02-24 MED ORDER — SENNOSIDES-DOCUSATE SODIUM 8.6-50 MG PO TABS
1.0000 | ORAL_TABLET | Freq: Every evening | ORAL | Status: DC | PRN
  Filled 2019-02-24: qty 1

## 2019-02-24 MED ORDER — TAMSULOSIN HCL 0.4 MG PO CAPS
0.4000 mg | ORAL_CAPSULE | Freq: Two times a day (BID) | ORAL | Status: DC
  Administered 2019-02-24: 0.4 mg via ORAL
  Filled 2019-02-24: qty 1

## 2019-02-24 MED ORDER — ONDANSETRON HCL 4 MG PO TABS
4.0000 mg | ORAL_TABLET | Freq: Four times a day (QID) | ORAL | Status: DC | PRN
  Filled 2019-02-24: qty 1

## 2019-02-24 MED ORDER — POVIDONE-IODINE 10 % EX SWAB
2.0000 "application " | Freq: Once | CUTANEOUS | Status: DC
  Filled 2019-02-24: qty 2

## 2019-02-24 MED ORDER — MIDAZOLAM HCL 2 MG/2ML IJ SOLN
2.0000 mg | Freq: Once | INTRAMUSCULAR | Status: AC
  Administered 2019-02-24: 2 mg via INTRAVENOUS
  Filled 2019-02-24: qty 2

## 2019-02-24 MED ORDER — DEXAMETHASONE SODIUM PHOSPHATE 10 MG/ML IJ SOLN
INTRAMUSCULAR | Status: AC
  Filled 2019-02-24: qty 1

## 2019-02-24 MED ORDER — ATORVASTATIN CALCIUM 40 MG PO TABS
40.0000 mg | ORAL_TABLET | Freq: Every day | ORAL | Status: DC
  Administered 2019-02-24: 40 mg via ORAL
  Filled 2019-02-24: qty 1

## 2019-02-24 MED ORDER — APIXABAN 5 MG PO TABS
5.0000 mg | ORAL_TABLET | Freq: Two times a day (BID) | ORAL | Status: DC
  Administered 2019-02-24: 5 mg via ORAL
  Filled 2019-02-24: qty 1

## 2019-02-24 MED ORDER — METHOCARBAMOL 500 MG PO TABS: 500.0000 mg | ORAL_TABLET | Freq: Four times a day (QID) | ORAL | 0 refills | Status: DC

## 2019-02-24 MED ORDER — SODIUM CHLORIDE (PF) 0.9 % IJ SOLN
INTRAMUSCULAR | Status: AC
  Filled 2019-02-24: qty 10

## 2019-02-24 MED ORDER — ACETAMINOPHEN 500 MG PO TABS
1000.0000 mg | ORAL_TABLET | Freq: Once | ORAL | Status: AC
  Administered 2019-02-24: 1000 mg via ORAL
  Filled 2019-02-24: qty 2

## 2019-02-24 MED ORDER — PHENYLEPHRINE 40 MCG/ML (10ML) SYRINGE FOR IV PUSH (FOR BLOOD PRESSURE SUPPORT)
PREFILLED_SYRINGE | INTRAVENOUS | Status: AC
  Filled 2019-02-24: qty 10

## 2019-02-24 MED ORDER — LACTATED RINGERS IV SOLN
INTRAVENOUS | Status: DC
  Administered 2019-02-24: 1000 mL via INTRAVENOUS
  Filled 2019-02-24: qty 1000

## 2019-02-24 MED ORDER — HYDROMORPHONE HCL 1 MG/ML IJ SOLN
0.2500 mg | INTRAMUSCULAR | Status: DC | PRN
  Filled 2019-02-24: qty 0.5

## 2019-02-24 MED ORDER — FENTANYL CITRATE (PF) 100 MCG/2ML IJ SOLN
INTRAMUSCULAR | Status: AC
  Filled 2019-02-24: qty 2

## 2019-02-24 MED ORDER — DEXAMETHASONE SODIUM PHOSPHATE 10 MG/ML IJ SOLN
INTRAMUSCULAR | Status: DC | PRN
  Administered 2019-02-24: 10 mg via INTRAVENOUS

## 2019-02-24 MED ORDER — PROPOFOL 500 MG/50ML IV EMUL
INTRAVENOUS | Status: AC
  Filled 2019-02-24: qty 50

## 2019-02-24 MED ORDER — EPHEDRINE SULFATE-NACL 50-0.9 MG/10ML-% IV SOSY
PREFILLED_SYRINGE | INTRAVENOUS | Status: DC | PRN
  Administered 2019-02-24: 10 mg via INTRAVENOUS

## 2019-02-24 MED ORDER — CEPHALEXIN 500 MG PO CAPS: 500.0000 mg | ORAL_CAPSULE | Freq: Four times a day (QID) | ORAL | 0 refills | Status: AC

## 2019-02-24 MED ORDER — ACETAMINOPHEN 325 MG PO TABS
325.0000 mg | ORAL_TABLET | Freq: Four times a day (QID) | ORAL | Status: DC | PRN
  Filled 2019-02-24: qty 2

## 2019-02-24 MED ORDER — PROPOFOL 500 MG/50ML IV EMUL
INTRAVENOUS | Status: DC | PRN
  Administered 2019-02-24: 40 ug/kg/min via INTRAVENOUS

## 2019-02-24 MED ORDER — DIPHENHYDRAMINE HCL 50 MG/ML IJ SOLN
INTRAMUSCULAR | Status: DC | PRN
  Administered 2019-02-24: 25 mg via INTRAVENOUS

## 2019-02-24 MED ORDER — SODIUM CHLORIDE 0.9 % IV SOLN
INTRAVENOUS | Status: DC
  Filled 2019-02-24: qty 1000

## 2019-02-24 MED ORDER — ROPIVACAINE HCL 7.5 MG/ML IJ SOLN
INTRAMUSCULAR | Status: DC | PRN
  Administered 2019-02-24: 20 mL via PERINEURAL

## 2019-02-24 MED ORDER — OXYCODONE HCL 5 MG PO TABS
ORAL_TABLET | ORAL | Status: AC
  Filled 2019-02-24: qty 1

## 2019-02-24 MED ORDER — MAGNESIUM CITRATE PO SOLN
1.0000 | Freq: Once | ORAL | Status: DC | PRN
  Filled 2019-02-24: qty 296

## 2019-02-24 MED ORDER — ONDANSETRON HCL 4 MG PO TABS: 4.0000 mg | ORAL_TABLET | Freq: Three times a day (TID) | ORAL | 1 refills | Status: AC | PRN

## 2019-02-24 MED ORDER — OXYCODONE HCL 5 MG PO TABS
5.0000 mg | ORAL_TABLET | ORAL | Status: DC | PRN
  Administered 2019-02-24: 5 mg via ORAL
  Administered 2019-02-25: 10 mg via ORAL
  Filled 2019-02-24: qty 2

## 2019-02-24 MED ORDER — DIPHENHYDRAMINE HCL 50 MG/ML IJ SOLN
INTRAMUSCULAR | Status: AC
  Filled 2019-02-24: qty 1

## 2019-02-24 MED ORDER — RISPERIDONE 2 MG PO TABS
4.0000 mg | ORAL_TABLET | Freq: Every day | ORAL | Status: DC
  Administered 2019-02-24: 4 mg via ORAL
  Filled 2019-02-24: qty 2

## 2019-02-24 MED ORDER — OXYCODONE HCL 5 MG PO TABS: 5.0000 mg | ORAL_TABLET | ORAL | 0 refills | Status: DC | PRN

## 2019-02-24 MED ORDER — ONDANSETRON HCL 4 MG/2ML IJ SOLN
INTRAMUSCULAR | Status: AC
  Filled 2019-02-24: qty 2

## 2019-02-24 MED ORDER — RISPERIDONE 2 MG PO TABS
2.0000 mg | ORAL_TABLET | Freq: Every morning | ORAL | Status: DC
  Filled 2019-02-24: qty 1

## 2019-02-24 MED ORDER — HYDROMORPHONE HCL 1 MG/ML IJ SOLN
0.5000 mg | INTRAMUSCULAR | Status: DC | PRN
  Filled 2019-02-24: qty 1

## 2019-02-24 MED ORDER — AMOXICILLIN 500 MG PO CAPS
500.0000 mg | ORAL_CAPSULE | Freq: Three times a day (TID) | ORAL | Status: DC
  Administered 2019-02-24 – 2019-02-25 (×2): 500 mg via ORAL
  Filled 2019-02-24: qty 1

## 2019-02-24 MED ORDER — GABAPENTIN 300 MG PO CAPS
300.0000 mg | ORAL_CAPSULE | Freq: Three times a day (TID) | ORAL | Status: DC
  Administered 2019-02-24 (×3): 300 mg via ORAL
  Filled 2019-02-24: qty 1

## 2019-02-24 MED ORDER — DIPHENHYDRAMINE HCL 12.5 MG/5ML PO ELIX
12.5000 mg | ORAL_SOLUTION | ORAL | Status: DC | PRN
  Filled 2019-02-24: qty 10

## 2019-02-24 MED ORDER — SODIUM CHLORIDE 0.9 % IV SOLN
INTRAVENOUS | Status: DC | PRN
  Administered 2019-02-24: 10:00:00 80 mL

## 2019-02-24 MED ORDER — OXYCODONE HCL 5 MG PO TABS
10.0000 mg | ORAL_TABLET | ORAL | Status: DC | PRN
  Filled 2019-02-24: qty 3

## 2019-02-24 MED ORDER — PHENYLEPHRINE HCL (PRESSORS) 10 MG/ML IV SOLN
INTRAVENOUS | Status: AC
  Filled 2019-02-24: qty 1

## 2019-02-24 MED ORDER — CEFAZOLIN SODIUM-DEXTROSE 1-4 GM/50ML-% IV SOLN
1.0000 g | Freq: Three times a day (TID) | INTRAVENOUS | Status: AC
  Administered 2019-02-24 – 2019-02-25 (×2): 1 g via INTRAVENOUS
  Filled 2019-02-24: qty 50

## 2019-02-24 MED ORDER — ONDANSETRON HCL 4 MG/2ML IJ SOLN
4.0000 mg | Freq: Four times a day (QID) | INTRAMUSCULAR | Status: DC | PRN
  Filled 2019-02-24: qty 2

## 2019-02-24 MED ORDER — LIDOCAINE 2% (20 MG/ML) 5 ML SYRINGE
INTRAMUSCULAR | Status: AC
  Filled 2019-02-24: qty 5

## 2019-02-24 MED ORDER — TRAMADOL HCL 50 MG PO TABS
50.0000 mg | ORAL_TABLET | Freq: Four times a day (QID) | ORAL | Status: DC
  Administered 2019-02-24 – 2019-02-25 (×4): 50 mg via ORAL
  Filled 2019-02-24: qty 1

## 2019-02-24 SURGICAL SUPPLY — 121 items
59726532 ×1 IMPLANT
6118127075 ×2 IMPLANT
ANCH SUT SWLK MED 13.5X3.5 (Anchor) ×2 IMPLANT
BANDAGE ACE 6X5 VEL STRL LF (GAUZE/BANDAGES/DRESSINGS) ×6 IMPLANT
BANDAGE ELASTIC 4 VELCRO ST LF (GAUZE/BANDAGES/DRESSINGS) IMPLANT
BANDAGE ELASTIC 6 VELCRO ST LF (GAUZE/BANDAGES/DRESSINGS) IMPLANT
BANDAGE ESMARK 6X9 LF (GAUZE/BANDAGES/DRESSINGS) ×1 IMPLANT
BLADE SAW SGTL 18X1.27X75 (BLADE) ×1 IMPLANT
BLADE SAW SGTL 18X1.27X75MM (BLADE) ×1
BLADE SURG 10 STRL SS (BLADE) ×3 IMPLANT
BLADE SURG 15 STRL LF DISP TIS (BLADE) ×1 IMPLANT
BLADE SURG 15 STRL SS (BLADE) ×3
BNDG CMPR 9X6 STRL LF SNTH (GAUZE/BANDAGES/DRESSINGS) ×1
BNDG ELASTIC 6X5.8 VLCR STR LF (GAUZE/BANDAGES/DRESSINGS) ×2 IMPLANT
BNDG ESMARK 6X9 LF (GAUZE/BANDAGES/DRESSINGS) ×3
BNDG GAUZE ELAST 4 BULKY (GAUZE/BANDAGES/DRESSINGS) ×4 IMPLANT
BOWL SMART MIX CTS (DISPOSABLE) ×2 IMPLANT
BUR SURG PFJ MILL NEXGEN (Knees) IMPLANT
BURR SURG PFJ MILL NEXGEN (Knees) ×3 IMPLANT
CEMENT BONE R 1X40 (Cement) ×2 IMPLANT
CLOSURE WOUND 1/2 X4 (GAUZE/BANDAGES/DRESSINGS) ×1
COMP FEM NEXGEN SZ1 +3.5 LT (Knees) ×3 IMPLANT
COMP PATELLA 32 STD 8.5 THK (Orthopedic Implant) IMPLANT
COMPONENT FEM NEXGN SZ1 +3.5LT (Knees) IMPLANT
COVER WAND RF STERILE (DRAPES) ×3 IMPLANT
CUFF TOURN SGL QUICK 34 (TOURNIQUET CUFF)
CUFF TRNQT CYL 34X4.125X (TOURNIQUET CUFF) IMPLANT
DRAPE ARTHROSCOPY W/POUCH 114 (DRAPES) ×3 IMPLANT
DRAPE C-ARM 42X72 X-RAY (DRAPES) IMPLANT
DRAPE C-ARMOR (DRAPES) IMPLANT
DRAPE INCISE 23X17 IOBAN STRL (DRAPES) ×2
DRAPE INCISE 23X17 STRL (DRAPES) IMPLANT
DRAPE INCISE IOBAN 23X17 STRL (DRAPES) ×1 IMPLANT
DRAPE INCISE IOBAN 66X45 STRL (DRAPES) ×3 IMPLANT
DRAPE POUCH INSTRU U-SHP 10X18 (DRAPES) ×3 IMPLANT
DRAPE SHEET LG 3/4 BI-LAMINATE (DRAPES) ×3 IMPLANT
DRAPE U-SHAPE 47X51 STRL (DRAPES) ×3 IMPLANT
DRAPE UNIVERSAL PACK (DRAPES) ×2 IMPLANT
DRSG AQUACEL AG ADV 3.5X10 (GAUZE/BANDAGES/DRESSINGS) ×2 IMPLANT
DRSG PAD ABDOMINAL 8X10 ST (GAUZE/BANDAGES/DRESSINGS) ×9 IMPLANT
DURAPREP 26ML APPLICATOR (WOUND CARE) ×3 IMPLANT
ELECT NDL TIP 2.8 STRL (NEEDLE) ×1 IMPLANT
ELECT NEEDLE TIP 2.8 STRL (NEEDLE) ×3 IMPLANT
ELECT REM PT RETURN 9FT ADLT (ELECTROSURGICAL) ×3
ELECTRODE REM PT RTRN 9FT ADLT (ELECTROSURGICAL) ×1 IMPLANT
GAUZE SPONGE 4X4 12PLY STRL (GAUZE/BANDAGES/DRESSINGS) ×3 IMPLANT
GAUZE SPONGE 4X4 12PLY STRL LF (GAUZE/BANDAGES/DRESSINGS) ×2 IMPLANT
GAUZE XEROFORM 1X8 LF (GAUZE/BANDAGES/DRESSINGS) ×3 IMPLANT
GENDER SOLUTION ×1 IMPLANT
GLOVE BIO SURGEON STRL SZ7.5 (GLOVE) ×3 IMPLANT
GLOVE BIO SURGEON STRL SZ8 (GLOVE) ×3 IMPLANT
GLOVE INDICATOR 8.0 STRL GRN (GLOVE) ×6 IMPLANT
GOWN STRL REUS W/TWL LRG LVL3 (GOWN DISPOSABLE) ×3 IMPLANT
GOWN STRL REUS W/TWL XL LVL3 (GOWN DISPOSABLE) ×3 IMPLANT
GRAFT ROPE FROZEN (Tissue) ×3 IMPLANT
GRAFT TISS ROPE SEMITEND 4-5.5 (Tissue) IMPLANT
Gender Solutions Patello-Femoral Joint System ×2 IMPLANT
HANDPIECE INTERPULSE COAX TIP (DISPOSABLE) ×3
IMMOBILIZER KNEE 22 UNIV (SOFTGOODS) ×2 IMPLANT
IMPL SWIVELOCK 3.5X13.5 (Anchor) IMPLANT
IMPLANT SWIVELOCK 3.5X13.5 (Anchor) ×6 IMPLANT
IV NS IRRIG 3000ML ARTHROMATIC (IV SOLUTION) ×3 IMPLANT
JET LAVAGE IRRISEPT WOUND (IRRIGATION / IRRIGATOR) ×3
KIT BIO-TENODESIS 3X8 DISP (MISCELLANEOUS)
KIT INSRT BABSR STRL DISP BTN (MISCELLANEOUS) IMPLANT
KIT TRANSTIBIAL (DISPOSABLE) IMPLANT
KIT TURNOVER CYSTO (KITS) ×3 IMPLANT
LAVAGE JET IRRISEPT WOUND (IRRIGATION / IRRIGATOR) IMPLANT
MANIFOLD NEPTUNE II (INSTRUMENTS) ×3 IMPLANT
NEEDLE HYPO 22GX1.5 SAFETY (NEEDLE) ×3 IMPLANT
NS IRRIG 1000ML POUR BTL (IV SOLUTION) ×3 IMPLANT
PACK ARTHROSCOPY DSU (CUSTOM PROCEDURE TRAY) ×3 IMPLANT
PACK BASIN DAY SURGERY FS (CUSTOM PROCEDURE TRAY) ×3 IMPLANT
PAD ABD 8X10 STRL (GAUZE/BANDAGES/DRESSINGS) ×2 IMPLANT
PAD ARMBOARD 7.5X6 YLW CONV (MISCELLANEOUS) ×6 IMPLANT
PAD CAST 4YDX4 CTTN HI CHSV (CAST SUPPLIES) ×1 IMPLANT
PADDING CAST ABS 4INX4YD NS (CAST SUPPLIES) ×2
PADDING CAST ABS COTTON 4X4 ST (CAST SUPPLIES) ×1 IMPLANT
PADDING CAST COTTON 4X4 STRL (CAST SUPPLIES) ×3
PADDING CAST COTTON 6X4 STRL (CAST SUPPLIES) ×9 IMPLANT
PASSER SUT SWANSON 36MM LOOP (INSTRUMENTS) ×3 IMPLANT
PATELLA ZIMMER 32MM (Orthopedic Implant) ×3 IMPLANT
PENCIL BUTTON HOLSTER BLD 10FT (ELECTRODE) ×3 IMPLANT
PROBE BIPOLAR ATHRO 135MM 90D (MISCELLANEOUS) IMPLANT
SCREW PEEK INT. 7X30 (Screw) ×2 IMPLANT
SET HNDPC FAN SPRY TIP SCT (DISPOSABLE) IMPLANT
SPONGE LAP 18X18 RF (DISPOSABLE) ×3 IMPLANT
SPONGE LAP 4X18 RFD (DISPOSABLE) ×3 IMPLANT
STOCKINETTE 6  STRL (DRAPES) ×2
STOCKINETTE 6 STRL (DRAPES) IMPLANT
STRIP CLOSURE SKIN 1/2X4 (GAUZE/BANDAGES/DRESSINGS) ×2 IMPLANT
SUCTION FRAZIER HANDLE 10FR (MISCELLANEOUS) ×2
SUCTION TUBE FRAZIER 10FR DISP (MISCELLANEOUS) ×1 IMPLANT
SUT 2 FIBERLOOP 20 STRT BLUE (SUTURE) ×9
SUT ETHILON 4 0 PS 2 18 (SUTURE) ×3 IMPLANT
SUT FIBERWIRE #2 38 REV NDL BL (SUTURE)
SUT FIBERWIRE #2 38 T-5 BLUE (SUTURE) ×6
SUT MNCRL AB 3-0 PS2 18 (SUTURE) ×3 IMPLANT
SUT VIC AB 0 CT1 27 (SUTURE) ×3
SUT VIC AB 0 CT1 27XBRD ANBCTR (SUTURE) ×1 IMPLANT
SUT VIC AB 0 CT1 36 (SUTURE) ×6 IMPLANT
SUT VIC AB 1 CT1 27 (SUTURE) ×6
SUT VIC AB 1 CT1 27XBRD ANBCTR (SUTURE) ×1 IMPLANT
SUT VIC AB 1 CT1 27XBRD ANTBC (SUTURE) ×1 IMPLANT
SUT VIC AB 2-0 CT1 27 (SUTURE) ×3
SUT VIC AB 2-0 CT1 TAPERPNT 27 (SUTURE) ×1 IMPLANT
SUT VIC AB 2-0 SH 27 (SUTURE) ×3
SUT VIC AB 2-0 SH 27XBRD (SUTURE) ×1 IMPLANT
SUTURE 2 FIBERLOOP 20 STRT BLU (SUTURE) ×1 IMPLANT
SUTURE FIBERWR #2 38 T-5 BLUE (SUTURE) ×2 IMPLANT
SUTURE FIBERWR#2 38 REV NDL BL (SUTURE) IMPLANT
SYR 20ML LL LF (SYRINGE) ×3 IMPLANT
SYR BULB IRRIGATION 50ML (SYRINGE) IMPLANT
SYR CONTROL 10ML LL (SYRINGE) ×3 IMPLANT
TOWEL OR 17X26 10 PK STRL BLUE (TOWEL DISPOSABLE) ×6 IMPLANT
TUBE CONNECTING 12'X1/4 (SUCTIONS) ×1
TUBE CONNECTING 12X1/4 (SUCTIONS) ×2 IMPLANT
WAND HAND CNTRL MULTIVAC 90 (MISCELLANEOUS) IMPLANT
WATER STERILE IRR 500ML POUR (IV SOLUTION) ×3 IMPLANT
WRAP KNEE MAXI GEL POST OP (GAUZE/BANDAGES/DRESSINGS) ×2 IMPLANT
YANKAUER SUCT BULB TIP NO VENT (SUCTIONS) IMPLANT

## 2019-02-24 NOTE — Anesthesia Procedure Notes (Signed)
Procedure Name: LMA Insertion Date/Time: 02/24/2019 7:52 AM Performed by: Gerald Leitz, CRNA Pre-anesthesia Checklist: Patient identified, Patient being monitored, Timeout performed, Emergency Drugs available and Suction available Patient Re-evaluated:Patient Re-evaluated prior to induction Oxygen Delivery Method: Circle system utilized Preoxygenation: Pre-oxygenation with 100% oxygen Induction Type: IV induction Ventilation: Mask ventilation without difficulty LMA: LMA inserted LMA Size: 4.0 Tube type: Oral Number of attempts: 1 Placement Confirmation: positive ETCO2 and breath sounds checked- equal and bilateral Tube secured with: Tape Dental Injury: Teeth and Oropharynx as per pre-operative assessment

## 2019-02-24 NOTE — Discharge Instructions (Signed)
Post Anesthesia Home Care Instructions  Activity: Get plenty of rest for the remainder of the day. A responsible individual must stay with you for 24 hours following the procedure.  For the next 24 hours, DO NOT: -Drive a car -Operate machinery -Drink alcoholic beverages -Take any medication unless instructed by your physician -Make any legal decisions or sign important papers.  Meals: Start with liquid foods such as gelatin or soup. Progress to regular foods as tolerated. Avoid greasy, spicy, heavy foods. If nausea and/or vomiting occur, drink only clear liquids until the nausea and/or vomiting subsides. Call your physician if vomiting continues.  Special Instructions/Symptoms: Your throat may feel dry or sore from the anesthesia or the breathing tube placed in your throat during surgery. If this causes discomfort, gargle with warm salt water. The discomfort should disappear within 24 hours.      Regional Anesthesia Blocks  1. Numbness or the inability to move the "blocked" extremity may last from 3-48 hours after placement. The length of time depends on the medication injected and your individual response to the medication. If the numbness is not going away after 48 hours, call your surgeon.  2. The extremity that is blocked will need to be protected until the numbness is gone and the  Strength has returned. Because you cannot feel it, you will need to take extra care to avoid injury. Because it may be weak, you may have difficulty moving it or using it. You may not know what position it is in without looking at it while the block is in effect.  3. For blocks in the legs and feet, returning to weight bearing and walking needs to be done carefully. You will need to wait until the numbness is entirely gone and the strength has returned. You should be able to move your leg and foot normally before you try and bear weight or walk. You will need someone to be with you when you first try to  ensure you do not fall and possibly risk injury.  4. Bruising and tenderness at the needle site are common side effects and will resolve in a few days.  5. Persistent numbness or new problems with movement should be communicated to the surgeon or the [facility] ([phone])/ [facility] ([phone]).Information for Discharge Teaching: EXPAREL (bupivacaine liposome injectable suspension)   Your surgeon or anesthesiologist gave you EXPAREL(bupivacaine) to help control your pain after surgery.   EXPAREL is a local anesthetic that provides pain relief by numbing the tissue around the surgical site.  EXPAREL is designed to release pain medication over time and can control pain for up to 72 hours.  Depending on how you respond to EXPAREL, you may require less pain medication during your recovery.  Possible side effects:  Temporary loss of sensation or ability to move in the area where bupivacaine was injected.  Nausea, vomiting, constipation  Rarely, numbness and tingling in your mouth or lips, lightheadedness, or anxiety may occur.  Call your doctor right away if you think you may be experiencing any of these sensations, or if you have other questions regarding possible side effects.  Follow all other discharge instructions given to you by your surgeon or nurse. Eat a healthy diet and drink plenty of water or other fluids.  If you return to the hospital for any reason within 96 hours following the administration of EXPAREL, it is important for health care providers to know that you have received this anesthetic. A   teal colored band has been placed on your arm with the date, time and amount of EXPAREL you have received in order to alert and inform your health care providers. Please leave this armband in place for the full 96 hours following administration, and then you may remove the band. 

## 2019-02-24 NOTE — Interval H&P Note (Signed)
History and Physical Interval Note:  02/24/2019 7:42 AM  Andre Cervantes  has presented today for surgery, with the diagnosis of Left knee patella maltracking  patellafemoral osteoarthritis.  The various methods of treatment have been discussed with the patient and family. After consideration of risks, benefits and other options for treatment, the patient has consented to  Procedure(s) with comments: Knee allograft MPFL reconstruction patellofemoral arthroplasty (Left) - adductor canal as a surgical intervention.  The patient's history has been reviewed, patient examined, no change in status, stable for surgery.  I have reviewed the patient's chart and labs.  Questions were answered to the patient's satisfaction.     Alicyn Klann ANDREW

## 2019-02-24 NOTE — Evaluation (Addendum)
Physical Therapy Evaluation Patient Details Name: Andre Cervantes MRN: [id number] DOB: 1985-03-25 Today's Date: 02/24/2019   History of Present Illness  Pt s/p R patellofemoral arthroplasty with allograft and patellofemoral ligament reconstruction  Clinical Impression  Pt admitted as above and presenting with functional mobility limitations 2* decreased R LE strength/ROM, and post op discomfort.  Pt is currently mobilizing with RW or crutches at min guard/supervision level and plans dc home with assist of parents tomorrow am.    Follow Up Recommendations No PT follow up    Equipment Recommendations  Crutches    Recommendations for Other Services       Precautions / Restrictions Precautions Precautions: Fall Restrictions Weight Bearing Restrictions: Yes LLE Weight Bearing: Weight bearing as tolerated Other Position/Activity Restrictions: WBAT with KI in place; NO ROM at KNEE     Mobility  Bed Mobility Overal bed mobility: Modified Independent             General bed mobility comments: unassisted sit<>supine  Transfers Overall transfer level: Needs assistance Equipment used: Rolling walker (2 wheeled);Crutches Transfers: Sit to/from Stand Sit to Stand: Supervision         General transfer comment: for safety  Ambulation/Gait Ambulation/Gait assistance: Min guard;Supervision Gait Distance (Feet): 600 Feet Assistive device: Rolling walker (2 wheeled);Crutches Gait Pattern/deviations: Step-to pattern;Step-through pattern;Shuffle Gait velocity: decr   General Gait Details: Pt ambulated 300; with RW and requested to attempt crutches - ambulated additional 300'.  Min cues for posture and position from AD  Stairs Stairs: (Reviewed verbally and pt familiar with sequence - bil rails )          Wheelchair Mobility    Modified Rankin (Stroke Patients Only)       Balance Overall balance assessment: Mild deficits observed, not formally tested                                            Pertinent Vitals/Pain Pain Assessment: No/denies pain    Home Living Family/patient expects to be discharged to:: Private residence Living Arrangements: Parent Available Help at Discharge: Family Type of Home: House Home Access: Stairs to enter Entrance Stairs-Rails: Right;Left;Can reach both Technical brewer of Steps: 4 Home Layout: One level Home Equipment: Environmental consultant - 2 wheels      Prior Function Level of Independence: Independent               Hand Dominance        Extremity/Trunk Assessment   Upper Extremity Assessment Upper Extremity Assessment: Overall WFL for tasks assessed    Lower Extremity Assessment Lower Extremity Assessment: RLE deficits/detail RLE Deficits / Details: R knee in [place].  Pt able to lift LE with KI in place unassisted    Cervical / Trunk Assessment Cervical / Trunk Assessment: Normal  Communication   Communication: No difficulties  Cognition Arousal/Alertness: Awake/alert Behavior During Therapy: WFL for tasks assessed/performed Overall Cognitive Status: Within Functional Limits for tasks assessed                                        General Comments      Exercises General Exercises - Lower Extremity Ankle Circles/Pumps: AROM;15 reps;Supine;Both   Assessment/Plan    PT Assessment Patent does not need any further PT services  PT Problem List  Decreased strength;Decreased range of motion;Decreased activity tolerance;Decreased balance;Decreased mobility;Decreased knowledge of use of DME       PT Treatment Interventions      PT Goals (Current goals can be found in the Care Plan section)  Acute Rehab PT Goals Patient Stated Goal: Regain IND PT Goal Formulation: All assessment and education complete, DC therapy    Frequency Min 1X/week   Barriers to discharge        Co-evaluation               AM-PAC PT "6 Clicks" Mobility  Outcome Measure  Help needed turning from your back to your side while in a flat bed without using bedrails?: None Help needed moving from lying on your back to sitting on the side of a flat bed without using bedrails?: None Help needed moving to and from a bed to a chair (including a wheelchair)?: A Little Help needed standing up from a chair using your arms (e.g., wheelchair or bedside chair)?: A Little Help needed to walk in hospital room?: A Little Help needed climbing 3-5 steps with a railing? : A Little 6 Click Score: 20    End of Session Equipment Utilized During Treatment: Gait belt Activity Tolerance: Patient tolerated treatment well Patient left: in bed;with call bell/phone within reach;with bed alarm set Nurse Communication: Mobility status PT Visit Diagnosis: Difficulty in walking, not elsewhere classified (R26.2)    Time: [id number] PT Time Calculation (min) (ACUTE ONLY): 27 min   Charges:   PT Evaluation $PT Eval Low Complexity: [place] Pager [phone] Office [phone]   Gracianna Vink 02/24/2019, 4:37 PM

## 2019-02-24 NOTE — Transfer of Care (Signed)
Immediate Anesthesia Transfer of Care Note  Patient: Andre Cervantes  Procedure(s) Performed: Procedure(s) with comments: Knee allograft MPFL reconstruction patellofemoral arthroplasty (Left) - adductor canal  Patient Location: PACU  Anesthesia Type:General  Level of Consciousness: Alert, Awake, Oriented  Airway & Oxygen Therapy: Patient Spontanous Breathing  Post-op Assessment: Report given to RN  Post vital signs: Reviewed and stable  Last Vitals:  Vitals:   02/24/19 0730 02/24/19 0735  BP: 106/70   Pulse: (!) 55 (!) 55  Resp: 15 16  Temp:    SpO2: 123XX123 [id number]    Complications: No apparent anesthesia complications

## 2019-02-24 NOTE — Op Note (Signed)
Dictated# [id number]

## 2019-02-24 NOTE — Anesthesia Procedure Notes (Signed)
Anesthesia Regional Block: Adductor canal block   Pre-Anesthetic Checklist: ,, timeout performed, Correct Patient, Correct Site, Correct Laterality, Correct Procedure, Correct Position, site marked, Risks and benefits discussed,  Surgical consent,  Pre-op evaluation,  At surgeon's request and post-op pain management  Laterality: Left  Prep: chloraprep       Needles:  Injection technique: Single-shot  Needle Type: Echogenic Needle     Needle Length: 9cm  Needle Gauge: 21     Additional Needles:   Procedures:,,,, ultrasound used (permanent image in chart),,,,  Narrative:  Start time: 02/24/2019 7:10 AM End time: 02/24/2019 7:15 AM Injection made incrementally with aspirations every 5 mL.  Performed by: Personally  Anesthesiologist: Catalina Gravel, MD  Additional Notes: No pain on injection. No increased resistance to injection. Injection made in 5cc increments.  Good needle visualization.  Patient tolerated procedure well.

## 2019-02-24 NOTE — Anesthesia Postprocedure Evaluation (Signed)
Anesthesia Post Note  Patient: SEANPAUL MCGHIE  Procedure(s) Performed: Knee allograft MPFL reconstruction patellofemoral arthroplasty (Left Knee)     Patient location during evaluation: PACU Anesthesia Type: General Level of consciousness: awake and alert Pain management: pain level controlled Vital Signs Assessment: post-procedure vital signs reviewed and stable Respiratory status: spontaneous breathing, nonlabored ventilation and respiratory function stable Cardiovascular status: blood pressure returned to baseline and stable Postop Assessment: no apparent nausea or vomiting Anesthetic complications: no    Last Vitals:  Vitals:   02/24/19 1152 02/24/19 1200  BP:  109/73  Pulse: 72 69  Resp: 15 16  Temp:    SpO2: 100% 99%    Last Pain:  Vitals:   02/24/19 1145  TempSrc:   PainSc: 0-No pain                 Catalina Gravel

## 2019-02-24 NOTE — Progress Notes (Signed)
AssistedDr. Turk with left, ultrasound guided, adductor canal block. Side rails up, monitors on throughout procedure. See vital signs in flow sheet. Tolerated Procedure well.  

## 2019-02-24 NOTE — Op Note (Signed)
NAME: Andre Cervantes, KOCHANSKI MEDICAL RECORD [id number] ACCOUNT [id number] DATE OF BIRTH:[birth year] FACILITY: [facility] LOCATION: [facility] PHYSICIAN:Denissa Cozart Gwinda Passe, MD  OPERATIVE REPORT  DATE OF PROCEDURE:  02/24/2019  PREOPERATIVE DIAGNOSES: 1.  Left knee patellofemoral osteoarthritis. 2.  Patellofemoral instability with lateral dislocation, incompetent medial patellofemoral ligament.  POSTOPERATIVE DIAGNOSES:   1.  Left knee patellofemoral osteoarthritis. 2.  Patellofemoral instability with lateral dislocation, incompetent medial patellofemoral ligament.  PROCEDURE: 1.  Left knee patellofemoral arthroplasty utilizing Zimmer [employer] Solution patellofemoral joint system. 2.  Allograft, medial patellofemoral ligament reconstruction.  SURGEON:  Hart Robinsons, MD  ASSISTANT:  Joslyn Devon Hoss, PA-C  ANESTHESIA:  Adductor canal with general.  ESTIMATED BLOOD LOSS:  Minimal.  DRAINS:  None.  COMPLICATIONS:  None.  TOURNIQUET TIME:  1 hour and 14 minutes at 300 mmHg.  DISPOSITION:  PACU, stable.  IMPLANTS:  Zimmer size 1 left, 32 patella, cemented, 7 mm Arthrex F-graft allograft.  OPERATIVE DETAILS:  The patient was counseled in the holding area, correct site identified and marked and signed appropriately.  IV started.  Sedation was given.  A block was administered.  IV antibiotics were given within 1 hour of the incision.  He was  then taken to the operating room and placed in supine position under general anesthesia.  Left lower extremity elevated.  He was then prepped with DuraPrep and draped in sterile fashion.  Time-out done, confirming the left side, exsanguinated with an  Esmarch, tourniquet inflated to 300 mmHg.  A medial parapatellar arthrotomy was performed utilizing the old previous incision from the medial reefing which was done multiple years ago.  Dissection was carried through the skin and subcutaneous tissue.   Skin flaps were developed.  Medial parapatellar arthrotomy  was performed preserving the other articular cartilage.  The patella was everted and found to be a size 32.  9 mm of bone was resected, locking holes were made and a shim was placed.  Knee was  flexed.  End-stage arthritis changes of patella, femoral trochlea had been determined.  Starter hole made into the distal femur canal was irrigated.  The effluent was clear.  Intramedullary wire was placed.  The cutting block was applied.  Whitesides  line and epicondylar axis was identified with 30 degrees of external rotation.  The cutting guide was set to cut flush on the anterior femoral cortex, making sure not to notch it.  It was set appropriately.   Screws were applied and the anterior cortex  was cut.  This looked perfect.  At this time, we sized and found to be a size 1.  The #1 guide was placed, drilled and cut appropriately and then the 2nd one.  Following this, then the area was burred with the 3rd cutting block.  Trial was utilized and  found to be excellent position and size #1 left with a trial button of 32.  We had nice tracking at this point.  Trials were removed utilizing pulsatile lavage and Moder cement technique, all components were cemented into place.  After cement had cured,  I tracked the knee.  There was no bumping.  The patella glided nice and smoothly at this point.  The synovium was closed with a running Vicryl suture.  The medial border of the patella was identified.  Two guide pins were placed over reaming with a 4 mm  reamer.  We had chosen an age and size appropriate Arthrex LifeNet F-type allograft.  Locking suture had been placed on both ends utilizing 3.5 SwiveLocks.  The graft was placed in the patella at the anatomic location of the normal medial patellofemoral  ligament.  This was done being very cautious with the patella itself as it had been previously cemented into place.  There were no complications or problems.  We avoided the patella totally in the cement.  Following this, I  identified the Schatzki's  point line between the adductor tubercle and the medial femoral epicondyle.  A small opening was made in the periosteum.  MCL was protected.  The guidepin was drilled anterior, proximal, and out lateral percutaneously, overreamed with a 7 mm reamer.   Guide pin was placed.  I then pulled the graft into the socket with a suture at 30 degrees of flexion.  Peak interference screw was placed in the femoral socket.  Anatomically reduced the patella at 30 degrees of flexion.  The knee was put through range  of motion.  At full extension, the patella could be subluxed to 50% lateral.  Did not sublux medial.  At 30 degrees of flexion it was locked into the groove and with continued flexion, the graft became tighter, but there was no medial subluxation and the  patella tracked anatomically.  The arthrotomy was then closed with interrupted Vicryl sutures.  Also, each layer was irrigated with Irricept during the closure.  Subcutaneous closed with Vicryl.  Tourniquet was deflated.  Normal circulation.  Skin was closed with a running  subcuticular Monocryl suture.  Dermabond was applied.  Dressing and compression and ice and a knee immobilizer.  The patient tolerated the procedure well.  There were no complications or problems.  He was taken to the operating room PACU in stable  condition.  He will resume his Eliquis tonight.  He will be watched overnight.  He will be discharged home when stable.  He will be seen back in office in a couple of weeks.  He will be allowed weightbearing as tolerated and will start him on physio  therapy and PF protocol in a couple of weeks.  All questions encouraged and answered the patient's family via telephone after this utilizing COVID-19 guidelines.  To help with patient positioning, prepping, draping, technical surgical assistant throughout the entire case, graft preparation, cementing, wound closure, application of dressing, Ms. Almedia Balls' assistance  was needed.  CN/NUANCE  D:02/24/2019 T:02/24/2019 JOB:[id number]

## 2019-02-25 DIAGNOSIS — M1712 Unilateral primary osteoarthritis, left knee: Secondary | ICD-10-CM | POA: Diagnosis not present

## 2019-02-25 MED ORDER — OXYCODONE HCL 5 MG PO TABS
ORAL_TABLET | ORAL | Status: AC
  Filled 2019-02-25: qty 2

## 2019-02-25 MED ORDER — TRAMADOL HCL 50 MG PO TABS
ORAL_TABLET | ORAL | Status: AC
  Filled 2019-02-25: qty 1

## 2019-02-25 MED ORDER — ACETAMINOPHEN 500 MG PO TABS
ORAL_TABLET | ORAL | Status: AC
  Filled 2019-02-25: qty 2

## 2019-02-25 MED ORDER — CEFAZOLIN SODIUM-DEXTROSE 1-4 GM/50ML-% IV SOLN
INTRAVENOUS | Status: AC
  Filled 2019-02-25: qty 50

## 2019-02-27 NOTE — Discharge Summary (Signed)
Physician Discharge Summary  Patient ID: Andre Cervantes MRN: [id number] DOB/AGE: May 29, 1985 34 y.o.  Admit date: 02/24/2019 Discharge date: 02/27/2019  Admission Diagnoses: Left knee Osteoarthritis  Discharge Diagnoses:  Active Problems:   Osteoarthritis of left knee   Discharged Condition: stable  Hospital Course: patient was admitted to the hospital on January 29th for a right patellofemoral arthroplasty and MPFL reconstruction. Patient did well during surgery. Was sent to recovery in stable condition. Was sent to the recovery care center in stable condition with no concerns. He was seen by PT and was given exercises and observed. Patient did well overnight with no issues. He was sent home to next morning with no concerns. Patient received antibiotics: ancef, pain medications: oxycodone, tramadol, tylenol.   Consults: None  Significant Diagnostic Studies: none  Treatments: IV hydration, antibiotics: Ancef, analgesia: acetaminophen and Tramadol, oxycodone, anticoagulation: Eliquis, therapies: PT and surgery: right patellofemoral arthroplasty, MPFL reconstruction  Discharge Exam: Blood pressure 109/62, pulse 76, temperature (!) 97.5 F (36.4 C), resp. rate 16, height 5\' 9"  (1.753 m), weight 79.7 kg, SpO2 100 %. General appearance: alert, cooperative, appears stated age and no distress Extremities: extremities normal, atraumatic, no cyanosis or edema Pulses: 2+ and symmetric Skin: Skin color, texture, turgor normal. No rashes or lesions Neurologic: Alert and oriented X 3, normal strength and tone. Normal symmetric reflexes. Normal coordination and gait Incision/Wound: dressings clean dry and intact  Disposition: Discharge disposition: 01-Home or Self Care       Discharge Instructions    Call MD / Call 911   Complete by: As directed    If you experience chest pain or shortness of breath, CALL 911 and be transported to the hospital emergency room.  If you develope a fever  above 101 F, pus (white drainage) or increased drainage or redness at the wound, or calf pain, call your surgeon's office.   Constipation Prevention   Complete by: As directed    Drink plenty of fluids.  Prune juice may be helpful.  You may use a stool softener, such as Colace (over the counter) 100 mg twice a day.  Use MiraLax (over the counter) for constipation as needed.   Diet - low sodium heart healthy   Complete by: As directed    Discharge instructions   Complete by: As directed    Dr. Sydnee Cabal [facility] [address] [place], [place] [address] [phone]  PATELLOFEMORAL KNEE REPLACEMENT, MPFL RECONSTRUCTION  Knee Rehabilitation, Guidelines Following Surgery  Results after knee surgery are often greatly improved when you follow the exercise, range of motion and muscle strengthening exercises prescribed by your doctor. Safety measures are also important to protect the knee from further injury. Any time any of these exercises cause you to have increased pain or swelling in your knee joint, decrease the amount until you are comfortable again and slowly increase them. If you have problems or questions, call your caregiver or physical therapist for advice.   HOME CARE INSTRUCTIONS  Remove items at home which could result in a fall. This includes throw rugs or furniture in walking pathways.  ICE to the affected knee every three hours for 30 minutes at a time and then as needed for pain and swelling.  Continue to use ice on the knee for pain and swelling from surgery. You may notice swelling that will progress down to the foot and ankle.  This is normal after surgery.  Elevate the leg when you are not up walking on  it.   Continue to use the breathing machine which will help keep your temperature down.  It is common for your temperature to cycle up and down following surgery, especially at night when you are not up moving around and exerting yourself.  The breathing  machine keeps your lungs expanded and your temperature down. Do not place pillow under knee, focus on keeping the knee straight while resting   DIET You may resume your previous home diet once your are discharged from the hospital.  DRESSING / WOUND CARE / SHOWERING Keep the surgical dressing until follow up.  The dressing is water proof, but you need to put extra covering over it like plastic wrap.  IF THE DRESSING FALLS OFF or the wound gets wet inside, change the dressing with sterile gauze.  Please use good hand washing techniques before changing the dressing.  Do not use any lotions or creams on the incision until instructed by your surgeon.   You may start showering once you are discharged home but do not submerge the incision under water.  You are sent home with an ACE bandage on over the leg, this can be removed at 3 days from surgery. At this time you can start showering. Please place the white TED stocking on the surgical leg after. This needs to be worn on the surgical leg for 2 weeks after surgery.   ACTIVITY Walk with your walker as instructed. KEEP KNEE IMMOBILIZER ON WITH ANY AMBULATION DO NOT BEND YOUR KNEE Use walker as long as suggested by your caregivers. Avoid periods of inactivity such as sitting longer than an hour when not asleep. This helps prevent blood clots.  You may resume a sexual relationship in one month or when given the OK by your doctor.  You may return to work once you are cleared by your doctor.  Do not drive a car for 6 weeks or until released by you surgeon.  Do not drive while taking narcotics.  WEIGHT BEARING Weight bearing as tolerated with assist device (walker, cane, etc) as directed, use it as long as suggested by your surgeon or therapist, typically at least 4-6 weeks.  POSTOPERATIVE CONSTIPATION PROTOCOL Constipation - defined medically as fewer than three stools per week and severe constipation as less than one stool per week.  One of the  most common issues patients have following surgery is constipation.  Even if you have a regular bowel pattern at home, your normal regimen is likely to be disrupted due to multiple reasons following surgery.  Combination of anesthesia, postoperative narcotics, change in appetite and fluid intake all can affect your bowels.  In order to avoid complications following surgery, here are some recommendations in order to help you during your recovery period.  Colace (docusate) - Pick up an over-the-counter form of Colace or another stool softener and take twice a day as long as you are requiring postoperative pain medications.  Take with a full glass of water daily.  If you experience loose stools or diarrhea, hold the colace until you stool forms back up.  If your symptoms do not get better within 1 week or if they get worse, check with your doctor.  Dulcolax (bisacodyl) - Pick up over-the-counter and take as directed by the product packaging as needed to assist with the movement of your bowels.  Take with a full glass of water.  Use this product as needed if not relieved by Colace only.   MiraLax (polyethylene glycol) - Pick up  over-the-counter to have on hand.  MiraLax is a solution that will increase the amount of water in your bowels to assist with bowel movements.  Take as directed and can mix with a glass of water, juice, soda, coffee, or tea.  Take if you go more than two days without a movement. Do not use MiraLax more than once per day. Call your doctor if you are still constipated or irregular after using this medication for 7 days in a row.  If you continue to have problems with postoperative constipation, please contact the office for further assistance and recommendations.  If you experience "the worst abdominal pain ever" or develop nausea or vomiting, please contact the office immediatly for further recommendations for treatment.  ITCHING  If you experience itching with your medications, try  taking only a single pain pill, or even half a pain pill at a time.  You can also use Benadryl over the counter for itching or also to help with sleep.   TED HOSE STOCKINGS Wear the elastic stockings on both legs for two weeks following surgery during the day but you may remove then at night for sleeping.  Okay to remove ACE in 3 days, put TED on after  MEDICATIONS See your medication summary on the "After Visit Summary" that the nursing staff will review with you prior to discharge.  You may have some home medications which will be placed on hold until you complete the course of blood thinner medication.  It is important for you to complete the blood thinner medication as prescribed by your surgeon.  Continue your approved medications as instructed at time of discharge. If you were not previously taking any blood thinners prior to surgery please start taking Aspirin 325 mg tabs twice daily for 6 weeks. If you are unable to take Aspirin please let your doctor know.   PRECAUTIONS If you experience chest pain or shortness of breath - call 911 immediately for transfer to the hospital emergency department.  If you develop a fever greater that 101 F, purulent drainage from wound, increased redness or drainage from wound, foul odor from the wound/dressing, or calf pain - CONTACT YOUR SURGEON.                                                   FOLLOW-UP APPOINTMENTS Make sure you keep all of your appointments after your operation with your surgeon and caregivers. You should call the office at the above phone number and make an appointment for approximately two weeks after the date of your surgery or on the date instructed by your surgeon outlined in the "After Visit Summary".   RANGE OF MOTION AND STRENGTHENING EXERCISES  Rehabilitation of the knee is important following a knee injury or an operation. After just a few days of immobilization, the muscles of the thigh which control the knee become weakened  and shrink (atrophy). Knee exercises are designed to build up the tone and strength of the thigh muscles and to improve knee motion. Often times heat used for twenty to thirty minutes before working out will loosen up your tissues and help with improving the range of motion but do not use heat for the first two weeks following surgery. These exercises can be done on a training (exercise) mat, on the floor, on a table or on a  bed. Use what ever works the best and is most comfortable for you Knee exercises include:  Leg Lifts - While your knee is still immobilized in a splint or cast, you can do straight leg raises. Lift the leg to 60 degrees, hold for 3 sec, and slowly lower the leg. Repeat 10-20 times 2-3 times daily. Perform this exercise against resistance later as your knee gets better.  Quad and Hamstring Sets - Tighten up the muscle on the front of the thigh (Quad) and hold for 5-10 sec. Repeat this 10-20 times hourly. Hamstring sets are done by pushing the foot backward against an object and holding for 5-10 sec. Repeat as with quad sets.  Leg Slides: Lying on your back, slowly slide your foot toward your buttocks, bending your knee up off the floor (only go as far as is comfortable). Then slowly slide your foot back down until your leg is flat on the floor again. Angel Wings: Lying on your back spread your legs to the side as far apart as you can without causing discomfort.  A rehabilitation program following serious knee injuries can speed recovery and prevent re-injury in the future due to weakened muscles. Contact your doctor or a physical therapist for more information on knee rehabilitation.   IF YOU ARE TRANSFERRED TO A SKILLED REHAB FACILITY If the patient is transferred to a skilled rehab facility following release from the hospital, a list of the current medications will be sent to the facility for the patient to continue.  When discharged from the skilled rehab facility, please have the  facility set up the patient's [place] prior to being released. Also, the skilled facility will be responsible for providing the patient with their medications at time of release from the facility to include their pain medication, the muscle relaxants, and their blood thinner medication. If the patient is still at the rehab facility at time of the two week follow up appointment, the skilled rehab facility will also need to assist the patient in arranging follow up appointment in our office and any transportation needs.  MAKE SURE YOU:  Understand these instructions.  Get help right away if you are not doing well or get worse.    Pick up stool softner and laxative for home use following surgery while on pain medications. May shower starting three days after surgery. Please use a clean towel to pat the leg dry following showers. Continue to use ice for pain and swelling after surgery. Do not use any lotions or creams on the incision until instructed by you Start Aspirin immediately following surgery.   Do not put a pillow under the knee. Place it under the heel.   Complete by: As directed    Driving restrictions   Complete by: As directed    No driving for two weeks   TED hose   Complete by: As directed    Use stockings (TED hose) for three weeks on both leg(s).  You may remove them at night for sleeping.   Weight bearing as tolerated   Complete by: As directed      Allergies as of 02/25/2019      Reactions   Fentanyl Other (See Comments)   Patient developed severe chest wall rigidity following IV dose of Fentanyl for post op pain management. Reaction occurred on first and second administration of Fentanyl intraop producing significant drop in SaO2 and increased airway pressures.   Morphine Swelling      Medication  List    TAKE these medications   acetaminophen 500 MG tablet Commonly known as: TYLENOL Take 1,000 mg by mouth every 6 (six) hours as needed (for  pain.).   apixaban 5 MG Tabs tablet Commonly known as: ELIQUIS start with two-5mg  tablets twice daily for 7 days. On day 8, switch to one-5mg  tablet twice daily. What changed:   how much to take  how to take this  when to take this  additional instructions   atenolol 50 MG tablet Commonly known as: TENORMIN Take 50 mg by mouth daily.   atorvastatin 40 MG tablet Commonly known as: LIPITOR Take 40 mg by mouth every evening.   benztropine 0.5 MG tablet Commonly known as: COGENTIN Take 0.5 mg by mouth daily.   enoxaparin 120 MG/0.8ML injection Commonly known as: LOVENOX Inject 120 mg into the skin. Take day before surgery bid on 02-22-2019 and am on 02-23-2019 then done   fenofibrate micronized 134 MG capsule Commonly known as: LOFIBRA Take 134 mg by mouth at bedtime.   gabapentin 100 MG capsule Commonly known as: NEURONTIN Take 100 mg by mouth 4 (four) times daily.   loratadine 10 MG tablet Commonly known as: CLARITIN Take 10 mg by mouth daily.   methocarbamol 500 MG tablet Commonly known as: Robaxin Take 1 tablet (500 mg total) by mouth 4 (four) times daily.   nitroGLYCERIN 0.4 MG SL tablet Commonly known as: NITROSTAT Place 1 tablet (0.4 mg total) under the tongue every 5 (five) minutes as needed for chest pain.   omeprazole 20 MG capsule Commonly known as: PRILOSEC Take 20 mg by mouth daily.   ondansetron 4 MG tablet Commonly known as: Zofran Take 1 tablet (4 mg total) by mouth every 8 (eight) hours as needed for nausea or vomiting.   oxyCODONE 5 MG immediate release tablet Commonly known as: Roxicodone Take 1 tablet (5 mg total) by mouth every 4 (four) hours as needed for severe pain. Notes to patient: Took dose at 0600, can take next dose at 10am   risperiDONE 2 MG tablet Commonly known as: RISPERDAL Take 2 mg by mouth 2 (two) times daily.   tamsulosin 0.4 MG Caps capsule Commonly known as: FLOMAX Take 0.4 mg by mouth 2 (two) times daily.    Topamax 100 MG tablet Generic drug: topiramate Take 100 mg by mouth at bedtime.     ASK your doctor about these medications   cephALEXin 500 MG capsule Commonly known as: KEFLEX Take 1 capsule (500 mg total) by mouth 4 (four) times daily for 2 days. Ask about: Should I take this medication?            Discharge Care Instructions  (From admission, onward)         Start     Ordered   02/24/19 0000  Weight bearing as tolerated     02/24/19 1106           Signed: Drue Novel, PA-C Orthopedic Surgery 02/27/2019, 1:53 PM

## 2019-03-03 ENCOUNTER — Ambulatory Visit: Admit: 2019-03-03 | Payer: Medicare HMO | Admitting: Specialist

## 2019-03-03 SURGERY — ARTHROPLASTY, KNEE, TOTAL
Anesthesia: Spinal | Site: Knee | Laterality: Left

## 2019-05-22 ENCOUNTER — Other Ambulatory Visit: Payer: Self-pay

## 2019-05-23 ENCOUNTER — Other Ambulatory Visit: Payer: Self-pay

## 2019-05-23 ENCOUNTER — Encounter: Payer: Self-pay | Admitting: Cardiology

## 2019-05-23 ENCOUNTER — Ambulatory Visit (INDEPENDENT_AMBULATORY_CARE_PROVIDER_SITE_OTHER): Payer: Medicare HMO | Admitting: Cardiology

## 2019-05-23 VITALS — BP 110/70 | HR 61 | Temp 97.7°F | Ht 69.0 in | Wt 166.2 lb

## 2019-05-23 DIAGNOSIS — Z86711 Personal history of pulmonary embolism: Secondary | ICD-10-CM

## 2019-05-23 DIAGNOSIS — E782 Mixed hyperlipidemia: Secondary | ICD-10-CM

## 2019-05-23 HISTORY — DX: Personal history of pulmonary embolism: Z86.711

## 2019-05-23 MED ORDER — ATORVASTATIN CALCIUM 10 MG PO TABS: 10.0000 mg | ORAL_TABLET | Freq: Every day | ORAL | 3 refills | Status: DC

## 2019-05-23 NOTE — Patient Instructions (Signed)
Medication Instructions:  Your physician has recommended you make the following change in your medication:   Decrease your Atorvastin to 10 mg daily  *If you need a refill on your cardiac medications before your next appointment, please call your pharmacy*   Lab Work: None ordered If you have labs (blood work) drawn today and your tests are completely normal, you will receive your results only by: Marland Kitchen MyChart Message (if you have MyChart) OR . A paper copy in the mail If you have any lab test that is abnormal or we need to change your treatment, we will call you to review the results.   Testing/Procedures: None ordered   Follow-Up: At [facility] [place], you and your health needs are our priority.  As part of our continuing mission to provide you with exceptional heart care, we have created designated Provider Care Teams.  These Care Teams include your primary Cardiologist (physician) and Advanced Practice Providers (APPs -  Physician Assistants and Nurse Practitioners) who all work together to provide you with the care you need, when you need it.  We recommend signing up for the patient portal called "MyChart".  Sign up information is provided on this After Visit Summary.  MyChart is used to connect with patients for Virtual Visits (Telemedicine).  Patients are able to view lab/test results, encounter notes, upcoming appointments, etc.  Non-urgent messages can be sent to your provider as well.   To learn more about what you can do with MyChart, go to [email or web address].    Your next appointment:   12 month(s)  The format for your next appointment:   In Person  Provider:   Jyl Heinz, MD   Other Instructions NA

## 2019-05-23 NOTE — Progress Notes (Signed)
Cardiology Office Note:    Date:  05/23/2019   ID:  Andre Cervantes, DOB [birth year], MRN [id number]  PCP:  [facility]  Cardiologist:  Jenean Lindau, MD   Referring MD: [facility]    ASSESSMENT:    1. Mixed hyperlipidemia   2. History of pulmonary embolus (PE)    PLAN:    In order of problems listed above:  1. Primary prevention stressed with the patient.  Importance of compliance with diet and medication stressed and he vocalized understanding. 2. History of mixed hyperlipidemia: Patient on statin but in view of the CT scan report I have asked him to take 10 mg of atorvastatin daily and follow-up with primary care physician for blood test.  He agrees to do so. 3. History of pulmonary embolism on anticoagulation: This is managed by primary care physician.  Benefits and potential is explained and he vocalized understanding 4. Patient will be seen in follow-up appointment in 12 months or earlier if the patient has any concerns    Medication Adjustments/Labs and Tests Ordered: Current medicines are reviewed at length with the patient today.  Concerns regarding medicines are outlined above.  No orders of the defined types were placed in this encounter.  No orders of the defined types were placed in this encounter.    No chief complaint on file.    History of Present Illness:    Andre Cervantes is a 34 y.o. male.  Patient was evaluated by me for chest pain.  CT coronary angiography was within normal limits and calcium score was 0 subsequently the patient has done fine.  He has history of pulmonary embolism and is on anticoagulation.  He denies any problems at this time.  He history abuse tobacco with chewing and he has stopped it.  Past Medical History:  Diagnosis Date  . Acute back pain with sciatica 07/05/2017  . ADD (attention deficit disorder)   . Anxiety state 11/07/2009   Qualifier: Diagnosis of  By: Nils Pyle CMA (AAMA), Riki Rusk of this note might be different from the original. Overview:  Qualifier: Diagnosis of  By: Nils Pyle CMA ([place]), Mearl Latin  . Anxiety state 11/07/2009   Qualifier: Diagnosis of  By: Nils Pyle CMA (AAMA), Riki Rusk of this note might be different from the original. Overview:  Qualifier: Diagnosis of  By: Nils Pyle CMA ([place]), Mearl Latin  . Arthritis   . Attention deficit hyperactivity disorder (ADHD) 11/07/2009   Qualifier: Diagnosis of  By: Nils Pyle CMA (AAMA), Riki Rusk of this note might be different from the original. Overview:  Qualifier: Diagnosis of  By: Nils Pyle CMA ([place]), Mearl Latin  . Chest tightness 08/16/2017  . Dental infection 01/17/2017  . Depression 11/07/2009   Qualifier: Diagnosis of  By: Nils Pyle CMA (AAMA), Riki Rusk of this note might be different from the original. Overview:  Qualifier: Diagnosis of  By: Nils Pyle CMA ([place]), Mearl Latin  . DOE (dyspnea on exertion) 12/16/2017  . DVT (deep venous thrombosis) ([place]) 2013  . DVT (deep venous thrombosis) ([place]) 10/26/2011  . Fusion of lumbar spine   . GERD 11/07/2009   Qualifier: Diagnosis of  By: Nils Pyle CMA (AAMA), Riki Rusk of this note might be different from the original. Overview:  Qualifier: Diagnosis of  By: Nils Pyle CMA ([place]), Mearl Latin  . GERD (gastroesophageal reflux disease)   . HCAP (healthcare-associated pneumonia)   . High cholesterol   . History  of lumbar fusion 07/05/2017  . History of pulmonary embolism 08/16/2017  . Hyperlipidemia 11/07/2009   Qualifier: Diagnosis of  By: Nils Pyle CMA (AAMA), Riki Rusk of this note might be different from the original. Overview:  Qualifier: Diagnosis of  By: Nils Pyle CMA ([place]), Mearl Latin  . Hypertension   . Hyponatremia 03/14/2018  . Leukocytosis 01/17/2017  . Osteoarthritis of left knee 02/24/2019  . Other dysphagia 11/07/2009   Qualifier: Diagnosis of  By: Nils Pyle CMA (AAMA), Mearl Latin    . Pain in left knee 05/19/2017  . PE (pulmonary embolism) 2013, 2016  or 2018   hx  . Pneumonia yrs ago  . S/P knee surgery 01/06/2013  . 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 Status post knee surgery 01/05/2013  . Thrombocytosis ([place]) 06/29/2017  . Tobacco abuse 11/07/2009   Qualifier: Diagnosis of  By: Nils Pyle CMA (AAMA), Riki Rusk of this note might be different from the original. Overview:  Qualifier: Diagnosis of  By: Nils Pyle CMA Deborra Medina), Mearl Latin    Past Surgical History:  Procedure Laterality Date  . BACK SURGERY    . cyst removed      Head dermatologist removed in office  . FRACTURE SURGERY     rt hand  . KNEE ARTHROSCOPY WITH LATERAL RELEASE Right 01/05/2013   Procedure: KNEE ARTHROSCOPY WITH DEBRIDEMENT, LATERAL RELEASE, CARTICEL PROCEDURE, OPEN ALLOGRAFT;  Surgeon: Sydnee Cabal, MD;  Location: [place];  Service: Orthopedics;  Laterality: Right;  . KNEE SURGERY Bilateral 2013    knee  . MEDIAL PATELLOFEMORAL LIGAMENT REPAIR Right 01/05/2013   Procedure: MEDIAL PATELLA FEMORAL LIGAMENT RECONSTRUCTION;  Surgeon: Sydnee Cabal, MD;  Location: [place];  Service: Orthopedics;  Laterality: Right;  . rt hand little finger pinning  childhood  . TOTAL KNEE ARTHROPLASTY Left 02/24/2019   Procedure: Knee allograft MPFL reconstruction patellofemoral arthroplasty;  Surgeon: Sydnee Cabal, MD;  Location: [facility];  Service: Orthopedics;  Laterality: Left;  adductor canal    Current Medications: Current Meds  Medication Sig  . acetaminophen (TYLENOL) 500 MG tablet Take 1,000 mg by mouth every 6 (six) hours as needed (for pain.).  Marland Kitchen apixaban (ELIQUIS) 5 MG TABS tablet start with two-5mg  tablets twice daily for 7 days. On day 8, switch to one-5mg  tablet twice daily.  Marland Kitchen ascorbic acid (VITAMIN C) 500 MG tablet Take 500 mg by mouth 2 (two) times daily. For 10 days  . atenolol (TENORMIN) 50 MG tablet Take 50 mg by mouth daily.  Marland Kitchen atorvastatin  (LIPITOR) 40 MG tablet Take 10 mg by mouth every evening.  . benztropine (COGENTIN) 0.5 MG tablet Take 0.5 mg by mouth daily.  . fenofibrate micronized (LOFIBRA) 134 MG capsule Take 134 mg by mouth at bedtime.   . fluticasone (FLONASE) 50 MCG/ACT nasal spray   . gabapentin (NEURONTIN) 100 MG capsule Take 100 mg by mouth 4 (four) times daily.   Marland Kitchen loratadine (CLARITIN) 10 MG tablet Take 10 mg by mouth daily.  . methocarbamol (ROBAXIN) 500 MG tablet Take 1 tablet (500 mg total) by mouth 4 (four) times daily.  . nitroGLYCERIN (NITROSTAT) 0.4 MG SL tablet Place 1 tablet (0.4 mg total) under the tongue every 5 (five) minutes as needed for chest pain.  Marland Kitchen omeprazole (PRILOSEC) 20 MG capsule Take 20 mg by mouth daily.  . ondansetron (ZOFRAN) 4 MG tablet Take 1 tablet (4 mg total) by mouth every 8 (eight)  hours as needed for nausea or vomiting.  . risperiDONE (RISPERDAL) 2 MG tablet Take 2 mg by mouth 2 (two) times daily.   . tamsulosin (FLOMAX) 0.4 MG CAPS capsule Take 0.4 mg by mouth 2 (two) times daily.   Marland Kitchen topiramate (TOPAMAX) 100 MG tablet Take 100 mg by mouth at bedtime.      Allergies:   Fentanyl and Morphine   Social History   Socioeconomic History  . Marital status: Single    Spouse name: Not on file  . Number of children: Not on file  . Years of education: Not on file  . Highest education level: Not on file  Occupational History  . Not on file  Tobacco Use  . Smoking status: Never Smoker  . Smokeless tobacco: Former Systems developer    Types: Chew  . Tobacco comment: occ chew  Substance and Sexual Activity  . Alcohol use: Not Currently    Comment: occ beer  . 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 Sexual activity: Not Currently  Other Topics Concern  . Not on file  Social History Narrative  . Not on file   Social Determinants of Health   Financial Resource Strain:   . Difficulty of Paying Living Expenses:   Food  Insecurity:   . Worried About Charity fundraiser in the Last Year:   . Arboriculturist in the Last Year:   Transportation Needs:   . Film/video editor (Medical):   Marland Kitchen Lack of Transportation (Non-Medical):   Physical Activity:   . Days of Exercise per Week:   . Minutes of Exercise per Session:   Stress:   . Feeling of Stress :   Social Connections:   . Frequency of Communication with Friends and Family:   . Frequency of Social Gatherings with Friends and Family:   . Attends Religious Services:   . Active Member of Clubs or Organizations:   . Attends Archivist Meetings:   Marland Kitchen Marital Status:      Family History: The patient's family history includes Heart disease in his father.  ROS:   Please see the history of present illness.    All other systems reviewed and are negative.  EKGs/Labs/Other Studies Reviewed:    The following studies were reviewed today: Calcium CT scoring was 0.  Coronary angiography at that point was unremarkable.  EKG done today reveals sinus rhythm and nonspecific ST-T changes.   Recent Labs: 02/22/2019: ALT 57; Platelets 393 02/24/2019: BUN 16; Creatinine, Ser 1.00; Hemoglobin 12.9; Potassium 4.1; Sodium 143  Recent Lipid Panel    Component Value Date/Time   CHOL 117 03/16/2018 0817   TRIG 90 03/16/2018 0817   HDL 33 (L) 03/16/2018 0817   CHOLHDL 3.5 03/16/2018 0817   LDLCALC 66 03/16/2018 0817    Physical Exam:    VS:  BP 110/70   Pulse 61   Temp 97.7 F (36.5 C)   Ht 5\' 9"  (1.753 m)   Wt 166 lb 3.2 oz (75.4 kg)   SpO2 100%   BMI 24.54 kg/m     Wt Readings from Last 3 Encounters:  05/23/19 166 lb 3.2 oz (75.4 kg)  02/24/19 175 lb 9.6 oz (79.7 kg)  02/22/19 179 lb (81.2 kg)     GEN: Patient is in no acute distress HEENT: Normal NECK: No JVD; No carotid bruits LYMPHATICS: No  lymphadenopathy CARDIAC: Hear sounds regular, 2/6 systolic murmur at the apex. RESPIRATORY:  Clear to auscultation without rales, wheezing or  rhonchi  ABDOMEN: Soft, non-tender, non-distended MUSCULOSKELETAL:  No edema; No deformity  SKIN: Warm and dry NEUROLOGIC:  Alert and oriented x 3 PSYCHIATRIC:  Normal affect   Signed, Jenean Lindau, MD  05/23/2019 11:45 AM    [place]

## 2020-03-18 ENCOUNTER — Encounter: Payer: Self-pay | Admitting: General Practice

## 2020-05-22 ENCOUNTER — Ambulatory Visit: Payer: Medicare HMO | Admitting: Cardiology

## 2020-06-13 ENCOUNTER — Other Ambulatory Visit: Payer: Self-pay

## 2020-06-13 DIAGNOSIS — I2699 Other pulmonary embolism without acute cor pulmonale: Secondary | ICD-10-CM | POA: Insufficient documentation

## 2020-06-13 DIAGNOSIS — E7800 Pure hypercholesterolemia, unspecified: Secondary | ICD-10-CM | POA: Insufficient documentation

## 2020-06-13 DIAGNOSIS — F988 Other specified behavioral and emotional disorders with onset usually occurring in childhood and adolescence: Secondary | ICD-10-CM | POA: Insufficient documentation

## 2020-06-13 DIAGNOSIS — K219 Gastro-esophageal reflux disease without esophagitis: Secondary | ICD-10-CM | POA: Insufficient documentation

## 2020-06-13 DIAGNOSIS — M1990 Unspecified osteoarthritis, unspecified site: Secondary | ICD-10-CM | POA: Insufficient documentation

## 2020-06-13 DIAGNOSIS — I10 Essential (primary) hypertension: Secondary | ICD-10-CM | POA: Insufficient documentation

## 2020-06-14 ENCOUNTER — Ambulatory Visit (INDEPENDENT_AMBULATORY_CARE_PROVIDER_SITE_OTHER): Payer: Medicare Other | Admitting: Cardiology

## 2020-06-14 ENCOUNTER — Other Ambulatory Visit: Payer: Self-pay

## 2020-06-14 ENCOUNTER — Encounter: Payer: Self-pay | Admitting: Cardiology

## 2020-06-14 VITALS — BP 120/72 | HR 73 | Ht 69.6 in | Wt 172.6 lb

## 2020-06-14 DIAGNOSIS — E782 Mixed hyperlipidemia: Secondary | ICD-10-CM | POA: Diagnosis not present

## 2020-06-14 DIAGNOSIS — R0789 Other chest pain: Secondary | ICD-10-CM

## 2020-06-14 DIAGNOSIS — R002 Palpitations: Secondary | ICD-10-CM

## 2020-06-14 DIAGNOSIS — Z86711 Personal history of pulmonary embolism: Secondary | ICD-10-CM | POA: Diagnosis not present

## 2020-06-14 DIAGNOSIS — R079 Chest pain, unspecified: Secondary | ICD-10-CM | POA: Diagnosis not present

## 2020-06-14 HISTORY — DX: Palpitations: R00.2

## 2020-06-14 MED ORDER — METOPROLOL TARTRATE 25 MG PO TABS
25.0000 mg | ORAL_TABLET | Freq: Every day | ORAL | 0 refills | Status: DC | PRN
Start: 2020-06-14 — End: 2020-07-08

## 2020-06-14 NOTE — Patient Instructions (Addendum)
Medication Instructions:  Your physician has recommended you make the following change in your medication:   Take Metoprolol tartrate 25 mg as needed.  *If you need a refill on your cardiac medications before your next appointment, please call your pharmacy*   Lab Work: None ordered If you have labs (blood work) drawn today and your tests are completely normal, you will receive your results only by: Marland Kitchen MyChart Message (if you have MyChart) OR . A paper copy in the mail If you have any lab test that is abnormal or we need to change your treatment, we will call you to review the results.   Testing/Procedures: None ordered   Follow-Up: At [facility], you and your health needs are our priority.  As part of our continuing mission to provide you with exceptional heart care, we have created designated Provider Care Teams.  These Care Teams include your primary Cardiologist (physician) and Advanced Practice Providers (APPs -  Physician Assistants and Nurse Practitioners) who all work together to provide you with the care you need, when you need it.  We recommend signing up for the patient portal called "MyChart".  Sign up information is provided on this After Visit Summary.  MyChart is used to connect with patients for Virtual Visits (Telemedicine).  Patients are able to view lab/test results, encounter notes, upcoming appointments, etc.  Non-urgent messages can be sent to your provider as well.   To learn more about what you can do with MyChart, go to [email or web address].    Your next appointment:   6 month(s)  The format for your next appointment:   In Person  Provider:   Jyl Heinz, MD   Other Instructions Metoprolol Tablets What is this medicine? METOPROLOL (me TOE proe lole) is a beta blocker. It decreases the amount of work your heart has to do and helps your heart beat regularly. It is used to treat high blood pressure and/or prevent chest pain (also called angina).  It is also used after a heart attack to prevent a second one. This medicine may be used for other purposes; ask your health care provider or pharmacist if you have questions. COMMON BRAND NAME(S): Lopressor What should I tell my health care provider before I take this medicine? They need to know if you have any of these conditions:  diabetes  heart or vessel disease like slow heart rate, worsening heart failure, heart block, sick sinus syndrome or Raynaud's disease  kidney disease  liver disease  lung or breathing disease, like asthma or emphysema  pheochromocytoma  thyroid disease  an unusual or allergic reaction to metoprolol, other beta-blockers, medicines, foods, dyes, or preservatives  pregnant or trying to get pregnant  breast-feeding How should I use this medicine? Take this drug by mouth with water. Take it as directed on the prescription label at the same time every day. You can take it with or without food. You should always take it the same way. Keep taking it unless your health care provider tells you to stop. Talk to your health care provider about the use of this drug in children. Special care may be needed. Overdosage: If you think you have taken too much of this medicine contact a poison control center or emergency room at once. NOTE: This medicine is only for you. Do not share this medicine with others. What if I miss a dose? If you miss a dose, take it as soon as you can. If it is almost time for your next  dose, take only that dose. Do not take double or extra doses. What may interact with this medicine? This medicine may interact with the following medications:  certain medicines for blood pressure, heart disease, irregular heart beat  certain medicines for depression like monoamine oxidase (MAO) inhibitors, fluoxetine, or paroxetine  clonidine  dobutamine  epinephrine  isoproterenol  reserpine This list may not describe all possible interactions.  Give your health care provider a list of all the medicines, herbs, non-prescription drugs, or dietary supplements you use. Also tell them if you smoke, drink alcohol, or use illegal drugs. Some items may interact with your medicine. What should I watch for while using this medicine? Visit your doctor or health care professional for regular check ups. Contact your doctor right away if your symptoms worsen. Check your blood pressure and pulse rate regularly. Ask your health care professional what your blood pressure and pulse rate should be, and when you should contact them. You may get drowsy or dizzy. Do not drive, use machinery, or do anything that needs mental alertness until you know how this medicine affects you. Do not sit or stand up quickly, especially if you are an older patient. This reduces the risk of dizzy or fainting spells. Contact your doctor if these symptoms continue. Alcohol may interfere with the effect of this medicine. Avoid alcoholic drinks. This medicine may increase blood sugar. Ask your healthcare provider if changes in diet or medicines are needed if you have diabetes. What side effects may I notice from receiving this medicine? Side effects that you should report to your doctor or health care professional as soon as possible:  allergic reactions like skin rash, itching or hives  cold or numb hands or feet  depression  difficulty breathing  faint  fever with sore throat  irregular heartbeat, chest pain  rapid weight gain  signs and symptoms of high blood sugar such as being more thirsty or hungry or having to urinate more than normal. You may also feel very tired or have blurry vision.  swollen legs or ankles Side effects that usually do not require medical attention (report to your doctor or health care professional if they continue or are bothersome):  anxiety or nervousness  change in sex drive or performance  dry skin  headache  nightmares or trouble  sleeping  short term memory loss  stomach upset or diarrhea This list may not describe all possible side effects. Call your doctor for medical advice about side effects. You may report side effects to FDA at 1-800-FDA-1088. Where should I keep my medicine? Keep out of the reach of children and pets. Store at room temperature between 15 and 30 degrees C (59 and 86 degrees F). Protect from moisture. Keep the container tightly closed. Throw away any unused drug after the expiration date. NOTE: This sheet is a summary. It may not cover all possible information. If you have questions about this medicine, talk to your doctor, pharmacist, or health care provider.  2021 Elsevier/Gold Standard (2018-08-25 17:21:17)

## 2020-06-14 NOTE — Progress Notes (Signed)
Cardiology Office Note:    Date:  06/14/2020   ID:  Andre Cervantes, DOB 01-01-1986, MRN [id number]  PCP:  [place], Sarah T, MD  Cardiologist:  Jenean Lindau, MD   Referring MD: [facility]    ASSESSMENT:    1. Mixed hyperlipidemia   2. Chest tightness   3. Chest pain, unspecified type   4. History of pulmonary embolus (PE)   5. Palpitations    PLAN:    In order of problems listed above:  1. Primary prevention stressed with the patient.  Importance of compliance with diet medication stressed and she vocalized understanding. 2. He was advised to walk at least half an hour a day 5 days a week and he promises to do so. 3. History of pulmonary embolism on anticoagulation: Managed by primary care 4. History of dyslipidemia: On statin therapy also managed by primary care.  Diet emphasized. 5. History of palpitations: I discussed this with him at length.  I told him to take metoprolol tartrate 25 mg tablet on a as needed basis as needed and give him a prescription for it. 6. Patient will be seen in follow-up appointment in 6 months or earlier if the patient has any concerns    Medication Adjustments/Labs and Tests Ordered: Current medicines are reviewed at length with the patient today.  Concerns regarding medicines are outlined above.  Orders Placed This Encounter  Procedures  . EKG 12-Lead   Meds ordered this encounter  Medications  . metoprolol tartrate (LOPRESSOR) 25 MG tablet    Sig: Take 1 tablet (25 mg total) by mouth daily as needed (palpitations).    Dispense:  30 tablet    Refill:  0     No chief complaint on file.    History of Present Illness:    Andre Cervantes is a 35 y.o. male.  Patient has past medical history of pulmonary embolism, mixed dyslipidemia and chest pain.  He was evaluated for the same at his coronary CT angiography was unremarkable with calcium score of 0.  Subsequently standpoint.  No chest pain orthopnea or PND.  At the  time of my evaluation, the patient is alert awake oriented and in no distress.  Past Medical History:  Diagnosis Date  . Acute back pain with sciatica 07/05/2017  . ADD (attention deficit disorder)   . Anxiety state 11/07/2009   Qualifier: Diagnosis of  By: Nils Pyle CMA (AAMA), Riki Rusk of this note might be different from the original. Overview:  Qualifier: Diagnosis of  By: Nils Pyle CMA ([place]), Mearl Latin  . Arthritis   . Attention deficit hyperactivity disorder (ADHD) 11/07/2009   Qualifier: Diagnosis of  By: Nils Pyle CMA (AAMA), Riki Rusk of this note might be different from the original. Overview:  Qualifier: Diagnosis of  By: Nils Pyle CMA ([place]), Mearl Latin  . Chest tightness 08/16/2017  . Dental infection 01/17/2017  . Depression 11/07/2009   Qualifier: Diagnosis of  By: Nils Pyle CMA (AAMA), Riki Rusk of this note might be different from the original. Overview:  Qualifier: Diagnosis of  By: Nils Pyle CMA ([place]), Mearl Latin  . DOE (dyspnea on exertion) 12/16/2017  . DVT (deep venous thrombosis) ([place]) 2013  . Fusion of lumbar spine   . GERD 11/07/2009   Qualifier: Diagnosis of  By: Nils Pyle CMA (AAMA), Riki Rusk of this note might be different from the original. Overview:  Qualifier: Diagnosis of  By: Nils Pyle CMA ([place]), Mearl Latin  .  GERD (gastroesophageal reflux disease)   . HCAP (healthcare-associated pneumonia)   . High cholesterol   . History of lumbar fusion 07/05/2017  . History of pulmonary embolism 08/16/2017  . History of pulmonary embolus (PE) 05/23/2019  . Hyperlipidemia 11/07/2009   Qualifier: Diagnosis of  By: Nils Pyle CMA (AAMA), Riki Rusk of this note might be different from the original. Overview:  Qualifier: Diagnosis of  By: Nils Pyle CMA ([place]), Mearl Latin  . Hypertension   . Hyponatremia 03/14/2018  . Leukocytosis 01/17/2017  . Osteoarthritis of left knee 02/24/2019  . Other dysphagia 11/07/2009   Qualifier: Diagnosis of  By: Nils Pyle CMA (AAMA),  Mearl Latin    . Pain in left knee 05/19/2017  . PE (pulmonary embolism) 2013, 2016 or 2018   hx  . Pneumonia yrs ago  . Pulmonary embolism (HCC)    hx  . S/P knee surgery 01/06/2013  . 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 Status post knee surgery 01/05/2013  . Thrombocytosis 06/29/2017  . Tobacco abuse 11/07/2009   Qualifier: Diagnosis of  By: Nils Pyle CMA (AAMA), Riki Rusk of this note might be different from the original. Overview:  Qualifier: Diagnosis of  By: Nils Pyle CMA Deborra Medina), Mearl Latin    Past Surgical History:  Procedure Laterality Date  . BACK SURGERY    . cyst removed      Head dermatologist removed in office  . FRACTURE SURGERY     rt hand  . KNEE ARTHROSCOPY WITH LATERAL RELEASE Right 01/05/2013   Procedure: KNEE ARTHROSCOPY WITH DEBRIDEMENT, LATERAL RELEASE, CARTICEL PROCEDURE, OPEN ALLOGRAFT;  Surgeon: Sydnee Cabal, MD;  Location: [facility];  Service: Orthopedics;  Laterality: Right;  . KNEE SURGERY Bilateral 2013    knee  . MEDIAL PATELLOFEMORAL LIGAMENT REPAIR Right 01/05/2013   Procedure: MEDIAL PATELLA FEMORAL LIGAMENT RECONSTRUCTION;  Surgeon: Sydnee Cabal, MD;  Location: [facility];  Service: Orthopedics;  Laterality: Right;  . rt hand little finger pinning  childhood  . TOTAL KNEE ARTHROPLASTY Left 02/24/2019   Procedure: Knee allograft MPFL reconstruction patellofemoral arthroplasty;  Surgeon: Sydnee Cabal, MD;  Location[facility];  Service: Orthopedics;  Laterality: Left;  adductor canal    Current Medications: Current Meds  Medication Sig  . apixaban (ELIQUIS) 5 MG TABS tablet Take 5 mg by mouth 2 (two) times daily.  Marland Kitchen atenolol (TENORMIN) 50 MG tablet Take 50 mg by mouth daily.  Marland Kitchen atorvastatin (LIPITOR) 40 MG tablet Take 1 tablet by mouth daily.  . benztropine (COGENTIN) 1 MG tablet Take 1 mg by mouth daily.  . fenofibrate micronized (LOFIBRA) 134  MG capsule Take 134 mg by mouth at bedtime.   . fluticasone (FLONASE) 50 MCG/ACT nasal spray Place 1 spray into both nostrils as needed for rhinitis or allergies.  Marland Kitchen gabapentin (NEURONTIN) 100 MG capsule Take 100 mg by mouth 4 (four) times daily.  Marland Kitchen loratadine (CLARITIN) 10 MG tablet Take 10 mg by mouth daily.  . methocarbamol (ROBAXIN) 500 MG tablet Take 1 tablet (500 mg total) by mouth 4 (four) times daily.  . metoprolol tartrate (LOPRESSOR) 25 MG tablet Take 1 tablet (25 mg total) by mouth daily as needed (palpitations).  . nitroGLYCERIN (NITROSTAT) 0.4 MG SL tablet Place 1 tablet (0.4 mg total) under the tongue every 5 (five) minutes as needed for chest pain.  Marland Kitchen omeprazole (PRILOSEC) 20 MG capsule Take 20 mg by mouth daily.  . risperiDONE (RISPERDAL)  2 MG tablet Take 6 mg by mouth daily.  . tamsulosin (FLOMAX) 0.4 MG CAPS capsule Take 0.4 mg by mouth 2 (two) times daily.   Marland Kitchen topiramate (TOPAMAX) 100 MG tablet Take 100 mg by mouth at bedtime.      Allergies:   Fentanyl and Morphine   Social History   Socioeconomic History  . Marital status: Single    Spouse name: Not on file  . Number of children: Not on file  . Years of education: Not on file  . Highest education level: Not on file  Occupational History  . Not on file  Tobacco Use  . Smoking status: Never Smoker  . Smokeless tobacco: Former Systems developer    Types: Chew  . Tobacco comment: occ chew  Vaping Use  . Vaping Use: Never used  Substance and Sexual Activity  . Alcohol use: Not Currently    Comment: occ beer  . 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 Sexual activity: Not Currently  Other Topics Concern  . Not on file  Social History Narrative  . Not on file   Social Determinants of Health   Financial Resource Strain: Not on file  Food Insecurity: Not on file  Transportation Needs: Not on file  Physical Activity: Not on file  Stress: Not on file   Social Connections: Not on file     Family History: The patient's family history includes Heart disease in his father.  ROS:   Please see the history of present illness.    All other systems reviewed and are negative.  EKGs/Labs/Other Studies Reviewed:    The following studies were reviewed today: I discussed my findings with the patient at length.  CT of the coronary arteries was unremarkable and normal.   Recent Labs: No results found for requested labs within last 8760 hours.  Recent Lipid Panel    Component Value Date/Time   CHOL 117 03/16/2018 0817   TRIG 90 03/16/2018 0817   HDL 33 (L) 03/16/2018 0817   CHOLHDL 3.5 03/16/2018 0817   LDLCALC 66 03/16/2018 0817    Physical Exam:    VS:  BP 120/72   Pulse 73   Ht 5' 9.6" (1.768 m)   Wt 172 lb 9.6 oz (78.3 kg)   SpO2 97%   BMI 25.05 kg/m     Wt Readings from Last 3 Encounters:  06/14/20 172 lb 9.6 oz (78.3 kg)  05/23/19 166 lb 3.2 oz (75.4 kg)  02/24/19 175 lb 9.6 oz (79.7 kg)     GEN: Patient is in no acute distress HEENT: Normal NECK: No JVD; No carotid bruits LYMPHATICS: No lymphadenopathy CARDIAC: Hear sounds regular, 2/6 systolic murmur at the apex. RESPIRATORY:  Clear to auscultation without rales, wheezing or rhonchi  ABDOMEN: Soft, non-tender, non-distended MUSCULOSKELETAL:  No edema; No deformity  SKIN: Warm and dry NEUROLOGIC:  Alert and oriented x 3 PSYCHIATRIC:  Normal affect   Signed, Jenean Lindau, MD  06/14/2020 4:35 PM     [facility]

## 2020-07-08 ENCOUNTER — Other Ambulatory Visit: Payer: Self-pay | Admitting: Cardiology

## 2020-07-08 NOTE — Telephone Encounter (Signed)
Metoprolol tartrate 25 mg tablet # 30 x 3 refills sent to pharmacy

## 2020-08-27 ENCOUNTER — Telehealth: Payer: Self-pay | Admitting: Cardiology

## 2020-08-27 NOTE — Telephone Encounter (Signed)
Patient wants to switch from Dr. Geraldo Pitter to Dr. Bettina Gavia. The patient's father is going to start seeing Dr. Bettina Gavia and the patient wants to see the same provider as his Dad. Please let the patient know what the office decides

## 2020-10-05 ENCOUNTER — Other Ambulatory Visit: Payer: Self-pay | Admitting: Cardiology

## 2020-10-08 NOTE — Telephone Encounter (Signed)
Metoprolol tartrate 25 mg # 90 x 1 refill sent to   [id number]- [place], [place] - [address]

## 2020-10-25 ENCOUNTER — Telehealth: Payer: Self-pay | Admitting: Cardiology

## 2020-10-25 NOTE — Telephone Encounter (Signed)
Pt c/o of Chest Pain: STAT if CP now or developed within 24 hours  1. Are you having CP right now? No   2. Are you experiencing any other symptoms (ex. SOB, nausea, vomiting, sweating)? no  3. How long have you been experiencing CP? Since yesterday  4. Is your CP continuous or coming and going? Comes and goes   5. Have you taken Nitroglycerin? yes ?

## 2020-10-25 NOTE — Telephone Encounter (Signed)
Spoke to the patients father just now and he let me know that the patient has been having chest pain since yesterday. He tells me that he has also been having dizzy spells to where he tells his father that he can not see him at all. He has had chest pain for several days now coming and going. He took a Nitro tablet yesterday and it did not help at all. His blood pressure was 110/66 heart rate 66.  The patient is not having chest pain at this current time.   I advised that if he had chest pain, or these dizzy spells again that he needed to proceed to the ED. He states that he will do so.   I will route to Dr. Bettina Gavia for recommendations.

## 2020-10-25 NOTE — Telephone Encounter (Signed)
Spoke to the patient just now and let him know that Dr. Joya Gaskins recommends that he go to the ED to be evaluated. He states that he will.    Encouraged patient to call back with any questions or concerns.

## 2020-12-03 ENCOUNTER — Telehealth: Payer: Self-pay

## 2020-12-03 ENCOUNTER — Ambulatory Visit: Payer: Medicare Other | Attending: Internal Medicine

## 2020-12-03 DIAGNOSIS — Z23 Encounter for immunization: Secondary | ICD-10-CM

## 2020-12-03 NOTE — Telephone Encounter (Signed)
   [facility] Pre-operative Risk Assessment    Patient Name: Andre Cervantes  DOB: 1985-04-10 MRN: [id number]  HEARTCARE STAFF:  - IMPORTANT!!!!!! Under Visit Info/Reason for Call, type in Other and utilize the format Clearance MM/DD/YY or Clearance TBD. Do not use dashes or single digits. - Please review there is not already an duplicate clearance open for this procedure. - If request is for dental extraction, please clarify the # of teeth to be extracted. - If the patient is currently at the dentist's office, call Pre-Op Callback Staff (MA/nurse) to input urgent request.  - If the patient is not currently in the dentist office, please route to the Pre-Op pool.  Request for surgical clearance:  What type of surgery is being performed? Right knee patellofemoral arthroplasty  When is this surgery scheduled? TBD  What type of clearance is required (medical clearance vs. Pharmacy clearance to hold med vs. Both)? Both  Are there any medications that need to be held prior to surgery and how long? Eliquis  Practice name and name of physician performing surgery? [facility] Dr. Hart Robinsons  What is the office phone number?   7.   What is the office fax number? [phone] attention Kerri Maze  8.   Anesthesia type (None, local, MAC, general) ? Spinal   Truddie Hidden 12/03/2020, 1:46 PM  _________________________________________________________________   (provider comments below)

## 2020-12-03 NOTE — Telephone Encounter (Signed)
Eliquis managed by PCP

## 2020-12-03 NOTE — Progress Notes (Signed)
   Covid-19 Vaccination Clinic  Name:  Andre Cervantes    MRN: [id number] DOB: [birth year]  12/03/2020  Mr. Andre Cervantes was observed post Covid-19 immunization for 15 minutes without incident. He was provided with Vaccine Information Sheet and instruction to access the V-Safe system.   Mr. Andre Cervantes was instructed to call 911 with any severe reactions post vaccine: Difficulty breathing  Swelling of face and throat  A fast heartbeat  A bad rash all over body  Dizziness and weakness   Immunizations Administered     Name Date Dose VIS Date Route   Pfizer Covid-19 Vaccine Bivalent Booster 12/03/2020  3:20 PM 0.3 mL 09/25/2020 Intramuscular   Manufacturer: [employer]   Lot: [id number]   [place]: [phone]

## 2020-12-04 NOTE — Telephone Encounter (Signed)
Pt has appt with Dr. Bettina Gavia 12/11/20. Will send notes to MD for appt. Will send FYI to requesting office pt has appt.

## 2020-12-04 NOTE — Telephone Encounter (Signed)
Pre-op covering staff, patient has an appointment scheduled with Dr. Bettina Gavia next week on 12/11/2020. Can we check with the surgeon's office when they were hoping to do surgery? Can it wait until patient has been seen by Dr. Bettina Gavia?  Thank you!

## 2020-12-11 ENCOUNTER — Other Ambulatory Visit: Payer: Self-pay

## 2020-12-11 ENCOUNTER — Encounter: Payer: Self-pay | Admitting: Cardiology

## 2020-12-11 ENCOUNTER — Ambulatory Visit (INDEPENDENT_AMBULATORY_CARE_PROVIDER_SITE_OTHER): Payer: Medicare Other | Admitting: Cardiology

## 2020-12-11 ENCOUNTER — Ambulatory Visit (INDEPENDENT_AMBULATORY_CARE_PROVIDER_SITE_OTHER): Payer: Medicare Other

## 2020-12-11 VITALS — BP 90/72 | HR 74 | Ht 69.6 in | Wt 177.0 lb

## 2020-12-11 DIAGNOSIS — Z01810 Encounter for preprocedural cardiovascular examination: Secondary | ICD-10-CM

## 2020-12-11 DIAGNOSIS — R42 Dizziness and giddiness: Secondary | ICD-10-CM | POA: Diagnosis not present

## 2020-12-11 DIAGNOSIS — R760 Raised antibody titer: Secondary | ICD-10-CM

## 2020-12-11 DIAGNOSIS — E782 Mixed hyperlipidemia: Secondary | ICD-10-CM | POA: Diagnosis not present

## 2020-12-11 DIAGNOSIS — Z86711 Personal history of pulmonary embolism: Secondary | ICD-10-CM

## 2020-12-11 MED ORDER — TAMSULOSIN HCL 0.4 MG PO CAPS: 0.4000 mg | ORAL_CAPSULE | Freq: Every day | ORAL | 0 refills | Status: AC

## 2020-12-11 NOTE — Progress Notes (Signed)
Cardiology Office Note:    Date:  12/11/2020   ID:  Andre Cervantes, DOB 1985-12-16, MRN [id number]  PCP:  Martinique, Sarah T, MD  Cardiologist:  Shirlee More, MD    Referring MD: Martinique, Sarah T, MD    ASSESSMENT:    1. Dizzy spells   2. Mixed hyperlipidemia   3. History of pulmonary embolus (PE)   4. Lupus anticoagulant positive   5. Preoperative cardiovascular examination    PLAN:    In order of problems listed above:  I suspect as a consequence of polypharmacy discontinue a beta-blocker reduce his alpha-blocker 50% trend blood pressure sitting and standing we gave the father a blood pressure cuff we had in the office from our social work department apply a 7-day ZIO monitor to assess for arrhythmia.  Following drop off a list of blood pressures in 3 weeks Discontinue fenofibrate continue statin High risk hypercoagulable see instructions below and remain on lifelong anticoagulation After review the results of his monitor and blood pressures are make a decision about advisability of elective surgery.  Recommendations for anticoagulant management"   Next appointment: 6 weeks   Medication Adjustments/Labs and Tests Ordered: Current medicines are reviewed at length with the patient today.  Concerns regarding medicines are outlined above.  No orders of the defined types were placed in this encounter.  Meds ordered this encounter  Medications   tamsulosin (FLOMAX) 0.4 MG CAPS capsule    Sig: Take 1 capsule (0.4 mg total) by mouth daily.    Dispense:  30 capsule    Refill:  0     Chief Complaint  Patient presents with   Follow-up   Anticoagulation    History of Present Illness:    Andre Cervantes is a 35 y.o. 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 Was seen [facility] ED 04/27/2020 for palpitation high-sensitivity troponin was normal he had no evidence of  arrhythmia in the emergency room his EKG was described as sinus rhythm 70 bpm and normal.  Compliance with diet, lifestyle and medications: Yes he is supervised by his father  This turned into a very complex visit with a great deal of information that needed to be reviewed and patient that I can never seen him before. He is pending orthopedic surgery very high risk with lupus anticoagulant and recurrent pulmonary embolism and will need to keep his interruption of direct anticoagulant brief I have cut-and-paste recommendations and he should hold his anticoagulant for full 2 full days prior to surgery and reinstitute postoperatively of 24 to 48 hours he does not need bridging anticoagulation His complaint is lightheadedness especially outdoors standing he is taking an alpha-blocker tamsulosin is taking both metoprolol and atenolol.  He tells me he gets episodes where his heart feels rapid.  Organ to limit 1 beta-blocker reduce his alpha-blocker 50% I gave the father blood pressure cuff to check blood pressure sitting and standing daily for 3 weeks bring to my office and we will apply a 7-day ZIO monitor to assess for arrhythmia.  He has episodes daily and feels comfortable we will catch 1 of these He is not having chest pain syncope shortness of breath.  Request for surgical clearance: What type of surgery is being performed? Right knee patellofemoral arthroplasty When is this surgery scheduled? TBD What type of clearance is required (medical clearance vs. Pharmacy clearance to hold med vs. Both)? Both  Are there any medications that need to be held prior to surgery and how long? Eliquis Practice name and name of physician performing surgery? [facility] Dr. Hart Robinsons What is the office phone number? 7.   What is the office fax number? [phone] attention Kerri Maze 8.   Anesthesia type (None, local, MAC, general) ? Spinal  From Up To Date:  Myoview 10/13/2018: Study Highlights  The  left ventricular ejection fraction is hyperdynamic (>65%). Nuclear stress EF: 68%. There was no ST segment deviation noted during stress. Defect 1: There is a small defect of mild severity present in the basal inferior location. The study is normal. This is a low risk study.  Cardiac CTA 01/29/2018: IMPRESSION: 1. Coronary calcium score of 0. This was 0 percentile for age and sex matched control. 2. Normal coronary origin with right dominance. 3. No evidence of CAD.  Chest CTA 01/07/2017: IMPRESSION: 1. Large saddle pulmonary embolus, extending into all lobes of both lungs. RV/LV ratio is 0.84. 2. Mildly mosaic pattern of parenchymal attenuation within both lungs, without definite evidence of pulmonary infarct at this time.  Component Ref Range & Units 9 yr ago   PTT Lupus Anticoagulant 28.0 - 43.0 secs 56.3 High    PTTLA Confirmation <8.0 secs 11.1 High    PTTLA 4:1 Mix 28.0 - 43.0 secs 57.5 High    DRVVT <45.1 secs 38.6   Drvvt confirmation <1.16 Ratio NOT APPL   dRVVT Incubated 1:1 Mix <45.1 secs NOT APPL   Lupus Anticoagulant NOT DETECTED DETECTED    Component Ref Range & Units 2 yr ago   Cholesterol, Total 100 - 199 mg/dL 117   Triglycerides 0 - 149 mg/dL 90   HDL >39 mg/dL 33 Low    VLDL Cholesterol Cal 5 - 40 mg/dL 18   LDL Calculated 0 - 99 mg/dL 66   Chol/HDL Ratio 0.0 - 5.0 ratio 3.5    Past Medical History:  Diagnosis Date   Acute back pain with sciatica 07/05/2017   ADD (attention deficit disorder)    Anxiety state 11/07/2009   Qualifier: Diagnosis of  By: Nils Pyle CMA (AAMA), Riki Rusk of this note might be different from the original. Overview:  Qualifier: Diagnosis of  By: Nils Pyle CMA Deborra Medina), Leisha   Arthritis    Attention deficit hyperactivity disorder (ADHD) 11/07/2009   Qualifier: Diagnosis of  By: Nils Pyle CMA (AAMA), Riki Rusk of this note might be different from the original. Overview:  Qualifier: Diagnosis of  By: Nils Pyle CMA (AAMA),  Leisha   Chest tightness 08/16/2017   Dental infection 01/17/2017   Depression 11/07/2009   Qualifier: Diagnosis of  By: Nils Pyle CMA (AAMA), Riki Rusk of this note might be different from the original. Overview:  Qualifier: Diagnosis of  By: Nils Pyle CMA Deborra Medina), Mearl Latin   DOE (dyspnea on exertion) 12/16/2017   DVT (deep venous thrombosis) ([place]) 2013   Fusion of lumbar spine    GERD 11/07/2009   Qualifier: Diagnosis of  By: Nils Pyle CMA (AAMA), Riki Rusk of this note might be different from the original. Overview:  Qualifier: Diagnosis of  By: Nils Pyle CMA (AAMA), Mearl Latin   GERD (gastroesophageal reflux disease)    HCAP (healthcare-associated pneumonia)    High cholesterol    History of lumbar fusion 07/05/2017   History of pulmonary embolism 08/16/2017   History of pulmonary embolus (PE) 05/23/2019   Hyperlipidemia 11/07/2009   Qualifier: Diagnosis of  By: Nils Pyle CMA (AAMA), Riki Rusk of this note might be different from the original. 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 Overview:  Qualifier: Diagnosis of  By: Nils Pyle CMA Deborra Medina), Mearl Latin    Past Surgical History:  Procedure Laterality Date   BACK SURGERY     cyst removed      Head dermatologist removed in office   FRACTURE SURGERY      rt hand   KNEE ARTHROSCOPY WITH LATERAL RELEASE Right 01/05/2013   Procedure: KNEE ARTHROSCOPY WITH DEBRIDEMENT, LATERAL RELEASE, CARTICEL PROCEDURE, OPEN ALLOGRAFT;  Surgeon: Sydnee Cabal, MD;  Location: [place];  Service: Orthopedics;  Laterality: Right;   KNEE SURGERY Bilateral 2013    knee   MEDIAL PATELLOFEMORAL LIGAMENT REPAIR Right 01/05/2013   Procedure: MEDIAL PATELLA FEMORAL LIGAMENT RECONSTRUCTION;  Surgeon: Sydnee Cabal, MD;  Location: [place];  Service: Orthopedics;  Laterality: Right;   rt hand little finger pinning  childhood   TOTAL KNEE ARTHROPLASTY Left 02/24/2019   Procedure: Knee allograft MPFL reconstruction patellofemoral arthroplasty;  Surgeon: Sydnee Cabal, MD;  [facility];  Service: Orthopedics;  Laterality: Left;  adductor canal    Current Medications: Current Meds  Medication Sig   apixaban (ELIQUIS) 5 MG TABS tablet Take 5 mg by mouth 2 (two) times daily.   atenolol (TENORMIN) 50 MG tablet Take 50 mg by mouth daily.   atorvastatin (LIPITOR) 40 MG tablet Take 1 tablet by mouth daily.   benztropine (COGENTIN) 1 MG tablet Take 1 mg by mouth daily.   DULoxetine (CYMBALTA) 30 MG capsule Take 30 mg by mouth at bedtime.   fenofibrate micronized (LOFIBRA) 134 MG capsule Take 134 mg by mouth at bedtime.    fluticasone (FLONASE) 50 MCG/ACT nasal spray Place 1 spray into both nostrils as needed for rhinitis or allergies.   gabapentin (NEURONTIN) 100 MG capsule Take 100 mg by mouth 4 (four) times daily.   loratadine (CLARITIN) 10 MG tablet Take 10 mg by mouth daily.   methocarbamol (ROBAXIN) 500 MG tablet Take 1 tablet (500 mg total) by mouth 4 (four) times daily.   nitroGLYCERIN (NITROSTAT) 0.4 MG SL tablet Place 1 tablet (0.4 mg total) under the tongue every 5 (five) minutes as needed for chest pain.   omeprazole (PRILOSEC) 20 MG capsule Take 20 mg by mouth daily.   risperiDONE (RISPERDAL) 2 MG  tablet Take 6 mg by mouth daily.   topiramate (TOPAMAX) 100 MG tablet Take 100 mg by mouth at bedtime.    [DISCONTINUED] metoprolol tartrate (LOPRESSOR) 25 MG tablet TAKE 1 TABLET (25 MG TOTAL) BY MOUTH DAILY AS NEEDED (PALPITATIONS).   [DISCONTINUED] tamsulosin (FLOMAX) 0.4 MG CAPS capsule  Take 0.4 mg by mouth 2 (two) times daily.      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 ROS:   Please see the history of present illness.    All other systems reviewed and are negative.  EKGs/Labs/Other Studies Reviewed:    The following studies were reviewed today:    Recent Labs: 04/27/2020 hemoglobin 12.0 platelet count 365,000 Recent Lipid Panel    Component Value Date/Time   CHOL 117 03/16/2018 0817   TRIG 90 03/16/2018 0817   HDL 33 (L) 03/16/2018 0817   CHOLHDL 3.5 03/16/2018 0817   LDLCALC 66 03/16/2018 0817    Physical Exam:    VS:  BP 90/72 (BP  Location: Right Arm, Patient Position: Sitting, Cuff Size: Normal)   Pulse 74   Ht 5' 9.6" (1.768 m)   Wt 177 lb (80.3 kg)   SpO2 99%   BMI 25.69 kg/m     Wt Readings from Last 3 Encounters:  12/11/20 177 lb (80.3 kg)  06/14/20 172 lb 9.6 oz (78.3 kg)  05/23/19 166 lb 3.2 oz (75.4 kg)     GEN:  Well nourished, well developed in no acute distress HEENT: Normal NECK: No JVD; No carotid bruits LYMPHATICS: No lymphadenopathy CARDIAC: RRR, no murmurs, rubs, gallops RESPIRATORY:  Clear to auscultation without rales, wheezing or rhonchi  ABDOMEN: Soft, non-tender, non-distended MUSCULOSKELETAL:  No edema; No deformity  SKIN: Warm and dry NEUROLOGIC:  Alert and oriented x 3 PSYCHIATRIC:  Normal affect    Signed, Shirlee More, MD  12/11/2020 9:08 AM    Andre Cervantes

## 2020-12-11 NOTE — Patient Instructions (Addendum)
Medication Instructions:  Your physician has recommended you make the following change in your medication:  Decrease: Flomax to once daily   Stop: metoprolol   STOP: Fenofibrate  *If you need a refill on your cardiac medications before your next appointment, please call your pharmacy*   Lab Work: None If you have labs (blood work) drawn today and your tests are completely normal, you will receive your results only by: [place] (if you have MyChart) OR A paper copy in the mail If you have any lab test that is abnormal or we need to change your treatment, we will call you to review the results.   Testing/Procedures: A zio monitor was ordered today. It will remain on for 7 days. You will then return monitor and event diary in provided box. It takes 1-2 weeks for report to be downloaded and returned to [place]. We will call you with the results. If monitor falls off or has orange flashing light, please call Zio for further instructions.    Follow-Up: At [facility], you and your health needs are our priority.  As part of our continuing mission to provide you with exceptional heart care, we have created designated Provider Care Teams.  These Care Teams include your primary Cardiologist (physician) and Advanced Practice Providers (APPs -  Physician Assistants and Nurse Practitioners) who all work together to provide you with the care you need, when you need it.  We recommend signing up for the patient portal called "MyChart".  Sign up information is provided on this After Visit Summary.  MyChart is used to connect with patients for Virtual Visits (Telemedicine).  Patients are able to view lab/test results, encounter notes, upcoming appointments, etc.  Non-urgent messages can be sent to your provider as well.   To learn more about what you can do with MyChart, go to [email or web address].    Your next appointment:   6 week(s)  The format for your next appointment:   In  Person  Provider:   Shirlee More, MD    Other Instructions  Check blood pressure daily while sitting and standing - keep a log for 3 weeks and bring to [place].

## 2020-12-23 ENCOUNTER — Other Ambulatory Visit (HOSPITAL_BASED_OUTPATIENT_CLINIC_OR_DEPARTMENT_OTHER): Payer: Self-pay

## 2020-12-23 MED ORDER — PFIZER COVID-19 VAC BIVALENT 30 MCG/0.3ML IM SUSP
INTRAMUSCULAR | 0 refills | Status: DC
  Filled 2020-12-23: qty 0.3, 1d supply, fill #0

## 2020-12-27 NOTE — Telephone Encounter (Signed)
Patient called to check on status of clearance. His surgeon is just waiting on clearance from [place] to schedule his surgery.

## 2020-12-27 NOTE — Telephone Encounter (Signed)
   Primary Cardiologist: Shirlee More, MD  Chart reviewed as part of pre-operative protocol coverage. Given past medical history and time since last visit, based on ACC/AHA guidelines, Andre Cervantes would be at acceptable risk for the planned procedure without further cardiovascular testing.   Pt takes Eliquis for saddle PE in 2018, DVT/PE in 2013, hypercoagulable with lupus anticoagulant, being managed by primary care.    Dr. Bettina Gavia made the following recommendations:  His Eliquis may be held for 2 days prior to his surgery and resumed 24-48 hours postoperatively.  He does not require bridging.  I will route this recommendation to the requesting party via Epic fax function and remove from pre-op pool.  Please call with questions.  Jossie Ng. Appolonia Ackert NP-C    12/27/2020, 12:37 PM [address] Office [phone] Fax [phone]

## 2020-12-27 NOTE — Telephone Encounter (Addendum)
Pt takes Eliquis for saddle PE in 2018, DVT/PE in 2013, hypercoagulable with lupus anticoagulant, being managed by primary care.   Dr Bettina Gavia cleared pt in his 11/16 office note - "I have cut-and-paste recommendations and he should hold his anticoagulant for full 2 full days prior to surgery and reinstitute postoperatively of 24 to 48 hours he does not need bridging anticoagulation."

## 2020-12-31 ENCOUNTER — Telehealth: Payer: Self-pay | Admitting: Cardiology

## 2020-12-31 NOTE — Telephone Encounter (Signed)
Patient would like the results to his monitor.  He would also like to know if he has been cleared for surgery.

## 2021-01-06 ENCOUNTER — Telehealth: Payer: Self-pay | Admitting: Cardiology

## 2021-01-06 NOTE — Telephone Encounter (Signed)
Left message for pt to call back  °

## 2021-01-06 NOTE — Telephone Encounter (Signed)
Follow Up:    Dr Martinique  wants to know if Dr Bettina Gavia have cleared this patient for his surgery?

## 2021-01-09 NOTE — Telephone Encounter (Signed)
Dr. Martinique informed of Dr. Joya Gaskins note below that "patient is cleared".
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
## 2021-02-03 ENCOUNTER — Other Ambulatory Visit: Payer: Self-pay

## 2021-02-03 ENCOUNTER — Ambulatory Visit (INDEPENDENT_AMBULATORY_CARE_PROVIDER_SITE_OTHER): Payer: Commercial Managed Care - HMO | Admitting: Cardiology

## 2021-02-03 ENCOUNTER — Encounter: Payer: Self-pay | Admitting: Cardiology

## 2021-02-03 VITALS — BP 80/58 | HR 76 | Ht 69.0 in | Wt 183.0 lb

## 2021-02-03 DIAGNOSIS — R760 Raised antibody titer: Secondary | ICD-10-CM

## 2021-02-03 DIAGNOSIS — Z7901 Long term (current) use of anticoagulants: Secondary | ICD-10-CM

## 2021-02-03 DIAGNOSIS — I441 Atrioventricular block, second degree: Secondary | ICD-10-CM

## 2021-02-03 DIAGNOSIS — Z86711 Personal history of pulmonary embolism: Secondary | ICD-10-CM

## 2021-02-03 DIAGNOSIS — R42 Dizziness and giddiness: Secondary | ICD-10-CM | POA: Diagnosis not present

## 2021-02-03 NOTE — Patient Instructions (Signed)
Medication Instructions:  Your physician recommends that you continue on your current medications as directed. Please refer to the Current Medication list given to you today.  *If you need a refill on your cardiac medications before your next appointment, please call your pharmacy*   Lab Work: None If you have labs (blood work) drawn today and your tests are completely normal, you will receive your results only by: [place] (if you have MyChart) OR A paper copy in the mail If you have any lab test that is abnormal or we need to change your treatment, we will call you to review the results.   Testing/Procedures: None   Follow-Up: At [facility], you and your health needs are our priority.  As part of our continuing mission to provide you with exceptional heart care, we have created designated Provider Care Teams.  These Care Teams include your primary Cardiologist (physician) and Advanced Practice Providers (APPs -  Physician Assistants and Nurse Practitioners) who all work together to provide you with the care you need, when you need it.  We recommend signing up for the patient portal called "MyChart".  Sign up information is provided on this After Visit Summary.  MyChart is used to connect with patients for Virtual Visits (Telemedicine).  Patients are able to view lab/test results, encounter notes, upcoming appointments, etc.  Non-urgent messages can be sent to your provider as well.   To learn more about what you can do with MyChart, go to [email or web address].    Your next appointment:    Follow up as needed.  The format for your next appointment:     Provider:   Shirlee More, MD    Other Instructions None

## 2021-03-26 ENCOUNTER — Ambulatory Visit: Admit: 2021-03-26 | Payer: Medicare Other | Admitting: Specialist

## 2021-04-04 ENCOUNTER — Other Ambulatory Visit: Payer: Self-pay | Admitting: Cardiology

## 2021-05-12 ENCOUNTER — Other Ambulatory Visit (HOSPITAL_COMMUNITY): Payer: Commercial Managed Care - HMO

## 2021-05-21 ENCOUNTER — Ambulatory Visit: Admit: 2021-05-21 | Payer: Commercial Managed Care - HMO | Admitting: Specialist

## 2021-05-21 SURGERY — ARTHROPLASTY, PATELLOFEMORAL
Anesthesia: Spinal | Site: Knee | Laterality: Right

## 2021-06-02 ENCOUNTER — Telehealth: Payer: Self-pay

## 2021-06-03 ENCOUNTER — Telehealth: Payer: Self-pay

## 2021-06-03 NOTE — Telephone Encounter (Signed)
Please inform the patient to hold Eliquis for 2 days prior to the procedure and resume 24 to 48 hours after the procedure. ?

## 2021-06-03 NOTE — Telephone Encounter (Signed)
? ?  Pre-operative Risk Assessment  ?  ?Patient Name: Andre Cervantes  ?DOB: 29-Oct-1985 ?MRN: [id number]  ? ?  ? ?Request for Surgical Clearance   ? ?Procedure:   right patellofemoral arthroplasty ? ?Date of Surgery:  Clearance TBD                              ?   ?Surgeon:  Dr. Jonn Shingles ?Surgeon's Group or Practice Name:  [facility] ?Phone number:  [phone] ?Fax number:  [phone] Attention Marianna Fuss Maze ?  ?Type of Clearance Requested:   ?- Medical  ?  ?Type of Anesthesia:   Spinal with adductor can ?  ?Additional requests/questions:   ? ?Signed, ?Aiyanna Awtrey Tressa Busman   ?06/03/2021, 9:28 AM  ? ?

## 2021-06-03 NOTE — Telephone Encounter (Signed)
Opened in error

## 2021-06-03 NOTE — Telephone Encounter (Signed)
? ? ?  Patient Name: Andre Cervantes  ?DOB: Apr 03, 1985 ?MRN: [id number] ? ?Primary Cardiologist: Shirlee More, MD ? ?Chart reviewed as part of pre-operative protocol coverage. Given past medical history and time since last visit, based on ACC/AHA guidelines, Andre Cervantes would be at acceptable risk for the planned procedure without further cardiovascular testing.  ? ?Patient was previously cleared for the same procedure in Nov 2022.  ? ?His Eliquis may be held for 2 days prior to his surgery and resumed 24-48 hours postoperatively.  He does not require bridging. ? ?I will route this recommendation to the requesting party via Epic fax function and remove from pre-op pool. ? ?Please call with questions. ? ?Almyra Deforest, [place] ?06/03/2021, 2:49 PM ? ?

## 2021-06-03 NOTE — Telephone Encounter (Signed)
Patient has been made aware of recommendations. Pt verbalized understanding and thanked me for the call.  ?

## 2021-08-04 NOTE — Progress Notes (Signed)
Sent message, via epic in basket, requesting order in epic from surgeon     08/04/21 1334  Preop Orders  Has preop orders? No  Name of staff/physician contacted for orders(Indicate phone or IB message) L. Haus, PA-C.

## 2021-08-07 NOTE — Patient Instructions (Addendum)
DUE TO COVID-19 ONLY TWO VISITORS  (aged 35 and older)  ARE ALLOWED TO COME WITH YOU AND STAY IN THE WAITING ROOM ONLY DURING PRE OP AND PROCEDURE.   **NO VISITORS ARE ALLOWED IN THE SHORT STAY AREA OR RECOVERY ROOM!!**  IF YOU WILL BE ADMITTED INTO THE HOSPITAL YOU ARE ALLOWED ONLY FOUR SUPPORT PEOPLE DURING VISITATION HOURS ONLY (7 AM -8PM)   The support person(s) must pass our screening, gel in and out, and wear a mask at all times, including in the patient's room. Patients must also wear a mask when staff or their support person are in the room. Visitors GUEST BADGE MUST BE WORN VISIBLY  One adult visitor may remain with you overnight and MUST be in the room by 8 P.M.     Your procedure is scheduled on: 08/20/21   Report to [facility] Main Entrance    Report to admitting at   7:50 AM   Call this number if you have problems the morning of surgery [phone]   Do not eat food :After Midnight.   After Midnight you may have the following liquids until _7:00_____ AM/  DAY OF SURGERY  Water Black Coffee (sugar ok, NO MILK/CREAM OR CREAMERS)  Tea (sugar ok, NO MILK/CREAM OR CREAMERS) regular and decaf                             Plain Jell-O (NO RED)                                           Fruit ices (not with fruit pulp, NO RED)                                     Popsicles (NO RED)                                                                  Juice: apple, WHITE grape, WHITE cranberry Sports drinks like Gatorade (NO RED) Clear broth(vegetable,chicken,beef)                   The day of surgery:  Drink ONE (1) Pre-Surgery Clear Ensure at  6:45 AM the morning of surgery. Drink in one sitting. Do not sip.  This drink was given to you during your hospital  pre-op appointment visit. Nothing else to drink after completing the  Pre-Surgery Clear Ensure at 7:00 AM          If you have questions, please contact your surgeon's office.       Oral Hygiene is also  important to reduce your risk of infection.                                    Remember - BRUSH YOUR TEETH THE MORNING OF SURGERY WITH YOUR REGULAR TOOTHPASTE   Do NOT smoke after Midnight   Take these medicines the morning of surgery with A SIP OF WATER:  Topamax, Lipitor, Cogentin, Atenolol, Flomax, Omeprazole, Risperidone    Before surgery.Stop taking _Eliquis__________on 7/24/23__________as instructed by _Dr. Collins____________.   Contact your Surgeon/Cardiologist for instructions on Anticoagulant Therapy prior to surgery.                                You may not have any metal on your body including jewelry, and body piercing             lotions, powders, perfumes/cologne, or deodorant               Men may shave face and neck.   Do not bring valuables to the hospital. [place].   Contacts, dentures or bridgework may not be worn into surgery.      Patients discharged on the day of surgery will not be allowed to drive home.                          Someone NEEDS to stay with you for the first 24 hours after anesthesia.   Special Instructions: Bring a copy of your healthcare power of attorney and living will documents the day of surgery if you haven't scanned them before.              Please read over the following fact sheets you were given: IF YOU HAVE QUESTIONS ABOUT YOUR PRE-OP INSTRUCTIONS PLEASE CALL [phone]     [facility] - Preparing for Surgery Before surgery, you can play an important role.  Because skin is not sterile, your skin needs to be as free of germs as possible.  You can reduce the number of germs on your skin by washing with CHG (chlorahexidine gluconate) soap before surgery.  CHG is an antiseptic cleaner which kills germs and bonds with the skin to continue killing germs even after washing. Please DO NOT use if you have an allergy to CHG or antibacterial soaps.  If your skin becomes  reddened/irritated stop using the CHG and inform your nurse when you arrive at Short Stay. You may shave your face/neck. Please follow these instructions carefully:  1.  Shower with CHG Soap the night before surgery and the  morning of Surgery.  2.  If you choose to wash your hair, wash your hair first as usual with your  normal  shampoo.  3.  After you shampoo, rinse your hair and body thoroughly to remove the  shampoo.                            4.  Use CHG as you would any other liquid soap.  You can apply chg directly  to the skin and wash                       Gently with a scrungie or clean washcloth.  5.  Apply the CHG Soap to your body ONLY FROM THE NECK DOWN.   Do not use on face/ open                           Wound or open sores. Avoid contact with eyes, ears mouth and genitals (private parts).  Wash face,  Genitals (private parts) with your normal soap.             6.  Wash thoroughly, paying special attention to the area where your surgery  will be performed.  7.  Thoroughly rinse your body with warm water from the neck down.  8.  DO NOT shower/wash with your normal soap after using and rinsing off  the CHG Soap.                9.  Pat yourself dry with a clean towel.            10.  Wear clean pajamas.            11.  Place clean sheets on your bed the night of your first shower and do not  sleep with pets. Day of Surgery : Do not apply any lotions/deodorants the morning of surgery.  Please wear clean clothes to the hospital/surgery center.  FAILURE TO FOLLOW THESE INSTRUCTIONS MAY RESULT IN THE CANCELLATION OF YOUR SURGERY   ________________________________________________________________________   Incentive Spirometer  An incentive spirometer is a tool that can help keep your lungs clear and active. This tool measures how well you are filling your lungs with each breath. Taking long deep breaths may help reverse or decrease the chance of developing  breathing (pulmonary) problems (especially infection) following: A long period of time when you are unable to move or be active. BEFORE THE PROCEDURE  If the spirometer includes an indicator to show your best effort, your nurse or respiratory therapist will set it to a desired goal. If possible, sit up straight or lean slightly forward. Try not to slouch. Hold the incentive spirometer in an upright position. INSTRUCTIONS FOR USE  Sit on the edge of your bed if possible, or sit up as far as you can in bed or on a chair. Hold the incentive spirometer in an upright position. Breathe out normally. Place the mouthpiece in your mouth and seal your lips tightly around it. Breathe in slowly and as deeply as possible, raising the piston or the ball toward the top of the column. Hold your breath for 3-5 seconds or for as long as possible. Allow the piston or ball to fall to the bottom of the column. Remove the mouthpiece from your mouth and breathe out normally. Rest for a few seconds and repeat Steps 1 through 7 at least 10 times every 1-2 hours when you are awake. Take your time and take a few normal breaths between deep breaths. The spirometer may include an indicator to show your best effort. Use the indicator as a goal to work toward during each repetition. After each set of 10 deep breaths, practice coughing to be sure your lungs are clear. If you have an incision (the cut made at the time of surgery), support your incision when coughing by placing a pillow or rolled up towels firmly against it. Once you are able to get out of bed, walk around indoors and cough well. You may stop using the incentive spirometer when instructed by your caregiver.  RISKS AND COMPLICATIONS Take your time so you do not get dizzy or light-headed. If you are in pain, you may need to take or ask for pain medication before doing incentive spirometry. It is harder to take a deep breath if you are having pain. AFTER USE Rest  and breathe slowly and easily. It can be helpful to keep track of a log of  your progress. Your caregiver can provide you with a simple table to help with this. If you are using the spirometer at home, follow these instructions: [place] IF:  You are having difficultly using the spirometer. You have trouble using the spirometer as often as instructed. Your pain medication is not giving enough relief while using the spirometer. You develop fever of 100.5 F (38.1 C) or higher. SEEK IMMEDIATE MEDICAL CARE IF:  You cough up bloody sputum that had not been present before. You develop fever of 102 F (38.9 C) or greater. You develop worsening pain at or near the incision site. MAKE SURE YOU:  Understand these instructions. Will watch your condition. Will get help right away if you are not doing well or get worse. Document Released: 05/25/2006 Document Revised: 04/06/2011 Document Reviewed: 07/26/2006 [facility] Patient Information 2014 [place], [place].   ________________________________________________________________________

## 2021-08-11 ENCOUNTER — Other Ambulatory Visit: Payer: Self-pay

## 2021-08-11 ENCOUNTER — Encounter (HOSPITAL_COMMUNITY)
Admission: RE | Admit: 2021-08-11 | Discharge: 2021-08-11 | Disposition: A | Discharge status: Home / Self Care | Payer: Medicare Other | Source: Ambulatory Visit | Attending: Specialist | Admitting: Specialist

## 2021-08-11 ENCOUNTER — Encounter (HOSPITAL_COMMUNITY): Payer: Self-pay

## 2021-08-11 DIAGNOSIS — M1711 Unilateral primary osteoarthritis, right knee: Secondary | ICD-10-CM | POA: Insufficient documentation

## 2021-08-11 DIAGNOSIS — I10 Essential (primary) hypertension: Secondary | ICD-10-CM | POA: Insufficient documentation

## 2021-08-11 DIAGNOSIS — Z01818 Encounter for other preprocedural examination: Secondary | ICD-10-CM

## 2021-08-11 HISTORY — DX: Other symptoms and signs involving cognitive functions and awareness: R41.89

## 2021-08-11 LAB — CBC
HCT: 43.7 % (ref 39.0–52.0)
Hemoglobin: 14 g/dL (ref 13.0–17.0)
MCH: 30.4 pg (ref 26.0–34.0)
MCHC: 32 g/dL (ref 30.0–36.0)
MCV: 95 fL (ref 80.0–100.0)
Platelets: 418 10*3/uL — ABNORMAL HIGH (ref 150–400)
RBC: 4.6 MIL/uL (ref 4.22–5.81)
RDW: 12.7 % (ref 11.5–15.5)
WBC: 9.1 10*3/uL (ref 4.0–10.5)
nRBC: 0 % (ref 0.0–0.2)

## 2021-08-11 LAB — COMPREHENSIVE METABOLIC PANEL
ALT: 53 U/L — ABNORMAL HIGH (ref 0–44)
AST: 41 U/L (ref 15–41)
Albumin: 4 g/dL (ref 3.5–5.0)
Alkaline Phosphatase: 38 U/L (ref 38–126)
Anion gap: 5 (ref 5–15)
BUN: 13 mg/dL (ref 6–20)
CO2: 19 mmol/L — ABNORMAL LOW (ref 22–32)
Calcium: 9.3 mg/dL (ref 8.9–10.3)
Chloride: 114 mmol/L — ABNORMAL HIGH (ref 98–111)
Creatinine, Ser: 1.44 mg/dL — ABNORMAL HIGH (ref 0.61–1.24)
GFR, Estimated: >60 mL/min (ref >60)
Glucose, Bld: 104 mg/dL — ABNORMAL HIGH (ref 70–99)
Potassium: 4 mmol/L (ref 3.5–5.1)
Sodium: 138 mmol/L (ref 135–145)
Total Bilirubin: 0.3 mg/dL (ref 0.3–1.2)
Total Protein: 7.2 g/dL (ref 6.5–8.1)

## 2021-08-11 LAB — SURGICAL PCR SCREEN
MRSA, PCR: NEGATIVE
Staphylococcus aureus: POSITIVE — AB

## 2021-08-11 NOTE — H&P (View-Only) (Signed)
Anesthesia note:  Bowel prep reminder:NA  PCP - Dr. S. Martinique Cardiologist -Dr. Rinaldo Cloud Other-   Chest x-ray - no EKG - 08/11/21-chart Stress Test - no ECHO - no Cardiac Cath - NA  Pacemaker/ICD device last checked:NA  Sleep Study - no CPAP -   Pt is pre diabetic-NA Fasting Blood Sugar -  Checks Blood Sugar _____  Blood Thinner:Eliquis for Thrombocytosis Blood Thinner Instructions:stop 3 days prior to DOS /Dr. Theda Sers Aspirin Instructions: Last Dose:08/17/21  Anesthesia review: yes  Patient denies shortness of breath, fever, cough and chest pain at PAT appointment Pt has no SOB with regular activities.  Patient verbalized understanding of instructions that were given to them at the PAT appointment. Patient was also instructed that they will need to review over the PAT instructions again at home before surgery. Pt lives with his parents and will show instructions to them .

## 2021-08-11 NOTE — Progress Notes (Signed)
Anesthesia note:  Bowel prep reminder:NA  PCP - Dr. S. Martinique Cardiologist -Dr. Rinaldo Cloud Other-   Chest x-ray - no EKG - 08/11/21-chart Stress Test - no ECHO - no Cardiac Cath - NA  Pacemaker/ICD device last checked:NA  Sleep Study - no CPAP -   Pt is pre diabetic-NA Fasting Blood Sugar -  Checks Blood Sugar _____  Blood Thinner:Eliquis for Thrombocytosis Blood Thinner Instructions:stop 3 days prior to DOS /Dr. Theda Sers Aspirin Instructions: Last Dose:08/17/21  Anesthesia review: yes  Patient denies shortness of breath, fever, cough and chest pain at PAT appointment Pt has no SOB with regular activities.  Patient verbalized understanding of instructions that were given to them at the PAT appointment. Patient was also instructed that they will need to review over the PAT instructions again at home before surgery. Pt lives with his parents and will show instructions to them .

## 2021-08-20 ENCOUNTER — Ambulatory Visit (HOSPITAL_COMMUNITY)
Admission: RE | Admit: 2021-08-20 | Discharge: 2021-08-20 | Disposition: A | Discharge status: Home / Self Care | Payer: Medicare Other | Attending: Specialist | Admitting: Specialist

## 2021-08-20 ENCOUNTER — Ambulatory Visit (HOSPITAL_COMMUNITY): Payer: Medicare Other | Admitting: Physician Assistant

## 2021-08-20 ENCOUNTER — Ambulatory Visit (HOSPITAL_BASED_OUTPATIENT_CLINIC_OR_DEPARTMENT_OTHER): Payer: Medicare Other | Admitting: Certified Registered Nurse Anesthetist

## 2021-08-20 ENCOUNTER — Encounter (HOSPITAL_COMMUNITY): Payer: Self-pay | Admitting: Specialist

## 2021-08-20 ENCOUNTER — Other Ambulatory Visit: Payer: Self-pay

## 2021-08-20 ENCOUNTER — Encounter (HOSPITAL_COMMUNITY)
Admission: RE | Disposition: A | Discharge status: Home / Self Care | Payer: Self-pay | Source: Home / Self Care | Attending: Specialist

## 2021-08-20 DIAGNOSIS — Z87891 Personal history of nicotine dependence: Secondary | ICD-10-CM | POA: Insufficient documentation

## 2021-08-20 DIAGNOSIS — Z86711 Personal history of pulmonary embolism: Secondary | ICD-10-CM | POA: Diagnosis not present

## 2021-08-20 DIAGNOSIS — M1711 Unilateral primary osteoarthritis, right knee: Secondary | ICD-10-CM | POA: Insufficient documentation

## 2021-08-20 DIAGNOSIS — Z86718 Personal history of other venous thrombosis and embolism: Secondary | ICD-10-CM | POA: Diagnosis not present

## 2021-08-20 DIAGNOSIS — I10 Essential (primary) hypertension: Secondary | ICD-10-CM | POA: Diagnosis not present

## 2021-08-20 DIAGNOSIS — Z01818 Encounter for other preprocedural examination: Secondary | ICD-10-CM

## 2021-08-20 DIAGNOSIS — K219 Gastro-esophageal reflux disease without esophagitis: Secondary | ICD-10-CM | POA: Insufficient documentation

## 2021-08-20 HISTORY — PX: PATELLA-FEMORAL ARTHROPLASTY: SHX5037

## 2021-08-20 SURGERY — ARTHROPLASTY, PATELLOFEMORAL
Anesthesia: General | Site: Knee | Laterality: Right

## 2021-08-20 MED ORDER — EPHEDRINE 5 MG/ML INJ
INTRAVENOUS | Status: AC
  Filled 2021-08-20: qty 5

## 2021-08-20 MED ORDER — PHENYLEPHRINE 80 MCG/ML (10ML) SYRINGE FOR IV PUSH (FOR BLOOD PRESSURE SUPPORT)
PREFILLED_SYRINGE | INTRAVENOUS | Status: DC | PRN
  Administered 2021-08-20: 80 ug via INTRAVENOUS
  Administered 2021-08-20: 160 ug via INTRAVENOUS
  Administered 2021-08-20: 80 ug via INTRAVENOUS

## 2021-08-20 MED ORDER — DEXAMETHASONE SODIUM PHOSPHATE 10 MG/ML IJ SOLN
INTRAMUSCULAR | Status: AC
  Filled 2021-08-20: qty 1

## 2021-08-20 MED ORDER — SODIUM CHLORIDE (PF) 0.9 % IJ SOLN
INTRAMUSCULAR | Status: DC | PRN
  Administered 2021-08-20: 60 mL

## 2021-08-20 MED ORDER — HYDROMORPHONE HCL 1 MG/ML IJ SOLN
0.2500 mg | INTRAMUSCULAR | Status: DC | PRN
  Administered 2021-08-20: 0.5 mg via INTRAVENOUS

## 2021-08-20 MED ORDER — PROPOFOL 10 MG/ML IV BOLUS
INTRAVENOUS | Status: AC
  Filled 2021-08-20: qty 20

## 2021-08-20 MED ORDER — ONDANSETRON HCL 4 MG/2ML IJ SOLN
INTRAMUSCULAR | Status: AC
  Filled 2021-08-20: qty 2

## 2021-08-20 MED ORDER — SODIUM CHLORIDE 0.9 % IR SOLN
Status: DC | PRN
  Administered 2021-08-20: 1000 mL

## 2021-08-20 MED ORDER — BUPIVACAINE LIPOSOME 1.3 % IJ SUSP
INTRAMUSCULAR | Status: AC
  Filled 2021-08-20: qty 20

## 2021-08-20 MED ORDER — PROPOFOL 500 MG/50ML IV EMUL
INTRAVENOUS | Status: DC | PRN
  Administered 2021-08-20: 20 ug/kg/min via INTRAVENOUS

## 2021-08-20 MED ORDER — CHLORHEXIDINE GLUCONATE 0.12 % MT SOLN
15.0000 mL | Freq: Once | OROMUCOSAL | Status: AC
  Administered 2021-08-20: 15 mL via OROMUCOSAL

## 2021-08-20 MED ORDER — KETAMINE HCL 10 MG/ML IJ SOLN
INTRAMUSCULAR | Status: DC | PRN
  Administered 2021-08-20: 30 mg via INTRAVENOUS

## 2021-08-20 MED ORDER — MIDAZOLAM HCL 2 MG/2ML IJ SOLN
INTRAMUSCULAR | Status: AC
  Filled 2021-08-20: qty 2

## 2021-08-20 MED ORDER — CEFAZOLIN SODIUM-DEXTROSE 2-4 GM/100ML-% IV SOLN
2.0000 g | INTRAVENOUS | Status: AC
  Administered 2021-08-20: 2 g via INTRAVENOUS
  Filled 2021-08-20: qty 100

## 2021-08-20 MED ORDER — DEXAMETHASONE SODIUM PHOSPHATE 10 MG/ML IJ SOLN
INTRAMUSCULAR | Status: DC | PRN
  Administered 2021-08-20: 8 mg via INTRAVENOUS

## 2021-08-20 MED ORDER — ONDANSETRON HCL 4 MG PO TABS: 4.0000 mg | ORAL_TABLET | Freq: Every day | ORAL | 1 refills | Status: AC | PRN

## 2021-08-20 MED ORDER — OXYCODONE HCL 5 MG PO TABS
ORAL_TABLET | ORAL | Status: AC
  Filled 2021-08-20: qty 1

## 2021-08-20 MED ORDER — KETAMINE HCL 10 MG/ML IJ SOLN
INTRAMUSCULAR | Status: AC
Start: 2021-08-20 — End: ?
  Filled 2021-08-20: qty 1

## 2021-08-20 MED ORDER — PROPOFOL 10 MG/ML IV BOLUS
INTRAVENOUS | Status: DC | PRN
  Administered 2021-08-20: 160 mg via INTRAVENOUS

## 2021-08-20 MED ORDER — METHOCARBAMOL 500 MG PO TABS: 500.0000 mg | ORAL_TABLET | Freq: Four times a day (QID) | ORAL | 0 refills | Status: DC

## 2021-08-20 MED ORDER — HYDROMORPHONE HCL 1 MG/ML IJ SOLN
INTRAMUSCULAR | Status: AC
  Filled 2021-08-20: qty 1

## 2021-08-20 MED ORDER — ROPIVACAINE HCL 5 MG/ML IJ SOLN
INTRAMUSCULAR | Status: DC | PRN
  Administered 2021-08-20: 20 mL via PERINEURAL

## 2021-08-20 MED ORDER — OXYCODONE HCL 5 MG PO TABS: 5.0000 mg | ORAL_TABLET | ORAL | 0 refills | Status: AC | PRN

## 2021-08-20 MED ORDER — SODIUM CHLORIDE (PF) 0.9 % IJ SOLN
INTRAMUSCULAR | Status: AC
  Filled 2021-08-20: qty 50

## 2021-08-20 MED ORDER — HYDROMORPHONE HCL 1 MG/ML IJ SOLN
INTRAMUSCULAR | Status: DC | PRN
  Administered 2021-08-20: .2 mg via INTRAVENOUS
  Administered 2021-08-20: .4 mg via INTRAVENOUS
  Administered 2021-08-20: .2 mg via INTRAVENOUS

## 2021-08-20 MED ORDER — POVIDONE-IODINE 10 % EX SWAB
2.0000 | Freq: Once | CUTANEOUS | Status: AC
  Administered 2021-08-20: 2 via TOPICAL

## 2021-08-20 MED ORDER — CEPHALEXIN 500 MG PO CAPS: 500.0000 mg | ORAL_CAPSULE | Freq: Four times a day (QID) | ORAL | 0 refills | Status: AC

## 2021-08-20 MED ORDER — OXYCODONE HCL 5 MG/5ML PO SOLN: 5.0000 mg | Freq: Once | ORAL | Status: AC | PRN

## 2021-08-20 MED ORDER — OXYCODONE HCL 5 MG PO TABS
5.0000 mg | ORAL_TABLET | Freq: Once | ORAL | Status: AC | PRN
  Administered 2021-08-20: 5 mg via ORAL

## 2021-08-20 MED ORDER — STERILE WATER FOR IRRIGATION IR SOLN
Status: DC | PRN
  Administered 2021-08-20: 2000 mL

## 2021-08-20 MED ORDER — PROMETHAZINE HCL 25 MG/ML IJ SOLN: 6.2500 mg | INTRAMUSCULAR | Status: DC | PRN

## 2021-08-20 MED ORDER — MIDAZOLAM HCL 2 MG/2ML IJ SOLN
1.0000 mg | Freq: Once | INTRAMUSCULAR | Status: AC
  Administered 2021-08-20: 2 mg via INTRAVENOUS
  Filled 2021-08-20: qty 2

## 2021-08-20 MED ORDER — 0.9 % SODIUM CHLORIDE (POUR BTL) OPTIME
TOPICAL | Status: DC | PRN
  Administered 2021-08-20: 1000 mL

## 2021-08-20 MED ORDER — TRAMADOL HCL 50 MG PO TABS: 50.0000 mg | ORAL_TABLET | Freq: Four times a day (QID) | ORAL | 0 refills | Status: AC | PRN

## 2021-08-20 MED ORDER — HYDROMORPHONE HCL 2 MG/ML IJ SOLN
INTRAMUSCULAR | Status: AC
  Filled 2021-08-20: qty 1

## 2021-08-20 MED ORDER — MIDAZOLAM HCL 5 MG/5ML IJ SOLN
INTRAMUSCULAR | Status: DC | PRN
  Administered 2021-08-20: 2 mg via INTRAVENOUS

## 2021-08-20 MED ORDER — BUPIVACAINE LIPOSOME 1.3 % IJ SUSP
INTRAMUSCULAR | Status: DC | PRN
  Administered 2021-08-20: 20 mL

## 2021-08-20 MED ORDER — EPHEDRINE SULFATE-NACL 50-0.9 MG/10ML-% IV SOSY
PREFILLED_SYRINGE | INTRAVENOUS | Status: DC | PRN
  Administered 2021-08-20: 5 mg via INTRAVENOUS

## 2021-08-20 MED ORDER — LIDOCAINE HCL (PF) 2 % IJ SOLN
INTRAMUSCULAR | Status: DC | PRN
  Administered 2021-08-20: 80 mg via INTRADERMAL

## 2021-08-20 MED ORDER — LACTATED RINGERS IV SOLN: INTRAVENOUS | Status: DC

## 2021-08-20 MED ORDER — ORAL CARE MOUTH RINSE: 15.0000 mL | Freq: Once | OROMUCOSAL | Status: AC

## 2021-08-20 MED ORDER — SODIUM CHLORIDE (PF) 0.9 % IJ SOLN
INTRAMUSCULAR | Status: AC
Start: 2021-08-20 — End: ?
  Filled 2021-08-20: qty 10

## 2021-08-20 MED ORDER — ONDANSETRON HCL 4 MG/2ML IJ SOLN
INTRAMUSCULAR | Status: DC | PRN
  Administered 2021-08-20: 4 mg via INTRAVENOUS

## 2021-08-20 SURGICAL SUPPLY — 52 items
ADH SKN CLS APL DERMABOND .7 (GAUZE/BANDAGES/DRESSINGS) ×2
BAG COUNTER SPONGE SURGICOUNT (BAG) ×1 IMPLANT
BAG SPEC THK2 15X12 ZIP CLS (MISCELLANEOUS) ×1
BAG SPNG CNTER NS LX DISP (BAG) ×1
BAG ZIPLOCK 12X15 (MISCELLANEOUS) ×2 IMPLANT
BLADE SAG 18X100X1.27 (BLADE) ×2 IMPLANT
BNDG ELASTIC 4X5.8 VLCR STR LF (GAUZE/BANDAGES/DRESSINGS) ×2 IMPLANT
BNDG ELASTIC 6X5.8 VLCR STR LF (GAUZE/BANDAGES/DRESSINGS) ×2 IMPLANT
BOWL SMART MIX CTS (DISPOSABLE) ×2 IMPLANT
BUR SURG PFJ MILL NEXGEN (BURR) IMPLANT
BURR SURG PFJ MILL NEXGEN (BURR) ×2
CEMENT HV SMART SET (Cement) ×2 IMPLANT
COMP FEM NEXGEN SZ3 +3.5 RT (Knees) ×2 IMPLANT
COMPONENT FEM NEXGN SZ3 +3.5RT (Knees) IMPLANT
COVER SURGICAL LIGHT HANDLE (MISCELLANEOUS) ×2 IMPLANT
CUFF TOURN SGL QUICK 34 (TOURNIQUET CUFF) ×2
CUFF TRNQT CYL 34X4.125X (TOURNIQUET CUFF) ×1 IMPLANT
DERMABOND ADVANCED (GAUZE/BANDAGES/DRESSINGS) ×2
DERMABOND ADVANCED .7 DNX12 (GAUZE/BANDAGES/DRESSINGS) ×1 IMPLANT
DRAPE SHEET LG 3/4 BI-LAMINATE (DRAPES) ×2 IMPLANT
DRAPE U-SHAPE 47X51 STRL (DRAPES) ×2 IMPLANT
DRSG AQUACEL AG ADV 3.5X 6 (GAUZE/BANDAGES/DRESSINGS) ×2 IMPLANT
DRSG AQUACEL AG ADV 3.5X10 (GAUZE/BANDAGES/DRESSINGS) ×1 IMPLANT
DURAPREP 26ML APPLICATOR (WOUND CARE) ×4 IMPLANT
GLOVE BIOGEL PI IND STRL 7.5 (GLOVE) ×1 IMPLANT
GLOVE BIOGEL PI IND STRL 8 (GLOVE) ×1 IMPLANT
GLOVE BIOGEL PI INDICATOR 7.5 (GLOVE) ×1
GLOVE BIOGEL PI INDICATOR 8 (GLOVE) ×1
GLOVE ECLIPSE 8.0 STRL XLNG CF (GLOVE) ×2 IMPLANT
GLOVE SURG ORTHO 9.0 STRL STRW (GLOVE) ×2 IMPLANT
GLOVE SURG SS PI 7.0 STRL IVOR (GLOVE) ×2 IMPLANT
GOWN STRL REUS W/ TWL XL LVL3 (GOWN DISPOSABLE) ×2 IMPLANT
GOWN STRL REUS W/TWL XL LVL3 (GOWN DISPOSABLE) ×4
HANDPIECE INTERPULSE COAX TIP (DISPOSABLE) ×2
NS IRRIG 1000ML POUR BTL (IV SOLUTION) ×4 IMPLANT
PACK TOTAL KNEE CUSTOM (KITS) ×2 IMPLANT
PROTECTOR NERVE ULNAR (MISCELLANEOUS) ×2 IMPLANT
SCREW HEADED 33MM KNEE (MISCELLANEOUS) ×3 IMPLANT
SET HNDPC FAN SPRY TIP SCT (DISPOSABLE) ×1 IMPLANT
SPONGE SURGIFOAM ABS GEL 100 (HEMOSTASIS) ×1 IMPLANT
STEM POLY PAT PLY 32M KNEE (Knees) ×1 IMPLANT
SUT BONE WAX W31G (SUTURE) IMPLANT
SUT MNCRL AB 3-0 PS2 18 (SUTURE) ×2 IMPLANT
SUT VIC AB 1 CT1 27 (SUTURE) ×6
SUT VIC AB 1 CT1 27XBRD ANTBC (SUTURE) ×3 IMPLANT
SUT VIC AB 2-0 CT1 27 (SUTURE) ×4
SUT VIC AB 2-0 CT1 TAPERPNT 27 (SUTURE) ×2 IMPLANT
SUT VLOC 180 0 24IN GS25 (SUTURE) ×2 IMPLANT
TAPE STRIPS DRAPE STRL (GAUZE/BANDAGES/DRESSINGS) ×2 IMPLANT
TRAY FOLEY MTR SLVR 16FR STAT (SET/KITS/TRAYS/PACK) ×1 IMPLANT
WATER STERILE IRR 1000ML POUR (IV SOLUTION) ×2 IMPLANT
WRAP KNEE MAXI GEL POST OP (GAUZE/BANDAGES/DRESSINGS) ×2 IMPLANT

## 2021-08-20 NOTE — Anesthesia Preprocedure Evaluation (Addendum)
Anesthesia Evaluation  Patient identified by MRN, date of birth, ID band Patient awake    Reviewed: Allergy & Precautions, NPO status , Patient's Chart, lab work & pertinent test results  Airway Mallampati: II  TM Distance: >3 FB Neck ROM: Full    Dental  (+) Dental Advisory Given, Edentulous Upper, Edentulous Lower   Pulmonary pneumonia, resolved, PE   Pulmonary exam normal breath sounds clear to auscultation       Cardiovascular hypertension, Pt. on medications + DOE and + DVT  Normal cardiovascular exam Rhythm:Regular Rate:Normal     Neuro/Psych PSYCHIATRIC DISORDERS Anxiety Depression negative neurological ROS     GI/Hepatic Neg liver ROS, GERD  ,  Endo/Other  negative endocrine ROS  Renal/GU negative Renal ROS     Musculoskeletal  (+) Arthritis , Osteoarthritis,    Abdominal   Peds  (+) ATTENTION DEFICIT DISORDER WITHOUT HYPERACTIVITY Hematology  (+) Blood dyscrasia, anemia , Plt 393k  Last dose Eliquis 02/20/2019 pm Took Lovenox '120mg'$  SQ '@0900'$  on 02/23/2019   Anesthesia Other Findings Day of surgery medications reviewed with the patient.  Reproductive/Obstetrics                           Anesthesia Physical  Anesthesia Plan  ASA: 2  Anesthesia Plan: General   Post-op Pain Management:  Regional for Post-op pain and Regional block* and Minimal or no pain anticipated   Induction: Intravenous  PONV Risk Score and Plan: 1 and Propofol infusion, Midazolam, Ondansetron and Treatment may vary due to age or medical condition  Airway Management Planned: LMA  Additional Equipment: None  Intra-op Plan:   Post-operative Plan: Extubation in OR  Informed Consent: I have reviewed the patients History and Physical, chart, labs and discussed the procedure including the risks, benefits and alternatives for the proposed anesthesia with the patient or authorized representative who has  indicated his/her understanding and acceptance.     Dental advisory given  Plan Discussed with: CRNA  Anesthesia Plan Comments:        Anesthesia Quick Evaluation

## 2021-08-20 NOTE — Anesthesia Procedure Notes (Signed)
Procedure Name: LMA Insertion Date/Time: 08/20/2021 11:04 AM  Performed by: West Pugh, CRNAPre-anesthesia Checklist: Patient identified, Emergency Drugs available, Suction available, Patient being monitored and Timeout performed Patient Re-evaluated:Patient Re-evaluated prior to induction Oxygen Delivery Method: Circle system utilized Preoxygenation: Pre-oxygenation with 100% oxygen Induction Type: IV induction LMA: LMA with gastric port inserted LMA Size: 4.0 Number of attempts: 1 Placement Confirmation: positive ETCO2 Tube secured with: Tape Dental Injury: Teeth and Oropharynx as per pre-operative assessment

## 2021-08-20 NOTE — Anesthesia Procedure Notes (Signed)
Anesthesia Regional Block: Adductor canal block   Pre-Anesthetic Checklist: , timeout performed,  Correct Patient, Correct Site, Correct Laterality,  Correct Procedure, Correct Position, site marked,  Risks and benefits discussed,  Surgical consent,  Pre-op evaluation,  At surgeon's request and post-op pain management  Laterality: Right  Prep: chloraprep       Needles:  Injection technique: Single-shot  Needle Type: Stimiplex     Needle Length: 9cm  Needle Gauge: 21     Additional Needles:   Procedures:,,,, ultrasound used (permanent image in chart),,    Narrative:  Start time: 08/20/2021 9:02 AM End time: 08/20/2021 9:07 AM Injection made incrementally with aspirations every 5 mL.  Performed by: Personally  Anesthesiologist: Lynda Rainwater, MD

## 2021-08-20 NOTE — Transfer of Care (Signed)
Immediate Anesthesia Transfer of Care Note  Patient: Andre Cervantes  Procedure(s) Performed: PATELLA-FEMORAL ARTHROPLASTY (Right: Knee)  Patient Location: PACU  Anesthesia Type:GA combined with regional for post-op pain  Level of Consciousness: drowsy  Airway & Oxygen Therapy: Patient Spontanous Breathing and Patient connected to face mask oxygen  Post-op Assessment: Report given to RN and Post -op Vital signs reviewed and stable  Post vital signs: Reviewed and stable  Last Vitals:  Vitals Value Taken Time  BP 131/98 08/20/21 1305  Temp 36.5 C 08/20/21 1305  Pulse 73 08/20/21 1310  Resp 10 08/20/21 1310  SpO2 100 % 08/20/21 1310  Vitals shown include unvalidated device data.  Last Pain:  Vitals:   08/20/21 0906  TempSrc:   PainSc: 0-No pain         Complications: No notable events documented.

## 2021-08-20 NOTE — Interval H&P Note (Signed)
History and Physical Interval Note:  08/20/2021 7:44 AM  Andre Cervantes  has presented today for surgery, with the diagnosis of Right knee patellofemoral osteoarthritis.  The various methods of treatment have been discussed with the patient and family. After consideration of risks, benefits and other options for treatment, the patient has consented to  Procedure(s) with comments: PATELLA-FEMORAL ARTHROPLASTY (Right) - with adductor canal as a surgical intervention.  The patient's history has been reviewed, patient examined, no change in status, stable for surgery.  I have reviewed the patient's chart and labs.  Questions were answered to the patient's satisfaction.     Demitri Kucinski ANDREW

## 2021-08-20 NOTE — Op Note (Signed)
Preop diagnosis right knee end-stage patellofemoral osteoarthritis Postop diagnosis same Procedure right patellofemoral arthroplasty. Surgeon Hart Robinsons, MD Assistant Elenor Legato, PA-C Anesthesia General Complications none Blood loss minimal Drains none Tourniquet time 55 minutes 325 mmHg Disposition PACU stable  Operative details Patient was encountered in the holding area correct side marked and signed appropriately chart reviewed and signed.  All questions encouraged and answered.  Block administered per anesthesiology.  Take the operating room placed supine position.  Antibiotics were given within 1 hour 1 hour the surgical incision time.  Prepped with DuraPrep and draped into a sterile fashion.  After timeout exsanguinated with Esmarch tourniquet inflated 325 mmHg.  He had had a previous arthrotomy on the medial side and had done a previous medial patellofemoral ligament reconstruction.  The medial incision was utilized and carried proximal distal skin flaps were developed appropriate level make sure to make them appropriate thickness and not compromised blood supply.  As the MPFL reconstruction was intact on the medial side went for a lateral arthrotomy.  Meniscus was was a protected.  Patella was everted found to be end-stage arthritis bone against bone as well as the trochlea.  Patella was measured Pred Mild bone was resected locking holes were made for size 32.  We used the Biomet total femoral arthroplasty system.  A shim was placed there.  Knee was then flexed.  Starter hole was made and distal femur canal was irrigated and fluid was clear intramedullary rod was placed which shows a approximately 3 degrees flexion rotation based upon the medial femoral epicondyle lateral epicondyle epicondylar axis and Whitesides line the jig was cut to the appropriate height to make sure we did not notch the femur.  The anterior femur was then cut.  He was felt to be a size #3 router box was applied and  the distal femur was hand bird.  On this in cardiogram was done to supplement the previous.  Excess cartilage was removed circumferentially with a rondure.  We have the trial 3 insert and a 32 patella we had excellent tracking.  Trials were removed the knee was irrigated pulsatile lavage and antibiotic solution was placed.  Cement was mixed on the back table cemented to place a Biomet patellofemoral arthroplasty femoral component into the femur and then the patella size 32 and excess cement was removed.  After cement cured tracking was anatomic and very pleased.  The arthrotomy was meticulously closed with Vicryl followed by running locking suture.  Tourniquet was deflated hemostasis was obtained the skin had normal circulation of pink viability.  Subcu was closed with Vicryl skin was closed subcu microsuture Steri-Strips applied dressing.  And a compressive wrap.  Patient tolerated seizure well.  No complications or problems.  He will awaken to operating PACU in stable condition.  He will be stabilized in PACU discharged home later.  Patient surgery needed assistant from Ms. Leanne Hoss, PA-C for surgical assistance retraction protecting vascular structures and so forth.

## 2021-08-20 NOTE — Anesthesia Postprocedure Evaluation (Signed)
Anesthesia Post Note  Patient: Andre Cervantes  Procedure(s) Performed: PATELLA-FEMORAL ARTHROPLASTY (Right: Knee)     Patient location during evaluation: PACU Anesthesia Type: General Level of consciousness: awake and alert and oriented Pain management: pain level controlled Vital Signs Assessment: post-procedure vital signs reviewed and stable Respiratory status: spontaneous breathing, nonlabored ventilation and respiratory function stable Cardiovascular status: blood pressure returned to baseline and stable Postop Assessment: no apparent nausea or vomiting Anesthetic complications: no   No notable events documented.  Last Vitals:  Vitals:   08/20/21 1330 08/20/21 1345  BP: 138/86 (!) 144/90  Pulse: 81 77  Resp: 16 14  Temp:    SpO2: 99% 97%    Last Pain:  Vitals:   08/20/21 1330  TempSrc:   PainSc: 0-No pain                 Calianna Kim A.

## 2021-08-20 NOTE — H&P (Signed)
PATELLOFEMORAL KNEE ADMISSION H&P  Patient is being admitted for right patellofemoral knee arthroplasty.  Subjective:  Chief Complaint:right knee pain.  HPI: Andre Cervantes, 36 y.o. male, has a history of pain and functional disability in the right knee due to arthritis and has failed non-surgical conservative treatments for greater than 12 weeks to includeNSAID's and/or analgesics, corticosteriod injections, supervised PT with diminished ADL's post treatment, and activity modification.  Onset of symptoms was gradual, starting 4 years ago with gradually worsening course since that time. The patient noted prior procedures on the knee to include  arthroscopy and lateral release  on the right knee(s).  Patient currently rates pain in the right knee(s) at 7 out of 10 with activity. Patient has night pain, worsening of pain with activity and weight bearing, and pain with passive range of motion.  Patient has evidence of periarticular osteophytes and joint space narrowing by imaging studies. This patient has had  No previous injury . There is no active infection.  Patient Active Problem List   Diagnosis Date Noted   Palpitations 06/14/2020   ADD (attention deficit disorder)    Arthritis    GERD (gastroesophageal reflux disease)    High cholesterol    Hypertension    Pulmonary embolism ([place])    History of pulmonary embolus (PE) 05/23/2019   Osteoarthritis of left knee 02/24/2019   Hyponatremia 03/14/2018   DOE (dyspnea on exertion) 12/16/2017   Chest tightness 08/16/2017   History of pulmonary embolism 08/16/2017   Acute back pain with sciatica 07/05/2017   History of lumbar fusion 07/05/2017   Thrombocytosis 06/29/2017   Pain in left knee 05/19/2017   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   Dental infection 01/17/2017   Leukocytosis 01/17/2017   Fusion of lumbar spine    HCAP (healthcare-associated pneumonia)    Pneumonia    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 Overview:  Qualifier: Diagnosis of  By: Nils Pyle CMA Deborra Medina), Leisha   DVT (deep venous thrombosis) ([place]) 2013   GERD 11/07/2009   Qualifier: Diagnosis of  By: Nils Pyle CMA (AAMA), Riki Rusk of this note might be different from the original. Overview:  Qualifier: Diagnosis of  By: Nils Pyle CMA Deborra Medina), Leisha   History of lumbar fusion 07/05/2017   Hyperlipidemia 11/07/2009   Qualifier: Diagnosis of  By: Nils Pyle CMA (AAMA), Riki Rusk of this note might be different from the original. 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   Thrombocytosis 06/29/2017    Past Surgical History:  Procedure Laterality Date  BACK SURGERY  2017   plate and screws   cyst removed      Head dermatologist removed in office   FRACTURE SURGERY Right    hand age  25   KNEE ARTHROSCOPY WITH LATERAL RELEASE Right 01/05/2013   Procedure: KNEE ARTHROSCOPY WITH DEBRIDEMENT, LATERAL RELEASE, CARTICEL PROCEDURE, OPEN ALLOGRAFT;  Surgeon: Sydnee Cabal, MD;  Location: [facility];  Service: Orthopedics;  Laterality: Right;    KNEE SURGERY Bilateral 2013    knee   MEDIAL PATELLOFEMORAL LIGAMENT REPAIR Right 01/05/2013   Procedure: MEDIAL PATELLA FEMORAL LIGAMENT RECONSTRUCTION;  Surgeon: Sydnee Cabal, MD;  Location: [facility];  Service: Orthopedics;  Laterality: Right;   rt hand little finger pinning  childhood   TOTAL KNEE ARTHROPLASTY Left 02/24/2019   Procedure: Knee allograft MPFL reconstruction patellofemoral arthroplasty;  Surgeon: Sydnee Cabal, MD;  Location: [facility];  Service: Orthopedics;  Laterality: Left;  adductor canal    No current facility-administered medications for this encounter.   Current Outpatient Medications  Medication Sig Dispense Refill Last Dose   apixaban (ELIQUIS) 5 MG TABS tablet Take 5 mg by mouth 2 (two) times daily.      atenolol (TENORMIN) 50 MG tablet Take 50 mg by mouth daily.      atorvastatin (LIPITOR) 40 MG tablet Take 40 mg by mouth every morning.      benztropine (COGENTIN) 0.5 MG tablet Take 0.5 mg by mouth daily.      fenofibrate micronized (LOFIBRA) 134 MG capsule Take 134 mg by mouth at bedtime.      gabapentin (NEURONTIN) 100 MG capsule Take 100 mg by mouth every 6 (six) hours.      loratadine (CLARITIN) 10 MG tablet Take 10 mg by mouth every morning.      nitroGLYCERIN (NITROSTAT) 0.4 MG SL tablet Place 1 tablet (0.4 mg total) under the tongue every 5 (five) minutes as needed for chest pain. 25 tablet 11    omeprazole (PRILOSEC) 20 MG capsule Take 20 mg by mouth daily.      risperiDONE (RISPERDAL) 2 MG tablet Take 2 mg by mouth 2 (two) times daily.      tamsulosin (FLOMAX) 0.4 MG CAPS capsule Take 1 capsule (0.4 mg total) by mouth daily. (Patient taking differently: Take 0.4 mg by mouth in the morning and at bedtime.) 30 capsule 0    topiramate (TOPAMAX) 100 MG tablet Take 100 mg by mouth 2 (two) times daily.      Allergies  Allergen Reactions   Fentanyl Other (See Comments)    Patient developed severe chest wall rigidity  following IV dose of Fentanyl for post op pain management. Reaction occurred on first and second administration of Fentanyl intraop producing significant drop in SaO2 and increased airway pressures.   Morphine Swelling    Social History   Tobacco Use   Smoking status: Never   Smokeless tobacco: Former    Types: Chew    Quit date: 12/19/2016   Tobacco comments:    occ chew  Substance Use Topics   Alcohol use: Not Currently    Comment: occ beer    Family History  Problem Relation Age of Onset   Heart disease Father      Review of Systems  All other systems reviewed and are negative.   Objective:  Physical Exam Constitutional:      Appearance: Normal appearance. He is normal weight.  HENT:     Head: Normocephalic and atraumatic.  Cardiovascular:  Rate and Rhythm: Normal rate and regular rhythm.  Pulmonary:     Effort: Pulmonary effort is normal.     Breath sounds: Normal breath sounds.  Musculoskeletal:     Comments: On exam of their right knee, no skin changes or effusions noted. Patient has tenderness with palpation over the patellofemoral joint spaces. Full extension, full flexion. Crepitus under the patella with flexion and extension.No laxity of varus valgus pressure. 5 out of 5 strength with resisted knee flexion and extension. Calf is supple. Neurovascularly intact in their right lower extremity.  Skin:    General: Skin is warm and dry.     Capillary Refill: Capillary refill takes less than 2 seconds.  Neurological:     General: No focal deficit present.     Mental Status: He is alert and oriented to person, place, and time.  Psychiatric:        Mood and Affect: Mood normal.        Behavior: Behavior normal.        Thought Content: Thought content normal.        Judgment: Judgment normal.     Vital signs in last 24 hours:    Labs:   Estimated body mass index is 29.11 kg/m as calculated from the following:   Height as of 08/11/21: 5' 9.5" (1.765 m).    Weight as of 08/11/21: 90.7 kg.   Imaging Review Plain radiographs demonstrate severe degenerative joint disease of the right patellofemoral joint. The overall alignment isneutral. The bone quality appears to be good for age and reported activity level.      Assessment/Plan:  End stage arthritis, right knee patellofemoral joint   The patient history, physical examination, clinical judgment of the provider and imaging studies are consistent with end stage degenerative joint disease of the right knee patellofemoral joint and patellofemoral arthroplasty is deemed medically necessary. The treatment options including medical management, injection therapy arthroscopy and arthroplasty were discussed at length. The risks and benefits of partial knee arthroplasty were presented and reviewed. The risks due to aseptic loosening, infection, stiffness, patella tracking problems, thromboembolic complications and other imponderables were discussed. The patient acknowledged the explanation, agreed to proceed with the plan and consent was signed. Patient is being admitted for inpatient treatment for surgery, pain control, PT, OT, prophylactic antibiotics, VTE prophylaxis, progressive ambulation and ADL's and discharge planning. The patient is planning to be discharged  home with outpatient PT

## 2021-08-21 ENCOUNTER — Encounter (HOSPITAL_COMMUNITY): Payer: Self-pay | Admitting: Specialist

## 2021-09-08 ENCOUNTER — Other Ambulatory Visit: Payer: Self-pay | Admitting: Cardiology

## 2021-09-09 ENCOUNTER — Telehealth: Payer: Self-pay | Admitting: *Deleted

## 2021-09-09 NOTE — Telephone Encounter (Signed)
Spoke with pt's father about a refill request we received from pharmacy for metoprolol. It says in chart this med was d/c. Also he was told to follow up as needed. Dad said pt is doing fine and does not need the metoprolol so it will be refused and d/c in chart.

## 2021-12-24 ENCOUNTER — Institutional Professional Consult (permissible substitution): Payer: Medicare Other | Admitting: Pulmonary Disease

## 2022-02-05 ENCOUNTER — Ambulatory Visit (INDEPENDENT_AMBULATORY_CARE_PROVIDER_SITE_OTHER): Payer: Medicare HMO | Admitting: Pulmonary Disease

## 2022-02-05 ENCOUNTER — Encounter: Payer: Self-pay | Admitting: Pulmonary Disease

## 2022-02-05 VITALS — BP 132/82 | HR 84 | Ht 69.5 in | Wt 211.0 lb

## 2022-02-05 DIAGNOSIS — R0683 Snoring: Secondary | ICD-10-CM

## 2022-02-05 NOTE — Patient Instructions (Signed)
Schedule for split-night study  Behavioral modifications to help you get better sleep will include not eating too late into the night Not too much liquids close to bedtime  Set bedtime and wake up time to try and get about 6 to 8 hours of sleep  Regular exercises as tolerated  Follow-up in 3 months

## 2022-02-05 NOTE — Progress Notes (Signed)
Andre Cervantes    [id number]    [birth year]  Primary Care Physician:Jordan, Nyoka Cowden, MD  Referring Physician: Earna Coder, NP [address] [place],   [address]  Chief complaint:   Concern for sleep apnea  HPI:  Snoring, witnessed apnea Daytime sleepiness Unable to sleep through the night  Usually goes to bed at different times of the evening Falls asleep easily Multiple awakenings Wake up time also very variable  Some dryness of his mouth in the morning No morning headaches  Does take medications that contribute to sleepiness during the day  Reformed smoker quit in 2019 was smoking about half a pack a day  Dad does have sleep apnea  Outpatient Encounter Medications as of 02/05/2022  Medication Sig   apixaban (ELIQUIS) 5 MG TABS tablet Take 5 mg by mouth 2 (two) times daily.   atenolol (TENORMIN) 50 MG tablet Take 50 mg by mouth daily.   atorvastatin (LIPITOR) 40 MG tablet Take 40 mg by mouth every morning.   benztropine (COGENTIN) 0.5 MG tablet Take 0.5 mg by mouth daily.   fenofibrate micronized (LOFIBRA) 134 MG capsule Take 134 mg by mouth at bedtime.   gabapentin (NEURONTIN) 100 MG capsule Take 300 mg by mouth.   loratadine (CLARITIN) 10 MG tablet Take 10 mg by mouth every morning.   nitroGLYCERIN (NITROSTAT) 0.4 MG SL tablet Place 1 tablet (0.4 mg total) under the tongue every 5 (five) minutes as needed for chest pain.   omeprazole (PRILOSEC) 20 MG capsule Take 20 mg by mouth daily.   risperiDONE (RISPERDAL) 2 MG tablet Take 2 mg by mouth 2 (two) times daily.   tamsulosin (FLOMAX) 0.4 MG CAPS capsule Take 1 capsule (0.4 mg total) by mouth daily. (Patient taking differently: Take 0.4 mg by mouth in the morning and at bedtime.)   topiramate (TOPAMAX) 100 MG tablet Take 100 mg by mouth 2 (two) times daily.   methocarbamol (ROBAXIN) 500 MG tablet Take 1 tablet (500 mg total) by mouth 4 (four) times daily. (Patient not taking: Reported on  02/05/2022)   ondansetron (ZOFRAN) 4 MG tablet Take 1 tablet (4 mg total) by mouth daily as needed for nausea or vomiting. (Patient not taking: Reported on 02/05/2022)   oxyCODONE (ROXICODONE) 5 MG immediate release tablet Take 1-2 tablets (5-10 mg total) by mouth every 4 (four) hours as needed for severe pain. (Patient not taking: Reported on 02/05/2022)   traMADol (ULTRAM) 50 MG tablet Take 1 tablet (50 mg total) by mouth every 6 (six) hours as needed for moderate pain. (Patient not taking: Reported on 02/05/2022)   No facility-administered encounter medications on file as of 02/05/2022.    Allergies as of 02/05/2022 - Review Complete 02/05/2022  Allergen Reaction Noted   Fentanyl Other (See Comments) 01/05/2013   Morphine Swelling     Past Medical History:  Diagnosis Date   ADD (attention deficit disorder)    Arthritis    Chest tightness 08/16/2017   Depression 11/07/2009   Qualifier: Diagnosis of  By: Nils Pyle CMA (AAMA), Riki Rusk of this note might be different from the original. Overview:  Qualifier: Diagnosis of  By: Nils Pyle CMA Deborra Medina), Leisha   DVT (deep venous thrombosis) ([place]) 2013   GERD 11/07/2009   Qualifier: Diagnosis of  By: Nils Pyle CMA (AAMA), Riki Rusk of this note might be different from the original. Overview:  Qualifier: Diagnosis of  By: Nils Pyle  CMA (AAMA), Leisha   History of lumbar fusion 07/05/2017   Hyperlipidemia 11/07/2009   Qualifier: Diagnosis of  By: Nils Pyle CMA (AAMA), Riki Rusk of this note might be different from the original. 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   Thrombocytosis 06/29/2017    Past Surgical History:  Procedure  Laterality Date   BACK SURGERY  2017   plate and screws   cyst removed      Head dermatologist removed in office   FRACTURE SURGERY Right    hand age  67   KNEE ARTHROSCOPY WITH LATERAL RELEASE Right 01/05/2013   Procedure: KNEE ARTHROSCOPY WITH DEBRIDEMENT, LATERAL RELEASE, CARTICEL PROCEDURE, OPEN ALLOGRAFT;  Surgeon: Sydnee Cabal, MD;  Location: [facility];  Service: Orthopedics;  Laterality: Right;   KNEE SURGERY Bilateral 2013    knee   MEDIAL PATELLOFEMORAL LIGAMENT REPAIR Right 01/05/2013   Procedure: MEDIAL PATELLA FEMORAL LIGAMENT RECONSTRUCTION;  Surgeon: Sydnee Cabal, MD;  Location: [facility];  Service: Orthopedics;  Laterality: Right;   PATELLA-FEMORAL ARTHROPLASTY Right 08/20/2021   Procedure: PATELLA-FEMORAL ARTHROPLASTY;  Surgeon: Sydnee Cabal, MD;  Location: [facility];  Service: Orthopedics;  Laterality: Right;  with adductor canal   rt hand little finger pinning  childhood   TOTAL KNEE ARTHROPLASTY Left 02/24/2019   Procedure: Knee allograft MPFL reconstruction patellofemoral arthroplasty;  Surgeon: Sydnee Cabal, MD;  Location: [facility];  Service: Orthopedics;  Laterality: Left;  adductor canal    Family History  Problem Relation Age of Onset   Heart disease Father     Social History   Socioeconomic History   Marital status: Single    Spouse name: Not on file   Number of children: Not on file   Years of education: Not on file   Highest education level: Not on file  Occupational History   Not on file  Tobacco Use   Smoking status: Never   Smokeless tobacco: Former    Types: Chew    Quit date: 12/19/2016   Tobacco comments:    occ chew  Vaping Use   Vaping Use: Never used  Substance and Sexual Activity   Alcohol use: Not Currently    Comment: occ beer   Drug use: Yes    Types: Other-see comments    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 Negative for shortness of breath.   Psychiatric/Behavioral:  Positive for sleep disturbance.     Vitals:   02/05/22 1452  BP: 132/82  Pulse: 84  SpO2: 98%     Physical Exam Constitutional:      Appearance: He is obese.  HENT:     Head: Normocephalic.     Mouth/Throat:     Mouth: Mucous membranes are moist.  Eyes:     General: No scleral icterus. Cardiovascular:     Rate and Rhythm: Normal rate.     Heart sounds: No murmur heard.    No friction rub.  Pulmonary:     Effort: No respiratory distress.     Breath sounds: No stridor. No wheezing or rhonchi.  Musculoskeletal:     Cervical back: No rigidity or tenderness.  Neurological:     Mental Status: He is alert.  Psychiatric:        Mood and Affect: Mood normal.       02/05/2022    2:00 PM  Results of the Epworth flowsheet  Sitting and reading 3  Watching TV 3  Sitting, inactive in a public place (e.g. a theatre or a meeting) 0  As a passenger in a car for an hour without a break 0  Lying down to rest in the afternoon when circumstances permit 3  Sitting and talking to someone 0  Sitting quietly after a lunch without alcohol 3  In a car, while stopped for a few minutes in traffic 0  Total score 12     Data Reviewed: No previous sleep study available  Assessment:  Excessive daytime sleepiness  Witnessed apneas and snoring  History of pulmonary embolism in the past  Hypertension  Pathophysiology of sleep disordered breathing discussed with the patient  Treatment options for sleep disordered breathing  discussed with the patient  Plan/Recommendations: Risk with not treating sleep disordered breathing discussed with the patient  Behavioral modifications that may help sleep quality discussed with the patient including not eating too late into the evening, decrease in fluid intake close to bedtime  Regular exercises for weight loss  Will schedule patient for a split-night study  Follow-up appointment in about 3 months  Encouraged to call with any significant concerns    Sherrilyn Rist MD [facility] 02/05/2022, 3:25 PM  CC: Earna Coder, NP

## 2022-04-28 ENCOUNTER — Ambulatory Visit (HOSPITAL_BASED_OUTPATIENT_CLINIC_OR_DEPARTMENT_OTHER): Payer: Medicare HMO | Attending: Pulmonary Disease | Admitting: Pulmonary Disease

## 2022-04-28 DIAGNOSIS — R0683 Snoring: Secondary | ICD-10-CM | POA: Insufficient documentation

## 2022-04-28 DIAGNOSIS — I493 Ventricular premature depolarization: Secondary | ICD-10-CM | POA: Diagnosis not present

## 2022-04-28 DIAGNOSIS — G4736 Sleep related hypoventilation in conditions classified elsewhere: Secondary | ICD-10-CM | POA: Diagnosis not present

## 2022-05-18 ENCOUNTER — Telehealth: Payer: Self-pay | Admitting: Pulmonary Disease

## 2022-05-18 DIAGNOSIS — R0683 Snoring: Secondary | ICD-10-CM

## 2022-05-18 NOTE — Telephone Encounter (Signed)
Call patient  Sleep study result  Date of study: 04/28/2022  Impression: Negative for significant sleep disordered breathing Mild oxygen desaturations  Recommendation: Encourage aggressive weight loss measures  Behavioral modifications to decrease snoring including weight loss measures, avoiding supine sleep, elevation of the head of the bed  An oral device may be considered for the management of snoring  Optimize sleep hygiene  Encouraged to get 6 to 8 hours of sleep every night  Follow-up as previously scheduled

## 2022-05-18 NOTE — Procedures (Signed)
POLYSOMNOGRAPHY  Last, First: Andre, Cervantes MRN: [id number] Gender: Male Age (years): 36 Weight (lbs): 207 DOB: [birth year] BMI: 30 Primary Care: No PCP Epworth Score: 8 Referring: Tomma Lightning MD Technician: Ulyess Mort Interpreting: Tomma Lightning MD Study Type: NPSG Ordered Study Type: Split Night CPAP Study date: 04/28/2022 Location: [facility] CLINICAL INFORMATION Andre Cervantes is a 37 year old Male and was referred to the sleep center for evaluation of G47.33 OSA: Adult and Pediatric (327.23). Indications include Hypertension, Obesity, Snoring.  MEDICATIONS Patient self administered medications include: N/A. Medications administered during study include No sleep medicine administered.  SLEEP STUDY TECHNIQUE A multi-channel overnight Polysomnography study was performed. The channels recorded and monitored were central and occipital EEG, electrooculogram (EOG), submentalis EMG (chin), nasal and oral airflow, thoracic and abdominal wall motion, anterior tibialis EMG, snore microphone, electrocardiogram, and a pulse oximetry. TECHNICIAN COMMENTS Comments added by Technician: PATIENT WAS ORDERED AS A SPLIT NIGHT STUDY Comments added by Scorer: N/A SLEEP ARCHITECTURE The study was initiated at 9:47:36 PM and terminated at 4:39:48 AM. The total recorded time was 412.2 minutes. EEG confirmed total sleep time was 355 minutes yielding a sleep efficiency of 86.1%. Sleep onset after lights out was 1.9 minutes with a REM latency of 224.5 minutes. The patient spent 4.5% of the night in stage N1 sleep, 93.8% in stage N2 sleep, 0.0% in stage N3 and 1.7% in REM. Wake after sleep onset (WASO) was 55.3 minutes. The Arousal Index was 3.4/hour. RESPIRATORY PARAMETERS There were a total of 11 respiratory disturbances out of which 7 were apneas ( 0 obstructive, 0 mixed, 7 central) and 4 hypopneas. The apnea/hypopnea index (AHI) was 1.9 events/hour. The central sleep apnea index was 1.2  events/hour. The REM AHI was 30.0 events/hour and NREM AHI was 1.4 events/hour. The supine AHI was 0.0 events/hour and the non supine AHI was 5.7 supine during 67.18% of sleep. Respiratory disturbances were associated with oxygen desaturation down to a nadir of 82.0% during sleep. The mean oxygen saturation during the study was 93.1%. .  LEG MOVEMENT DATA The total leg movements were 0 with a resulting leg movement index of 0.0/hr .Associated arousal with leg movement index was 0.0/hr.  CARDIAC DATA The underlying cardiac rhythm was most consistent with sinus rhythm. Mean heart rate during sleep was 66.6 bpm. Additional rhythm abnormalities include PVCs.  IMPRESSIONS - No Significant Obstructive Sleep apnea(OSA) - Electrocardiographic data showed presence of PVCs. - Mild Oxygen Desaturation - The patient snored with moderate snoring volume. - No significant periodic leg movements(PLMs) during sleep. However, no significant associated arousals.  DIAGNOSIS - Mild Nocturnal Hypoxemia (G47.36)  RECOMMENDATIONS - Avoid alcohol, sedatives and other CNS depressants that may worsen sleep apnea and disrupt normal sleep architecture. - Sleep hygiene should be reviewed to assess factors that may improve sleep quality. - Weight management and regular exercise should be initiated or continued.  [Electronically signed] 05/18/2022 05:44 AM  Virl Diamond MD NPI: [id number]

## 2022-05-26 ENCOUNTER — Ambulatory Visit: Payer: Medicaid Other | Admitting: Pulmonary Disease

## 2022-05-29 NOTE — Telephone Encounter (Signed)
Spoke with patient regarding sleep per Dr.Olaere  Call patient   Sleep study result   Date of study: 04/28/2022   Impression: Negative for significant sleep disordered breathing Mild oxygen desaturations   Recommendation: Encourage aggressive weight loss measures   Behavioral modifications to decrease snoring including weight loss measures, avoiding supine sleep, elevation of the head of the bed   An oral device may be considered for the management of snoring   Optimize sleep hygiene   Encouraged to get 6 to 8 hours of sleep every night   Follow-up as previously scheduled   Patient's voice was understanding. Patient does have a f/u and patient's voice was understanding.  Nothing else further needed.

## 2022-06-09 ENCOUNTER — Ambulatory Visit: Payer: Medicare HMO | Admitting: Pulmonary Disease

## 2022-06-23 ENCOUNTER — Telehealth: Payer: Self-pay | Admitting: Cardiology

## 2022-06-23 DIAGNOSIS — R0789 Other chest pain: Secondary | ICD-10-CM

## 2022-06-23 DIAGNOSIS — E871 Hypo-osmolality and hyponatremia: Secondary | ICD-10-CM

## 2022-06-23 DIAGNOSIS — Z86711 Personal history of pulmonary embolism: Secondary | ICD-10-CM

## 2022-06-23 MED ORDER — NITROGLYCERIN 0.4 MG SL SUBL: 0.4000 mg | SUBLINGUAL_TABLET | SUBLINGUAL | 11 refills | Status: DC | PRN

## 2022-06-23 NOTE — Telephone Encounter (Signed)
Pt c/o of Chest Pain: STAT if CP now or developed within 24 hours  1. Are you having CP right now?   No  2. Are you experiencing any other symptoms (ex. SOB, nausea, vomiting, sweating)?   No  3. How long have you been experiencing CP?   Since yesterday  4. Is your CP continuous or coming and going?   Comes and goes  5. Have you taken Nitroglycerin?   No  Patient stated he had chest pains yesterday.  Patient stated he was working outside the other day and had to take nitroglycerin.  Patient stated his BP has been low.  BP this morning 118/76  HR 71. ?

## 2022-06-23 NOTE — Telephone Encounter (Signed)
Denies chest pain today and states the pain he had was pinching. C/O being tired but states that he got up to early. NTG has been sent to pharmecy as his had expired.

## 2022-08-04 ENCOUNTER — Ambulatory Visit: Payer: Medicare HMO | Admitting: Diagnostic Neuroimaging

## 2022-08-28 ENCOUNTER — Telehealth: Payer: Self-pay | Admitting: Cardiology

## 2022-08-28 ENCOUNTER — Telehealth: Payer: Self-pay

## 2022-08-28 NOTE — Telephone Encounter (Signed)
Patient call was transferred from the patient engagement center to me. Patient reported that he had chest pressure all over his chest this past morning that lasted a few minutes. His blood pressure this morning was 113/81 HR 70. Patient also has been more SOB than usual. Currently he has no chest pain, pressure or discomfort and is only a little SOB. Please advise.

## 2022-08-28 NOTE — Telephone Encounter (Signed)
Called the patient and he reported that he had another episode of chest pressure and had to take 1 nitroglycerin for the chest pressure to go away. Spoke to Dr. Bing Matter and he recommended that the patient should go to the ER to be evaluated. I relayed this information to the patient and he stated that he would go to the ER. Patient had no further questions at this time.

## 2022-08-28 NOTE — Telephone Encounter (Signed)
Pt c/o of Chest Pain: STAT if CP now or developed within 24 hours  1. Are you having CP right now?  No   2. Are you experiencing any other symptoms (ex. SOB, nausea, vomiting, sweating)?  SOB  3. How long have you been experiencing CP?  Started this morning  4. Is your CP continuous or coming and going?  Coming and going   5. Have you taken Nitroglycerin?  No ?

## 2022-09-16 NOTE — Progress Notes (Signed)
Cardiology Office Note:  .   Date:  09/17/2022  ID:  Andre Cervantes, DOB Jul 08, 1985, MRN [id number] PCP: [place], Sarah T, MD  [facility] Providers Cardiologist:  Norman Herrlich, MD    History of Present Illness: .   Andre Cervantes is a 37 y.o. male with a past medical history of pulmonary embolism, DVT, hypertension, GERD, hyperlipidemia, tobacco abuse, palpitations, anxiety, depression.  01/03/2021 monitor average heart rate 71 bpm, predominant rhythm sinus, Mobitz type I was present, all triggers were associated with sinus rhythm. 02/08/2018 coronary CTA calcium score of 0 10/13/2018 Lexiscan low risk study, 1 defect of mild severity in the basal inferior location 09/06/2017 stress echo normal, EF 55 to 60%, mild MR  Most recently evaluated by Dr. Dulce Sellar on 02/03/2021 for evaluation of dizziness, his blood pressure was low, his tamsulosin dose was decreased.  Evaluated in ED [facility] on 06/28/2022 for chest pain. Troponins negative x 2, EKG without changes, D-dimer was negative, chest x-ray normal.  He presents today accompanied by his brother for evaluation of atypical chest pain as outlined above. He has not had further episodes of chest pain but was worried about his ED visit. His father recently passed away, he is living with his brother who provides much of his history. He is remaining very sedentary, started drinking ~ 2 L of mountain dew per day and started using smokeless tobacco. His brother feels he is too sedentary and needs to increase his physical activity. He denies chest pain, palpitations, dyspnea, pnd, orthopnea, n, v, dizziness, syncope, edema, weight gain, or early satiety.   ROS: Review of Systems  Musculoskeletal:  Positive for neck pain.  Psychiatric/Behavioral:  The patient is nervous/anxious.   All other systems reviewed and are negative.    Studies Reviewed: .      Cardiac Studies & Procedures     STRESS TESTS  MYOCARDIAL PERFUSION IMAGING  10/13/2018  Narrative  The left ventricular ejection fraction is hyperdynamic (>65%).  Nuclear stress EF: 68%.  There was no ST segment deviation noted during stress.  Defect 1: There is a small defect of mild severity present in the basal inferior location.  The study is normal.  This is a low risk study.   ECHOCARDIOGRAM  ECHOCARDIOGRAM COMPLETE 01/17/2017  Narrative *[place]* *[facility]* [address] [place], [place] [address] [phone]  ------------------------------------------------------------------- Transthoracic Echocardiography  Patient:    Andre, Cervantes MR #:       [id number] Study Date: 01/17/2017 Gender:     M Age:        31 Height:     175.3 cm Weight:     81.5 kg BSA:        2.01 m^2 Pt. Status: Room:       3E15C  SONOGRAPHER  Delcie Roch, RDCS, CCT ATTENDING    Ward, Layla Maw PERFORMING   [facility], Inpatient ADMITTING    Katrinka Blazing, Rondell A ORDERING     Smith, Rondell A  cc:  ------------------------------------------------------------------- LV EF: 55% -   60%  ------------------------------------------------------------------- Indications:      Pulmonary embolus 415.19.  ------------------------------------------------------------------- Study Conclusions  - Left ventricle: The cavity size was normal. Wall thickness was normal. Systolic function was normal. The estimated ejection fraction was in the range of 55% to 60%. Wall motion was normal; there were no regional wall motion abnormalities. - Mitral valve: There was mild regurgitation. - Right ventricle: The cavity size was normal. Wall thickness was normal.  Systolic function was normal. - Tricuspid valve: There was mild regurgitation.  ------------------------------------------------------------------- Study data:  Comparison was made to the study of 10/25/2011.  Study status:  Routine.  Procedure:  The patient reported no pain pre or post test.  Transthoracic echocardiography. Image quality was fair. Study completion:  There were no complications. Transthoracic echocardiography.  M-mode, complete 2D, spectral Doppler, and color Doppler.  Birthdate:  Patient birthdate: October 13, 1985.  Age:  Patient is 37 yr old.  Sex:  Gender: male. BMI: 26.5 kg/m^2.  Blood pressure:     105/59  Patient status: Inpatient.  Study date:  Study date: 01/17/2017. Study time: 11:16 AM.  Location:  Bedside.  -------------------------------------------------------------------  ------------------------------------------------------------------- Left ventricle:  The cavity size was normal. Wall thickness was normal. Systolic function was normal. The estimated ejection fraction was in the range of 55% to 60%. Wall motion was normal; there were no regional wall motion abnormalities. There was no evidence of elevated ventricular filling pressure by Doppler parameters.  ------------------------------------------------------------------- Aortic valve:   Trileaflet; normal thickness leaflets. Mobility was not restricted.  Doppler:  Transvalvular velocity was within the normal range. There was no stenosis. There was no regurgitation.  ------------------------------------------------------------------- Aorta:  Aortic root: The aortic root was normal in size.  ------------------------------------------------------------------- Mitral valve:   Structurally normal valve.   Mobility was not restricted.  Doppler:  Transvalvular velocity was within the normal range. There was no evidence for stenosis. There was mild regurgitation.    Peak gradient (D): 4 mm Hg.  ------------------------------------------------------------------- Left atrium:  The atrium was normal in size.  ------------------------------------------------------------------- Right ventricle:  The cavity size was normal. Wall thickness was normal. Systolic function was  normal.  ------------------------------------------------------------------- Pulmonic valve:   Poorly visualized.  Structurally normal valve. Cusp separation was normal.  Doppler:  Transvalvular velocity was within the normal range. There was no evidence for stenosis. There was no regurgitation.  ------------------------------------------------------------------- Tricuspid valve:   Structurally normal valve.    Doppler: Transvalvular velocity was within the normal range. There was mild regurgitation.  ------------------------------------------------------------------- Pulmonary artery:   The main pulmonary artery was normal-sized. Systolic pressure was within the normal range.  ------------------------------------------------------------------- Right atrium:  The atrium was normal in size.  ------------------------------------------------------------------- Pericardium:  There was no pericardial effusion.  ------------------------------------------------------------------- Systemic veins: Inferior vena cava: The vessel was normal in size.  ------------------------------------------------------------------- Measurements  Left ventricle                         Value        Reference LV ID, ED, PLAX chordal                43.8  mm     43 - 52 LV ID, ES, PLAX chordal                27.3  mm     23 - 38 LV fx shortening, PLAX chordal         38    %      >=29 LV PW thickness, ED                    7.8   mm     ---------- IVS/LV PW ratio, ED                    1.06         <=1.3 Stroke volume, 2D  74    ml     ---------- Stroke volume/bsa, 2D                  37    ml/m^2 ---------- LV e&', lateral                         15.3  cm/s   ---------- LV E/e&', lateral                       6.33         ---------- LV e&', medial                          7.29  cm/s   ---------- LV E/e&', medial                        13.28        ---------- LV e&', average                          11.3  cm/s   ---------- LV E/e&', average                       8.57         ----------  Ventricular septum                     Value        Reference IVS thickness, ED                      8.29  mm     ----------  LVOT                                   Value        Reference LVOT ID, S                             19    mm     ---------- LVOT area                              2.84  cm^2   ---------- LVOT peak velocity, S                  130   cm/s   ---------- LVOT mean velocity, S                  79.2  cm/s   ---------- LVOT VTI, S                            26.1  cm     ---------- LVOT peak gradient, S                  7     mm Hg  ----------  Aorta                                  Value        Reference Aortic root ID, ED  28    mm     ----------  Left atrium                            Value        Reference LA ID, A-P, ES                         38    mm     ---------- LA ID/bsa, A-P                         1.9   cm/m^2 <=2.2 LA volume, S                           47.1  ml     ---------- LA volume/bsa, S                       23.5  ml/m^2 ---------- LA volume, ES, 1-p A4C                 41.8  ml     ---------- LA volume/bsa, ES, 1-p A4C             20.8  ml/m^2 ---------- LA volume, ES, 1-p A2C                 50.6  ml     ---------- LA volume/bsa, ES, 1-p A2C             25.2  ml/m^2 ----------  Mitral valve                           Value        Reference Mitral E-wave peak velocity            96.8  cm/s   ---------- Mitral A-wave peak velocity            75    cm/s   ---------- Mitral deceleration time               197   ms     150 - 230 Mitral peak gradient, D                4     mm Hg  ---------- Mitral E/A ratio, peak                 1.3          ----------  Pulmonary arteries                     Value        Reference PA pressure, S, DP                     28    mm Hg  <=30  Tricuspid valve                        Value         Reference Tricuspid regurg peak velocity         252   cm/s   ---------- Tricuspid peak RV-RA gradient          25    mm Hg  ----------  Right atrium  Value        Reference RA ID, S-I, ES, A4C                    47    mm     34 - 49 RA area, ES, A4C                       11.4  cm^2   8.3 - 19.5 RA volume, ES, A/L                     23.2  ml     ---------- RA volume/bsa, ES, A/L                 11.6  ml/m^2 ----------  Systemic veins                         Value        Reference Estimated CVP                          3     mm Hg  ----------  Right ventricle                        Value        Reference TAPSE                                  26    mm     ---------- RV pressure, S, DP                     28    mm Hg  <=30 RV s&', lateral, S                      15.3  cm/s   ----------  Legend: (L)  and  (H)  mark values outside specified reference range.  ------------------------------------------------------------------- Prepared and Electronically Authenticated by  Donato Schultz, M.D. 2018-12-23T12:53:05    MONITORS  LONG TERM MONITOR (3-14 DAYS) 12/30/2020  Narrative Patch Wear Time:  6 days and 20 hours (2022-11-16T09:10:14-0500 to 2022-11-23T06:00:19-0500)  Patient had a min HR of 39 bpm, max HR of 118 bpm, and avg HR of 71 bpm. Predominant underlying rhythm was Sinus Rhythm. Second Degree AV Block-Mobitz I (Wenckebach) was present. Isolated SVEs were rare (<1.0%), SVE Couplets were rare (<1.0%), and no SVE Triplets were present. Isolated VEs were rare (<1.0%), and no VE Couplets or VE Triplets were present.  Symptomatic events all sinus rhythm Both ventricular and supraventricular ectopy rare There are rare episodes of Mobitz 1 second-degree heart block without significant pauses or symptoms  Conclusion unremarkable event monitor.   CT SCANS  CT CORONARY MORPH W/CTA COR W/SCORE 03/01/2018  Addendum 03/01/2018  4:36 PM ADDENDUM REPORT: 03/01/2018  16:34  CLINICAL DATA:  37 year old male with hypertension, hyperlipidemia and typical chest pain.  EXAM: Cardiac/Coronary  CT  TECHNIQUE: The patient was scanned on a [employer].  FINDINGS: A 120 kV prospective scan was triggered in the descending thoracic aorta at 111 HU's. Axial non-contrast 3 mm slices were carried out through the heart. The data set was analyzed on a dedicated work station and scored using the Agatson method. Gantry rotation speed was 250 msecs and collimation  was .6 mm. No beta blockade and 0.8 mg of sl NTG was given. The 3D data set was reconstructed in 5% intervals of the 67-82 % of the R-R cycle. Diastolic phases were analyzed on a dedicated work station using MPR, MIP and VRT modes. The patient received 80 cc of contrast.  Aorta:  Normal size.  No calcifications.  No dissection.  Aortic Valve:  Trileaflet.  No calcifications.  Coronary Arteries:  Normal coronary origin.  Right dominance.  RCA is a large dominant artery that gives rise to PDA and PLVB. There is no plaque.  Left main is a large artery that gives rise to LAD and LCX arteries.  LAD is a large vessel that has gives rise to two diagonal arteries and has no plaque.  LCX is a non-dominant artery that gives rise to one large OM1 branch. There is no plaque.  Other findings:  Normal pulmonary vein drainage into the left atrium.  Normal let atrial appendage without a thrombus.  Normal size of the pulmonary artery.  IMPRESSION: 1. Coronary calcium score of 0. This was 0 percentile for age and sex matched control.  2. Normal coronary origin with right dominance.  3. No evidence of CAD.  Consider non-cardiac causes of chest pain.   Electronically Signed By: Tobias Alexander On: 03/01/2018 16:34  Addendum 03/01/2018  3:29 PM ADDENDUM REPORT: 03/01/2018 15:27  CLINICAL DATA:  Chest pain  EXAM: Cardiac CTA  MEDICATIONS: Sub lingual nitro. 4 mg and lopressor  5mg   TECHNIQUE: The patient was scanned on a [employer] 192 scanner. Gantry rotation speed was 250 msecs. Collimation was. 6 mm . A 120 kV prospective scan was triggered in the ascending thoracic aorta at 140 HU's with full mA between 30-70% of the R-R interval . Average HR during the scan was 61 bpm. The 3D data set was interpreted on a dedicated work station using MPR, MIP and VRT modes. A total of 80 cc of contrast was used.  FINDINGS: Non-cardiac: See separate report from [facility]. No significant findings on limited lung and soft tissue windows.  Calcium score: No calcium noted  Coronary Arteries: Right dominant with no anomalies  LM: Normal  LAD: Normal  D1: Normal  D2: Normal  Circumflex: Normal  OM1: Normal  OM2: Normal  RCA: Normal  PDA: Normal  PLA: Normal  IMPRESSION: 1.  Calcium score 0  2.  Normal right dominant coronary arteries with no anomaly  3.  Normal aortic root 2.7 cm  Charlton Haws   Electronically Signed By: Charlton Haws M.D. On: 03/01/2018 15:27  Narrative EXAM: OVER-READ INTERPRETATION  CT CHEST  The following report is an over-read performed by radiologist Dr. Charlett Nose of [facility] on 03/01/2018. This over-read does not include interpretation of cardiac or coronary anatomy or pathology. The coronary CTA interpretation by the cardiologist is attached.  COMPARISON:  03/17/2017  FINDINGS: Vascular: Heart is normal size.  Aorta is normal caliber.  Mediastinum/Nodes: No adenopathy in the lower mediastinum or hila.  Lungs/Pleura: No confluent opacities or effusions.  Upper Abdomen: Imaging into the upper abdomen shows no acute findings.  Musculoskeletal: Chest wall soft tissues are unremarkable. No acute bony abnormality.  IMPRESSION: No acute or significant extracardiac abnormality.  Electronically Signed: By: Charlett Nose M.D. On: 03/01/2018 14:51                    Physical  Exam:   VS:  BP 112/68 (BP Location:  Right Arm, Patient Position: Sitting, Cuff Size: Normal)   Pulse 85   Ht 5\' 9"  (1.753 m)   Wt 208 lb 6.4 oz (94.5 kg)   SpO2 98%   BMI 30.78 kg/m    Wt Readings from Last 3 Encounters:  09/17/22 208 lb 6.4 oz (94.5 kg)  04/28/22 207 lb (93.9 kg)  02/05/22 211 lb (95.7 kg)    GEN: Well nourished, well developed in no acute distress NECK: No JVD; No carotid bruits CARDIAC: RRR, no murmurs, rubs, gallops RESPIRATORY:  Clear to auscultation without rales, wheezing or rhonchi  ABDOMEN: Soft, non-tender, non-distended EXTREMITIES:  No edema; No deformity   ASSESSMENT AND PLAN: .   Atypical chest pain - calcium score of 0, atypical chest pain, and troponins were negative.  HTN - BP is well controlled at 112/68, currently not on any antihypertensive agents. History of DVT/PE - chronic anticoagulation with Eliquis, managed by his PCP.  Mixed dyslipidemia - managed by PCP, continue lofibra, atorvastatin         Dispo: Return in 6 months.   Signed, Flossie Dibble, NP

## 2022-09-17 ENCOUNTER — Ambulatory Visit: Payer: Medicare HMO | Attending: Cardiology | Admitting: Cardiology

## 2022-09-17 ENCOUNTER — Encounter: Payer: Self-pay | Admitting: Cardiology

## 2022-09-17 VITALS — BP 112/68 | HR 85 | Ht 69.0 in | Wt 208.4 lb

## 2022-09-17 DIAGNOSIS — I441 Atrioventricular block, second degree: Secondary | ICD-10-CM

## 2022-09-17 DIAGNOSIS — R42 Dizziness and giddiness: Secondary | ICD-10-CM

## 2022-09-17 DIAGNOSIS — R0789 Other chest pain: Secondary | ICD-10-CM | POA: Diagnosis not present

## 2022-09-17 DIAGNOSIS — I10 Essential (primary) hypertension: Secondary | ICD-10-CM | POA: Diagnosis not present

## 2022-09-17 DIAGNOSIS — Z86711 Personal history of pulmonary embolism: Secondary | ICD-10-CM | POA: Diagnosis not present

## 2022-09-17 DIAGNOSIS — Z7901 Long term (current) use of anticoagulants: Secondary | ICD-10-CM

## 2022-09-17 DIAGNOSIS — E782 Mixed hyperlipidemia: Secondary | ICD-10-CM

## 2022-09-17 NOTE — Patient Instructions (Signed)
Medication Instructions:     *If you need a refill on your cardiac medications before your next appointment, please call your pharmacy*   Lab Work:   None today   If you have labs (blood work) drawn today and your tests are completely normal, you will receive your results only by: MyChart Message (if you have MyChart) OR A paper copy in the mail If you have any lab test that is abnormal or we need to change your treatment, we will call you to review the results.   Testing/Procedures:    None    Follow-Up: At [facility], you and your health needs are our priority.  As part of our continuing mission to provide you with exceptional heart care, we have created designated Provider Care Teams.  These Care Teams include your primary Cardiologist (physician) and Advanced Practice Providers (APPs -  Physician Assistants and Nurse Practitioners) who all work together to provide you with the care you need, when you need it.  We recommend signing up for the patient portal called "MyChart".  Sign up information is provided on this After Visit Summary.  MyChart is used to connect with patients for Virtual Visits (Telemedicine).  Patients are able to view lab/test results, encounter notes, upcoming appointments, etc.  Non-urgent messages can be sent to your provider as well.   To learn more about what you can do with MyChart, go to [email or web address].    Your next appointment:   6 month(s)  Provider:   Norman Herrlich, MD    Other Instructions   Decrease mountain dew consumption Stop using dip Increase physical activity

## 2022-10-12 ENCOUNTER — Ambulatory Visit: Payer: Medicare HMO | Admitting: Pulmonary Disease

## 2022-10-14 ENCOUNTER — Ambulatory Visit: Payer: Medicare HMO | Admitting: Diagnostic Neuroimaging

## 2022-10-20 ENCOUNTER — Encounter: Payer: Self-pay | Admitting: Pulmonary Disease

## 2022-10-20 ENCOUNTER — Ambulatory Visit: Payer: Medicare HMO | Admitting: Pulmonary Disease

## 2022-10-20 VITALS — BP 118/78 | HR 87 | Temp 98.6°F | Ht 69.5 in | Wt 208.0 lb

## 2022-10-20 DIAGNOSIS — R0683 Snoring: Secondary | ICD-10-CM | POA: Diagnosis not present

## 2022-10-20 DIAGNOSIS — R400 Somnolence: Secondary | ICD-10-CM

## 2022-10-20 NOTE — Patient Instructions (Signed)
I will see you back in 6 months  Continue weight loss efforts  Regular exercises will help, watch your diet will help  Call us with significant concerns  Your sleep study did not show significant sleep apnea, did confirm the snoring   Options of treatment for snoring will include significant weight loss, and oral device can be fashioned by dentist however this is not covered by most insurances, surgical options may also be considered  Call us with significant concerns

## 2022-10-20 NOTE — Progress Notes (Signed)
ANTONIA DITTUS    [id number]    Dec 06, 1985  Primary Care Physician:Jordan, Garlan Fair, MD  Referring Physician: [place], Sarah T, MD [address] [place],  [place] [address]  Chief complaint:   Concern for sleep apnea  HPI:  Recently had a sleep study Sleep study was negative for significant sleep apnea  Has been trying to lose weight  Does have a history of snoring, witnessed apne[place] asleep easily Multiple awakenings Variable waking up time  Some dryness of his mouth in the morning No morning headaches  Does take medications that contribute to sleepiness during the day  Reformed smoker quit in 2019 was smoking about half a pack a day  Dad does have sleep apnea  Outpatient Encounter Medications as of 10/20/2022  Medication Sig   apixaban (ELIQUIS) 5 MG TABS tablet Take 5 mg by mouth 2 (two) times daily.   atenolol (TENORMIN) 50 MG tablet Take 25 mg by mouth daily.   atorvastatin (LIPITOR) 40 MG tablet Take 40 mg by mouth every morning.   benztropine (COGENTIN) 0.5 MG tablet Take 0.5 mg by mouth daily.   cyclobenzaprine (FLEXERIL) 10 MG tablet Take 10 mg by mouth at bedtime.   fenofibrate micronized (LOFIBRA) 134 MG capsule Take 134 mg by mouth at bedtime.   loratadine (CLARITIN) 10 MG tablet Take 10 mg by mouth every morning.   nitroGLYCERIN (NITROSTAT) 0.4 MG SL tablet Place 1 tablet (0.4 mg total) under the tongue every 5 (five) minutes as needed for chest pain.   omeprazole (PRILOSEC) 20 MG capsule Take 20 mg by mouth daily.   risperiDONE (RISPERDAL) 2 MG tablet Take 2 mg by mouth 2 (two) times daily.   tamsulosin (FLOMAX) 0.4 MG CAPS capsule Take 1 capsule (0.4 mg total) by mouth daily.   topiramate (TOPAMAX) 100 MG tablet Take 200 mg by mouth 2 (two) times daily.   VRAYLAR 3 MG capsule Take 3 mg by mouth daily.   No facility-administered encounter medications on file as of 10/20/2022.    Allergies as of 10/20/2022 - Review Complete  10/20/2022  Allergen Reaction Noted   Fentanyl Other (See Comments) 01/05/2013   Morphine Swelling     Past Medical History:  Diagnosis Date   ADD (attention deficit disorder)    Arthritis    Chest tightness 08/16/2017   Depression 11/07/2009   Qualifier: Diagnosis of  By: Koleen Distance CMA (AAMA), Berdine Addison of this note might be different from the original. Overview:  Qualifier: Diagnosis of  By: Koleen Distance CMA Duncan Dull), Leisha   DVT (deep venous thrombosis) (HCC) 2013   GERD 11/07/2009   Qualifier: Diagnosis of  By: Koleen Distance CMA (AAMA), Berdine Addison of this note might be different from the original. Overview:  Qualifier: Diagnosis of  By: Koleen Distance CMA Duncan Dull), Leisha   History of lumbar fusion 07/05/2017   Hyperlipidemia 11/07/2009   Qualifier: Diagnosis of  By: Koleen Distance CMA (AAMA), Berdine Addison of this note might be different from the original. 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   Thrombocytosis 06/29/2017    Past Surgical History:  Procedure Laterality Date   BACK SURGERY  2017   plate and screws   cyst removed      Head dermatologist removed in office   FRACTURE SURGERY Right    hand age  86   KNEE ARTHROSCOPY WITH LATERAL RELEASE Right 01/05/2013   Procedure: KNEE ARTHROSCOPY WITH DEBRIDEMENT, LATERAL RELEASE, CARTICEL PROCEDURE, OPEN ALLOGRAFT;  Surgeon: Eugenia Mcalpine, MD;  Location: [facility] [place];  Service: Orthopedics;  Laterality: Right;   KNEE SURGERY Bilateral 2013    knee   MEDIAL PATELLOFEMORAL LIGAMENT REPAIR Right 01/05/2013   Procedure: MEDIAL PATELLA FEMORAL LIGAMENT RECONSTRUCTION;  Surgeon: Eugenia Mcalpine, MD;  Location: [facility];  Service: Orthopedics;  Laterality: Right;   PATELLA-FEMORAL ARTHROPLASTY Right 08/20/2021   Procedure: PATELLA-FEMORAL ARTHROPLASTY;  Surgeon: Eugenia Mcalpine, MD;  Location: [facility];  Service: Orthopedics;  Laterality: Right;  with adductor canal   rt hand little finger pinning  childhood   TOTAL KNEE ARTHROPLASTY Left 02/24/2019   Procedure: Knee allograft MPFL reconstruction patellofemoral arthroplasty;  Surgeon: Eugenia Mcalpine, MD;  Location: [facility];  Service: Orthopedics;  Laterality: Left;  adductor canal    Family History  Problem Relation Age of Onset   Heart disease Father     Social History   Socioeconomic History   Marital status: Single    Spouse name: Not on file   Number of children: Not on file   Years of education: Not on file   Highest education level: Not on file  Occupational History   Not on file  Tobacco Use   Smoking status: Never   Smokeless tobacco: Former    Types: Chew    Quit date: 12/19/2016   Tobacco comments:    occ chew  Vaping Use   Vaping status: Never Used  Substance and Sexual Activity   Alcohol use: Not Currently    Comment: occ beer   Drug use: Yes    Types: Other-see comments    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   Sexual activity: Not Currently  Other Topics Concern   Not on file  Social History Narrative   Not on file   Social Determinants of Health   Financial Resource Strain: Not at Risk (06/19/2022)   Received from [place], [place]   Financial [employer]    Financial Resource Strain: 1  Food Insecurity: Not at Risk (06/19/2022)   Received from [place], [place]   Food Insecurity    Food: 1  Transportation Needs: Not at Risk (06/19/2022)   Received from [place], [employer] Needs    Transportation: 1  Physical Activity: At Risk (06/19/2022)   Received from [place], [place]   Physical Activity    Physical Activity: 2  Stress: Not at Risk (06/19/2022)   Received from  [place], [place]   Stress    Stress: 1  Social Connections: Not at Risk (06/19/2022)   [facility] from [facility]   Social Connections    Connectedness: 1  Intimate Partner Violence: Not on file    Review of Systems  Respiratory:  Negative for apnea and shortness of breath.   Psychiatric/Behavioral:  Positive for sleep disturbance.     Vitals:   10/20/22 1337  BP: 118/78  Pulse: 87  Temp: 98.6 F (37 C)  SpO2: 100%     Physical Exam Constitutional:      Appearance: He is obese.  HENT:     Head: Normocephalic.  Mouth/Throat:     Mouth: Mucous membranes are moist.  Eyes:     General: No scleral icterus. Cardiovascular:     Rate and Rhythm: Normal rate.     Heart sounds: No murmur heard.    No friction rub.  Pulmonary:     Effort: No respiratory distress.     Breath sounds: No stridor. No wheezing or rhonchi.  Musculoskeletal:     Cervical back: No rigidity or tenderness.  Neurological:     Mental Status: He is alert.  Psychiatric:        Mood and Affect: Mood normal.       02/05/2022    2:00 PM  Results of the Epworth flowsheet  Sitting and reading 3  Watching TV 3  Sitting, inactive in a public place (e.g. a theatre or a meeting) 0  As a passenger in a car for an hour without a break 0  Lying down to rest in the afternoon when circumstances permit 3  Sitting and talking to someone 0  Sitting quietly after a lunch without alcohol 3  In a car, while stopped for a few minutes in traffic 0  Total score 12     Data Reviewed: Sleep study reviewed with the patient showing negative sleep study, not significant for treatment No significant oxygen desaturations  Assessment:  Excessive daytime sleepiness  Snoring  History of pulmonary embolism in the past Hypertension  Study result was reviewed and discussed with the patient Options of treatment for snoring would include continuing to focus on weight loss efforts -An oral device may be used for  snoring  Surgical options for snoring can also be considered   Plan/Recommendations: Encouraged to continue weight loss efforts  Regular exercises graded exercises recommended  Discourage supine sleep  Tentative follow-up in 6 months     Virl Diamond MD [facility] 10/20/2022, 1:39 PM  CC: [place], Sarah T, MD

## 2022-11-02 ENCOUNTER — Ambulatory Visit: Payer: Medicare HMO | Admitting: Cardiology

## 2022-11-03 ENCOUNTER — Ambulatory Visit: Payer: Medicare HMO | Admitting: Diagnostic Neuroimaging

## 2022-11-10 ENCOUNTER — Other Ambulatory Visit (HOSPITAL_COMMUNITY): Payer: Self-pay | Admitting: Orthopedic Surgery

## 2022-11-10 DIAGNOSIS — Z96652 Presence of left artificial knee joint: Secondary | ICD-10-CM

## 2022-11-17 ENCOUNTER — Encounter (HOSPITAL_COMMUNITY)
Admission: RE | Admit: 2022-11-17 | Discharge: 2022-11-17 | Disposition: A | Discharge status: Home / Self Care | Payer: Medicare HMO | Source: Ambulatory Visit | Attending: Orthopedic Surgery | Admitting: Orthopedic Surgery

## 2022-11-17 DIAGNOSIS — Z96652 Presence of left artificial knee joint: Secondary | ICD-10-CM | POA: Insufficient documentation

## 2022-11-17 MED ORDER — TECHNETIUM TC 99M MEDRONATE IV KIT
20.0000 | PACK | Freq: Once | INTRAVENOUS | Status: AC | PRN
  Administered 2022-11-17: 20 via INTRAVENOUS

## 2023-02-23 ENCOUNTER — Telehealth: Payer: Self-pay | Admitting: Cardiology

## 2023-02-23 NOTE — Telephone Encounter (Signed)
Pt c/o of Chest Pain: STAT if CP now or developed within 24 hours  1. Are you having CP right now? No   2. Are you experiencing any other symptoms (ex. SOB, nausea, vomiting, sweating)? No   3. How long have you been experiencing CP? Had sharp chest pain an hour ago   4. Is your CP continuous or coming and going? Coming and going  5. Have you taken Nitroglycerin? Yes     ?

## 2023-02-23 NOTE — Telephone Encounter (Signed)
Patient calling reporting he is having chest pain. Patient reports that it has been going on for a couple of hours. Patient denies any other symptoms. He  reports taking two Nitroglycerins today, and still having sharp chest pain. Advised patient that he should go to the ER for evaluation. Patient verbalized understanding and stated he would go to the ER.

## 2023-02-24 DIAGNOSIS — R4189 Other symptoms and signs involving cognitive functions and awareness: Secondary | ICD-10-CM | POA: Insufficient documentation

## 2023-02-25 NOTE — Progress Notes (Signed)
Cardiology Office Note:  .   Date:  02/26/2023  ID:  Andre Cervantes, DOB 06-11-1985, MRN [id number] PCP: Joice Lofts, NP  [facility] Providers Cardiologist:  Norman Herrlich, MD    History of Present Illness: .   Andre Cervantes is a 38 y.o. male with a past medical history of pulmonary embolism, DVT, hypertension, GERD, hyperlipidemia, tobacco abuse, palpitations, anxiety, depression, family history of early CAD.  01/03/2021 monitor average heart rate 71 bpm, predominant rhythm sinus, Mobitz type I was present, all triggers were associated with sinus rhythm. 02/08/2018 coronary CTA calcium score of 0 10/13/2018 Lexiscan low risk study, 1 defect of mild severity in the basal inferior location 09/06/2017 stress echo normal, EF 55 to 60%, mild MR  Evaluated in ED [facility] on 06/28/2022 for chest pain. Troponins negative x 2, EKG without changes, D-dimer was negative, chest x-ray normal.  Evaluated 09/17/2022 for evaluation of atypical chest pain, his father recently passed away dealing with a lot of personal stress and advised to follow-up in 6 months.  He presents today accompanied by his mother for follow-up of atypical chest pain.  He apparently had an episode on 02/23/2023 where he needed to take nitroglycerin x 2, his chest pain was not related so presented to the emergency department.  Workup was unrevealing, EKG was unchanged and troponin was negative he was advised to follow-up with his cardiologist.  His mother voices concerns that his underwent bypass surgery at young age --38 years old.  He denies palpitations, dyspnea, pnd, orthopnea, n, v, dizziness, syncope, edema, weight gain, or early satiety.   ROS: Review of Systems  Musculoskeletal:  Positive for neck pain.  Psychiatric/Behavioral:  The patient is nervous/anxious.   All other systems reviewed and are negative.    Studies Reviewed: .      Cardiac Studies & Procedures     STRESS TESTS  MYOCARDIAL PERFUSION  IMAGING 10/13/2018  Narrative  The left ventricular ejection fraction is hyperdynamic (>65%).  Nuclear stress EF: 68%.  There was no ST segment deviation noted during stress.  Defect 1: There is a small defect of mild severity present in the basal inferior location.  The study is normal.  This is a low risk study.  ECHOCARDIOGRAM  ECHOCARDIOGRAM COMPLETE 01/17/2017  Narrative *[place]* *[facility]* [address] [place], [place] [address] [phone]  ------------------------------------------------------------------- Transthoracic Echocardiography  Patient:    Andre Cervantes MR #:       [id number] Study Date: 01/17/2017 Gender:     M Age:        31 Height:     175.3 cm Weight:     81.5 kg BSA:        2.01 m^2 Pt. Status: Room:       3E15C  SONOGRAPHER  Delcie Roch, RDCS, CCT ATTENDING    Ward, Layla Maw PERFORMING   [facility], Inpatient ADMITTING    Katrinka Blazing, Rondell A ORDERING     Smith, Rondell A  cc:  ------------------------------------------------------------------- LV EF: 55% -   60%  ------------------------------------------------------------------- Indications:      Pulmonary embolus 415.19.  ------------------------------------------------------------------- Study Conclusions  - Left ventricle: The cavity size was normal. Wall thickness was normal. Systolic function was normal. The estimated ejection fraction was in the range of 55% to 60%. Wall motion was normal; there were no regional wall motion abnormalities. - Mitral valve: There was mild regurgitation. - Right ventricle: The cavity size was normal. Wall thickness was  normal. Systolic function was normal. - Tricuspid valve: There was mild regurgitation.  ------------------------------------------------------------------- Study data:  Comparison was made to the study of 10/25/2011.  Study status:  Routine.  Procedure:  The patient reported no pain pre or post  test. Transthoracic echocardiography. Image quality was fair. Study completion:  There were no complications. Transthoracic echocardiography.  M-mode, complete 2D, spectral Doppler, and color Doppler.  Birthdate:  Patient birthdate: 04-04-1985.  Age:  Patient is 38 yr old.  Sex:  Gender: male. BMI: 26.5 kg/m^2.  Blood pressure:     105/59  Patient status: Inpatient.  Study date:  Study date: 01/17/2017. Study time: 11:16 AM.  Location:  Bedside.  -------------------------------------------------------------------  ------------------------------------------------------------------- Left ventricle:  The cavity size was normal. Wall thickness was normal. Systolic function was normal. The estimated ejection fraction was in the range of 55% to 60%. Wall motion was normal; there were no regional wall motion abnormalities. There was no evidence of elevated ventricular filling pressure by Doppler parameters.  ------------------------------------------------------------------- Aortic valve:   Trileaflet; normal thickness leaflets. Mobility was not restricted.  Doppler:  Transvalvular velocity was within the normal range. There was no stenosis. There was no regurgitation.  ------------------------------------------------------------------- Aorta:  Aortic root: The aortic root was normal in size.  ------------------------------------------------------------------- Mitral valve:   Structurally normal valve.   Mobility was not restricted.  Doppler:  Transvalvular velocity was within the normal range. There was no evidence for stenosis. There was mild regurgitation.    Peak gradient (D): 4 mm Hg.  ------------------------------------------------------------------- Left atrium:  The atrium was normal in size.  ------------------------------------------------------------------- Right ventricle:  The cavity size was normal. Wall thickness was normal. Systolic function was  normal.  ------------------------------------------------------------------- Pulmonic valve:   Poorly visualized.  Structurally normal valve. Cusp separation was normal.  Doppler:  Transvalvular velocity was within the normal range. There was no evidence for stenosis. There was no regurgitation.  ------------------------------------------------------------------- Tricuspid valve:   Structurally normal valve.    Doppler: Transvalvular velocity was within the normal range. There was mild regurgitation.  ------------------------------------------------------------------- Pulmonary artery:   The main pulmonary artery was normal-sized. Systolic pressure was within the normal range.  ------------------------------------------------------------------- Right atrium:  The atrium was normal in size.  ------------------------------------------------------------------- Pericardium:  There was no pericardial effusion.  ------------------------------------------------------------------- Systemic veins: Inferior vena cava: The vessel was normal in size.  ------------------------------------------------------------------- Measurements  Left ventricle                         Value        Reference LV ID, ED, PLAX chordal                43.8  mm     43 - 52 LV ID, ES, PLAX chordal                27.3  mm     23 - 38 LV fx shortening, PLAX chordal         38    %      >=29 LV PW thickness, ED                    7.8   mm     ---------- IVS/LV PW ratio, ED                    1.06         <=1.3 Stroke volume, 2D  74    ml     ---------- Stroke volume/bsa, 2D                  37    ml/m^2 ---------- LV e&', lateral                         15.3  cm/s   ---------- LV E/e&', lateral                       6.33         ---------- LV e&', medial                          7.29  cm/s   ---------- LV E/e&', medial                        13.28        ---------- LV e&', average                          11.3  cm/s   ---------- LV E/e&', average                       8.57         ----------  Ventricular septum                     Value        Reference IVS thickness, ED                      8.29  mm     ----------  LVOT                                   Value        Reference LVOT ID, S                             19    mm     ---------- LVOT area                              2.84  cm^2   ---------- LVOT peak velocity, S                  130   cm/s   ---------- LVOT mean velocity, S                  79.2  cm/s   ---------- LVOT VTI, S                            26.1  cm     ---------- LVOT peak gradient, S                  7     mm Hg  ----------  Aorta                                  Value        Reference Aortic root ID, ED  28    mm     ----------  Left atrium                            Value        Reference LA ID, A-P, ES                         38    mm     ---------- LA ID/bsa, A-P                         1.9   cm/m^2 <=2.2 LA volume, S                           47.1  ml     ---------- LA volume/bsa, S                       23.5  ml/m^2 ---------- LA volume, ES, 1-p A4C                 41.8  ml     ---------- LA volume/bsa, ES, 1-p A4C             20.8  ml/m^2 ---------- LA volume, ES, 1-p A2C                 50.6  ml     ---------- LA volume/bsa, ES, 1-p A2C             25.2  ml/m^2 ----------  Mitral valve                           Value        Reference Mitral E-wave peak velocity            96.8  cm/s   ---------- Mitral A-wave peak velocity            75    cm/s   ---------- Mitral deceleration time               197   ms     150 - 230 Mitral peak gradient, D                4     mm Hg  ---------- Mitral E/A ratio, peak                 1.3          ----------  Pulmonary arteries                     Value        Reference PA pressure, S, DP                     28    mm Hg  <=30  Tricuspid valve                        Value         Reference Tricuspid regurg peak velocity         252   cm/s   ---------- Tricuspid peak RV-RA gradient          25    mm Hg  ----------  Right atrium  Value        Reference RA ID, S-I, ES, A4C                    47    mm     34 - 49 RA area, ES, A4C                       11.4  cm^2   8.3 - 19.5 RA volume, ES, A/L                     23.2  ml     ---------- RA volume/bsa, ES, A/L                 11.6  ml/m^2 ----------  Systemic veins                         Value        Reference Estimated CVP                          3     mm Hg  ----------  Right ventricle                        Value        Reference TAPSE                                  26    mm     ---------- RV pressure, S, DP                     28    mm Hg  <=30 RV s&', lateral, S                      15.3  cm/s   ----------  Legend: (L)  and  (H)  mark values outside specified reference range.  ------------------------------------------------------------------- Prepared and Electronically Authenticated by  Donato Schultz, M.D. 2018-12-23T12:53:05   MONITORS  LONG TERM MONITOR (3-14 DAYS) 12/30/2020  Narrative Patch Wear Time:  6 days and 20 hours (2022-11-16T09:10:14-0500 to 2022-11-23T06:00:19-0500)  Patient had a min HR of 39 bpm, max HR of 118 bpm, and avg HR of 71 bpm. Predominant underlying rhythm was Sinus Rhythm. Second Degree AV Block-Mobitz I (Wenckebach) was present. Isolated SVEs were rare (<1.0%), SVE Couplets were rare (<1.0%), and no SVE Triplets were present. Isolated VEs were rare (<1.0%), and no VE Couplets or VE Triplets were present.  Symptomatic events all sinus rhythm Both ventricular and supraventricular ectopy rare There are rare episodes of Mobitz 1 second-degree heart block without significant pauses or symptoms  Conclusion unremarkable event monitor.  CT SCANS  CT CORONARY MORPH W/CTA COR W/SCORE 03/01/2018  Addendum 03/01/2018  4:36 PM ADDENDUM REPORT: 03/01/2018  16:34  CLINICAL DATA:  38 year old male with hypertension, hyperlipidemia and typical chest pain.  EXAM: Cardiac/Coronary  CT  TECHNIQUE: The patient was scanned on a [employer].  FINDINGS: A 120 kV prospective scan was triggered in the descending thoracic aorta at 111 HU's. Axial non-contrast 3 mm slices were carried out through the heart. The data set was analyzed on a dedicated work station and scored using the Agatson method. Gantry rotation speed was 250 msecs and collimation was .6  mm. No beta blockade and 0.8 mg of sl NTG was given. The 3D data set was reconstructed in 5% intervals of the 67-82 % of the R-R cycle. Diastolic phases were analyzed on a dedicated work station using MPR, MIP and VRT modes. The patient received 80 cc of contrast.  Aorta:  Normal size.  No calcifications.  No dissection.  Aortic Valve:  Trileaflet.  No calcifications.  Coronary Arteries:  Normal coronary origin.  Right dominance.  RCA is a large dominant artery that gives rise to PDA and PLVB. There is no plaque.  Left main is a large artery that gives rise to LAD and LCX arteries.  LAD is a large vessel that has gives rise to two diagonal arteries and has no plaque.  LCX is a non-dominant artery that gives rise to one large OM1 branch. There is no plaque.  Other findings:  Normal pulmonary vein drainage into the left atrium.  Normal let atrial appendage without a thrombus.  Normal size of the pulmonary artery.  IMPRESSION: 1. Coronary calcium score of 0. This was 0 percentile for age and sex matched control.  2. Normal coronary origin with right dominance.  3. No evidence of CAD.  Consider non-cardiac causes of chest pain.   Electronically Signed By: Tobias Alexander On: 03/01/2018 16:34  Addendum 03/01/2018  3:29 PM ADDENDUM REPORT: 03/01/2018 15:27  CLINICAL DATA:  Chest pain  EXAM: Cardiac CTA  MEDICATIONS: Sub lingual nitro. 4 mg and lopressor  5mg   TECHNIQUE: The patient was scanned on a [employer] 192 scanner. Gantry rotation speed was 250 msecs. Collimation was. 6 mm . A 120 kV prospective scan was triggered in the ascending thoracic aorta at 140 HU's with full mA between 30-70% of the R-R interval . Average HR during the scan was 61 bpm. The 3D data set was interpreted on a dedicated work station using MPR, MIP and VRT modes. A total of 80 cc of contrast was used.  FINDINGS: Non-cardiac: See separate report from [facility]. No significant findings on limited lung and soft tissue windows.  Calcium score: No calcium noted  Coronary Arteries: Right dominant with no anomalies  LM: Normal  LAD: Normal  D1: Normal  D2: Normal  Circumflex: Normal  OM1: Normal  OM2: Normal  RCA: Normal  PDA: Normal  PLA: Normal  IMPRESSION: 1.  Calcium score 0  2.  Normal right dominant coronary arteries with no anomaly  3.  Normal aortic root 2.7 cm  Charlton Haws   Electronically Signed By: Charlton Haws M.D. On: 03/01/2018 15:27  Narrative EXAM: OVER-READ INTERPRETATION  CT CHEST  The following report is an over-read performed by radiologist Dr. Charlett Nose of [facility] on 03/01/2018. This over-read does not include interpretation of cardiac or coronary anatomy or pathology. The coronary CTA interpretation by the cardiologist is attached.  COMPARISON:  03/17/2017  FINDINGS: Vascular: Heart is normal size.  Aorta is normal caliber.  Mediastinum/Nodes: No adenopathy in the lower mediastinum or hila.  Lungs/Pleura: No confluent opacities or effusions.  Upper Abdomen: Imaging into the upper abdomen shows no acute findings.  Musculoskeletal: Chest wall soft tissues are unremarkable. No acute bony abnormality.  IMPRESSION: No acute or significant extracardiac abnormality.  Electronically Signed: By: Charlett Nose M.D. On: 03/01/2018 14:51                    Physical  Exam:   VS:  BP 120/80   Pulse 84  Ht 5' 9.6" (1.768 m)   Wt 207 lb 3.2 oz (94 kg)   SpO2 98%   BMI 30.07 kg/m    Wt Readings from Last 3 Encounters:  02/26/23 207 lb 3.2 oz (94 kg)  10/20/22 208 lb (94.3 kg)  09/17/22 208 lb 6.4 oz (94.5 kg)    GEN: Well nourished, well developed in no acute distress NECK: No JVD; No carotid bruits CARDIAC: RRR, no murmurs, rubs, gallops RESPIRATORY:  Clear to auscultation without rales, wheezing or rhonchi  ABDOMEN: Soft, non-tender, non-distended EXTREMITIES:  No edema; No deformity   ASSESSMENT AND PLAN: .   Atypical chest pain -previously had a coronary CTA 5 years ago which revealed a calcium score of 0.  He continues to have episodes of chest pain that are concerning for him and his mother.  He also has family history of early CAD with his father undergoing quadruple bypass surgery at 37 years old.  Will arrange for repeat coronary CTA.  Most recent creatinine on file for review is elevated at 1.44 will repeat BMET.  HTN - BP is well controlled at 120/80, currently not on any antihypertensive agents.  History of DVT/PE - chronic anticoagulation with Eliquis, managed by his PCP.   Mixed dyslipidemia - managed by PCP, continue lofibra, atorvastatin         Dispo: Coronary CTA, BMET.  Follow-up in 1 year.  Signed, Flossie Dibble, NP

## 2023-02-26 ENCOUNTER — Ambulatory Visit: Payer: 59 | Attending: Cardiology | Admitting: Cardiology

## 2023-02-26 ENCOUNTER — Encounter: Payer: Self-pay | Admitting: Cardiology

## 2023-02-26 VITALS — BP 120/80 | HR 84 | Ht 69.6 in | Wt 207.2 lb

## 2023-02-26 DIAGNOSIS — R072 Precordial pain: Secondary | ICD-10-CM | POA: Diagnosis not present

## 2023-02-26 DIAGNOSIS — Z7901 Long term (current) use of anticoagulants: Secondary | ICD-10-CM | POA: Diagnosis not present

## 2023-02-26 DIAGNOSIS — Z86711 Personal history of pulmonary embolism: Secondary | ICD-10-CM | POA: Diagnosis not present

## 2023-02-26 DIAGNOSIS — Z01812 Encounter for preprocedural laboratory examination: Secondary | ICD-10-CM

## 2023-02-26 DIAGNOSIS — R079 Chest pain, unspecified: Secondary | ICD-10-CM

## 2023-02-26 MED ORDER — METOPROLOL TARTRATE 100 MG PO TABS: 100.0000 mg | ORAL_TABLET | Freq: Once | ORAL | 0 refills | Status: AC

## 2023-02-26 NOTE — Patient Instructions (Signed)
Medication Instructions:  Your physician recommends that you continue on your current medications as directed. Please refer to the Current Medication list given to you today.  *If you need a refill on your cardiac medications before your next appointment, please call your pharmacy*   Lab Work: Your physician recommends that you return for lab work in: Today for basic metabolic panel  If you have labs (blood work) drawn today and your tests are completely normal, you will receive your results only by: MyChart Message (if you have MyChart) OR A paper copy in the mail If you have any lab test that is abnormal or we need to change your treatment, we will call you to review the results.   Testing/Procedures:   Your cardiac CT will be scheduled at one of the below locations:   [facility] [address] [place], [place] [address] [phone]   If scheduled at [facility], please arrive at the [facility] and Children's Entrance (Entrance C2) of [facility] 30 minutes prior to test start time. You can use the FREE valet parking offered at entrance C (encouraged to control the heart rate for the test)  Proceed to the [facility] Radiology Department (first floor) to check-in and test prep.  All radiology patients and guests should use entrance C2 at [facility], accessed from [facility], even though the hospital's physical address listed is [address]      Please follow these instructions carefully (unless otherwise directed):  An IV will be required for this test and Nitroglycerin will be given.  Hold all erectile dysfunction medications at least 3 days (72 hrs) prior to test. (Ie viagra, cialis, sildenafil, tadalafil, etc)   On the Night Before the Test: Be sure to Drink plenty of water. Do not consume any caffeinated/decaffeinated beverages or chocolate 12 hours prior to your test. Do not take any antihistamines 12  hours prior to your test. On the Day of the Test: Drink plenty of water until 1 hour prior to the test. Do not eat any food 1 hour prior to test. You may take your regular medications prior to the test.  Take metoprolol (Lopressor) two hours prior to test. Hold your Atenolol the day of the test. After the Test: Drink plenty of water. After receiving IV contrast, you may experience a mild flushed feeling. This is normal. On occasion, you may experience a mild rash up to 24 hours after the test. This is not dangerous. If this occurs, you can take Benadryl 25 mg and increase your fluid intake. If you experience trouble breathing, this can be serious. If it is severe call 911 IMMEDIATELY. If it is mild, please call our office.  We will call to schedule your test 2-4 weeks out understanding that some insurance companies will need an authorization prior to the service being performed.   For more information and frequently asked questions, please visit our website : [email or web address]  For non-scheduling related questions, please contact the cardiac imaging nurse navigator should you have any questions/concerns: Cardiac Imaging Nurse Navigators Direct Office Dial: [phone]   For scheduling needs, including cancellations and rescheduling, please call [place], [phone].    Follow-Up: At [facility], you and your health needs are our priority.  As part of our continuing mission to provide you with exceptional heart care, we have created designated Provider Care Teams.  These Care Teams include your primary Cardiologist (physician) and Advanced Practice Providers (APPs -  Physician Assistants and Nurse Practitioners) who all work together to provide you with the care you need, when you need it.  We recommend signing up for the patient portal called "MyChart".  Sign up information is provided on this After Visit Summary.  MyChart is used to connect with patients for  Virtual Visits (Telemedicine).  Patients are able to view lab/test results, encounter notes, upcoming appointments, etc.  Non-urgent messages can be sent to your provider as well.   To learn more about what you can do with MyChart, go to [email or web address].    Your next appointment:   1 year(s)  Provider:   Norman Herrlich, MD    Other Instructions Cardiac CT Angiogram A cardiac CT angiogram is a procedure to look at the heart and the area around the heart. It may be done to help find the cause of chest pains or other symptoms of heart disease. During this procedure, a substance called contrast dye is injected into a vein in the arm. The contrast highlights the blood vessels in the area to be checked. A large X-ray machine (CT scanner), then takes detailed pictures of the heart and the surrounding area. The procedure is also sometimes called a coronary CT angiogram, coronary artery scanning, or CTA. A cardiac CT angiogram allows the health care provider to see how well blood is flowing to and from the heart. The provider will be able to see if there are any problems, such as: Blockage or narrowing of the arteries in the heart. Fluid around the heart. Signs of weakness or disease in the muscles, valves, and tissues of the heart. Tell a health care provider about: Any allergies you have. This is especially important if you have had a previous allergic reaction to medicines, contrast dye, or iodine. All medicines you are taking, including vitamins, herbs, eye drops, creams, and over-the-counter medicines. Any bleeding problems you have. Any surgeries you have had. Any medical conditions you have, including kidney problems or kidney failure. Whether you are pregnant or may be pregnant. Any anxiety disorders, chronic pain, or other conditions you have. These may increase your stress or prevent you from lying still. Any history of abnormal heart rhythms or heart procedures. What are the  risks? Your provider will talk with you about risks. These may include: Bleeding. Infection. Allergic reactions to medicines or dyes. Damage to other structures or organs. Kidney damage from the contrast dye. Increased risk of cancer from radiation exposure. This risk is low. Talk with your provider about: The risks and benefits of testing. How you can receive the lowest dose of radiation. What happens before the procedure? Wear comfortable clothing and remove any jewelry, glasses, dentures, and hearing aids. Follow instructions from your provider about eating and drinking. These may include: 12 hours before the procedure Avoid caffeine. This includes tea, coffee, soda, energy drinks, and diet pills. Drink plenty of water or other fluids that do not have caffeine in them. Being well hydrated can prevent complications. 4-6 hours before the procedure Stop eating and drinking. This will reduce the risk of nausea from the contrast dye. Ask your provider about changing or stopping your regular medicines. These include: Diabetes medicines. Medicines to treat problems with erections (erectile dysfunction). If you have kidney problems, you may need to receive IV hydration before and after the test. What happens during the procedure?  Hair on your chest may need to be removed so that small sticky patches called electrodes can be placed on your chest. These  will transmit information that helps to monitor your heart during the procedure. An IV will be inserted into one of your veins. You might be given a medicine to control your heart rate during the procedure. This will help to ensure that good images are obtained. You will be asked to lie on an exam table. This table will slide in and out of the CT machine during the procedure. Contrast dye will be injected into the IV. You might feel warm, or you may get a metallic taste in your mouth. You may be given medicines to relax or dilate the arteries  in your heart. If you are allergic to contrast dyes or iodine you may be given medicine before the test to reduce the risk of an allergic reaction. The table that you are lying on will move into the CT machine tunnel for the scan. The person running the machine will give you instructions while the scans are being done. You may be asked to: Keep your arms above your head. Hold your breath for short periods. Stay very still, even if the table is moving. The procedure may vary among providers and hospitals. What can I expect after the procedure? After your procedure, it is common to have: A metallic taste in your mouth from the contrast dye. A feeling of warmth. A headache from the heart medicine. Follow these instructions at home: Take over-the-counter and prescription medicines only as told by your provider. If you are told, drink enough fluid to keep your pee pale yellow. This will help to flush the contrast dye out of your body. Most people can return to their normal activities right after the procedure. Ask your provider what activities are safe for you. It is up to you to get the results of your procedure. Ask your provider, or the department that is doing the procedure, when your results will be ready. Contact a health care provider if: You have any symptoms of allergy to the contrast dye. These include: Shortness of breath. Rash or hives. A racing heartbeat. You notice a change in your peeing (urination). This information is not intended to replace advice given to you by your health care provider. Make sure you discuss any questions you have with your health care provider. Document Revised: 08/15/2021 Document Reviewed: 08/15/2021 Elsevier Patient Education  2024 [employer].

## 2023-02-27 LAB — BASIC METABOLIC PANEL WITH GFR
BUN/Creatinine Ratio: 6 — ABNORMAL LOW (ref 9–20)
BUN: 7 mg/dL (ref 6–20)
CO2: 17 mmol/L — ABNORMAL LOW (ref 20–29)
Calcium: 9.4 mg/dL (ref 8.7–10.2)
Chloride: 114 mmol/L — ABNORMAL HIGH (ref 96–106)
Creatinine, Ser: 1.25 mg/dL (ref 0.76–1.27)
Glucose: 66 mg/dL — ABNORMAL LOW (ref 70–99)
Potassium: 4.3 mmol/L (ref 3.5–5.2)
Sodium: 144 mmol/L (ref 134–144)
eGFR: 77 mL/min/1.73

## 2023-03-01 ENCOUNTER — Telehealth: Payer: Self-pay | Admitting: Emergency Medicine

## 2023-03-01 NOTE — Telephone Encounter (Signed)
-----   Message from Flossie Dibble sent at 02/27/2023  2:18 PM EST ----- Stable chemistry panel.

## 2023-03-01 NOTE — Telephone Encounter (Signed)
 Results reviewed with pt as per Wallis Bamberg NP's note.  Pt verbalized understanding and had no additional questions. Routed to PCP.

## 2023-03-16 ENCOUNTER — Telehealth (HOSPITAL_COMMUNITY): Payer: Self-pay | Admitting: *Deleted

## 2023-03-16 NOTE — Telephone Encounter (Signed)
 Attempted to call patient regarding upcoming cardiac CT appointment. Unable to leave VM. Johney Frame RN Navigator Cardiac Imaging [facility] [phone] Office

## 2023-03-17 ENCOUNTER — Ambulatory Visit (HOSPITAL_COMMUNITY): Admission: RE | Admit: 2023-03-17 | Payer: 59 | Source: Ambulatory Visit

## 2023-04-06 ENCOUNTER — Telehealth (HOSPITAL_COMMUNITY): Payer: Self-pay | Admitting: *Deleted

## 2023-04-06 NOTE — Telephone Encounter (Signed)
 Attempted to call patient regarding upcoming cardiac CT appointment. Unable to leave VM. Johney Frame RN Navigator Cardiac Imaging [facility] [phone] Office

## 2023-04-07 ENCOUNTER — Ambulatory Visit (HOSPITAL_COMMUNITY)
Admission: RE | Admit: 2023-04-07 | Discharge: 2023-04-07 | Disposition: A | Discharge status: Home / Self Care | Payer: 59 | Source: Ambulatory Visit | Attending: Cardiology | Admitting: Cardiology

## 2023-04-07 ENCOUNTER — Encounter (HOSPITAL_COMMUNITY): Payer: Self-pay

## 2023-04-07 DIAGNOSIS — R072 Precordial pain: Secondary | ICD-10-CM | POA: Diagnosis present

## 2023-04-07 MED ORDER — NITROGLYCERIN 0.4 MG SL SUBL: 0.8000 mg | SUBLINGUAL_TABLET | Freq: Once | SUBLINGUAL | Status: DC

## 2023-04-07 MED ORDER — SODIUM CHLORIDE 0.9 % IV BOLUS
500.0000 mL | Freq: Once | INTRAVENOUS | Status: AC
  Administered 2023-04-07: 500 mL via INTRAVENOUS

## 2023-04-07 NOTE — Progress Notes (Addendum)
 Pt remains hypotensive with systolic BP 90's despite 500 ml NS bolus; pt asymptomatic; Dr. Mayford Knife notified; Per Dr. Mayford Knife, pt will need to have CT scan rescheduled; cardiac nurse navigator team notified and pt verbalized understanding that scan will need to be rescheduled

## 2023-04-16 ENCOUNTER — Telehealth (HOSPITAL_COMMUNITY): Payer: Self-pay | Admitting: *Deleted

## 2023-04-16 ENCOUNTER — Other Ambulatory Visit: Payer: Self-pay | Admitting: Cardiology

## 2023-04-16 ENCOUNTER — Other Ambulatory Visit (HOSPITAL_COMMUNITY): Payer: Self-pay | Admitting: *Deleted

## 2023-04-16 DIAGNOSIS — R079 Chest pain, unspecified: Secondary | ICD-10-CM

## 2023-04-16 MED ORDER — METOPROLOL TARTRATE 50 MG PO TABS: ORAL_TABLET | ORAL | 0 refills | Status: DC

## 2023-04-16 MED ORDER — METOPROLOL TARTRATE 50 MG PO TABS: ORAL_TABLET | ORAL | 0 refills | Status: AC

## 2023-04-16 NOTE — Telephone Encounter (Signed)
 Attempted to call patient regarding upcoming cardiac CT appointment. Left message on voicemail with name and callback number  Larey Brick RN Navigator Cardiac Imaging [facility] [phone] Office [phone] Cell

## 2023-04-16 NOTE — Telephone Encounter (Signed)
 Patient returning call about his upcoming cardiac imaging study; pt verbalizes understanding of appt date/time, parking situation and where to check in, pre-test NPO status and medications ordered, and verified current allergies; name and call back number provided for further questions should they arise  Larey Brick RN Navigator Cardiac Imaging [facility] [phone] office [phone] cell  Patient to hold his daily atenolol and take 50mg  metoprolol tartrate two hours prior to his cardiac CT scan. He is aware to arrive at 9 AM.

## 2023-04-19 ENCOUNTER — Ambulatory Visit (HOSPITAL_COMMUNITY)
Admission: RE | Admit: 2023-04-19 | Discharge: 2023-04-19 | Disposition: A | Discharge status: Home / Self Care | Source: Ambulatory Visit | Attending: Cardiology | Admitting: Cardiology

## 2023-04-19 DIAGNOSIS — R0789 Other chest pain: Secondary | ICD-10-CM | POA: Insufficient documentation

## 2023-04-19 DIAGNOSIS — R079 Chest pain, unspecified: Secondary | ICD-10-CM | POA: Diagnosis present

## 2023-04-19 DIAGNOSIS — J8410 Pulmonary fibrosis, unspecified: Secondary | ICD-10-CM | POA: Diagnosis not present

## 2023-04-19 MED ORDER — DILTIAZEM HCL 25 MG/5ML IV SOLN: 10.0000 mg | INTRAVENOUS | Status: DC | PRN

## 2023-04-19 MED ORDER — NITROGLYCERIN 0.4 MG SL SUBL
0.8000 mg | SUBLINGUAL_TABLET | Freq: Once | SUBLINGUAL | Status: AC
  Administered 2023-04-19: 0.8 mg via SUBLINGUAL

## 2023-04-19 MED ORDER — IOHEXOL 350 MG/ML SOLN
95.0000 mL | Freq: Once | INTRAVENOUS | Status: AC | PRN
  Administered 2023-04-19: 95 mL via INTRAVENOUS

## 2023-04-19 MED ORDER — IOHEXOL 350 MG/ML SOLN: 100.0000 mL | Freq: Once | INTRAVENOUS | Status: DC | PRN

## 2023-04-19 MED ORDER — METOPROLOL TARTRATE 5 MG/5ML IV SOLN: 10.0000 mg | Freq: Once | INTRAVENOUS | Status: DC | PRN

## 2023-04-19 MED ORDER — NITROGLYCERIN 0.4 MG SL SUBL
SUBLINGUAL_TABLET | SUBLINGUAL | Status: AC
  Filled 2023-04-19: qty 2

## 2023-04-22 ENCOUNTER — Telehealth: Payer: Self-pay | Admitting: Emergency Medicine

## 2023-04-22 NOTE — Telephone Encounter (Signed)
 Called patient x2 with no answer

## 2023-04-22 NOTE — Telephone Encounter (Signed)
-----   Message from Flossie Dibble sent at 04/19/2023 12:12 PM EDT ----- Your coronary CTA revealed that there is a small amount of calcified plaque in the arteries of your heart, but there is nothing obstructing blood flow therefore this is not the cause of the chest pain you have been having.  This is a good result.  I would encourage you to avoid or decrease your caffeine intake, work on daily exercising.  Keep follow-up in 1 year with Dr. Dulce Sellar.

## 2023-04-23 NOTE — Telephone Encounter (Signed)
 No answer

## 2023-04-26 NOTE — Telephone Encounter (Signed)
 No answer on patients home number or mothers number.

## 2023-04-28 ENCOUNTER — Encounter: Payer: Self-pay | Admitting: Emergency Medicine

## 2023-04-29 ENCOUNTER — Telehealth: Payer: Self-pay | Admitting: Cardiology

## 2023-04-29 NOTE — Telephone Encounter (Signed)
Attempted to call back, no answer.

## 2023-04-29 NOTE — Telephone Encounter (Signed)
Patient states he is returning call. Please advise.

## 2023-04-30 NOTE — Telephone Encounter (Signed)
 Attempted to call the patient twice. Received the message twice that said "the call could not be completed as dialed"

## 2023-05-03 ENCOUNTER — Encounter: Payer: Self-pay | Admitting: Emergency Medicine

## 2023-05-03 NOTE — Telephone Encounter (Signed)
 We were unable to contact the patient so a letter was mailed to the patient informing him of his results.

## 2023-05-03 NOTE — Progress Notes (Signed)
 Attempted to call the patient. Patient did not answer the phone. Patient did not have voice mail so unable to leave a message.

## 2023-05-18 ENCOUNTER — Telehealth: Payer: Self-pay | Admitting: Cardiology

## 2023-05-18 NOTE — Telephone Encounter (Signed)
 Phone number updated.

## 2023-05-18 NOTE — Telephone Encounter (Signed)
 Mother stated patient has moved out and has a new telephone number [phone].

## 2023-05-19 ENCOUNTER — Telehealth: Payer: Self-pay

## 2023-05-19 NOTE — Telephone Encounter (Signed)
 Patient called the office asking about the results of his cardiac CT. The patient was informed of the results of his cardiac CT below:  "Your coronary CTA revealed that there is a small amount of calcified plaque in the arteries of your heart, but there is nothing obstructing blood flow therefore this is not the cause of the chest pain you have been having.  This is a good result.  I would encourage you to avoid or decrease your caffeine intake, work on daily exercising.  Keep follow-up in 1 year with Dr. Sandee Crook".  Patient verbalized understanding and had no further questions at this time.

## 2023-08-25 ENCOUNTER — Telehealth: Payer: Self-pay | Admitting: Cardiology

## 2023-08-25 DIAGNOSIS — Z86711 Personal history of pulmonary embolism: Secondary | ICD-10-CM

## 2023-08-25 DIAGNOSIS — E871 Hypo-osmolality and hyponatremia: Secondary | ICD-10-CM

## 2023-08-25 DIAGNOSIS — R0789 Other chest pain: Secondary | ICD-10-CM

## 2023-08-25 NOTE — Telephone Encounter (Signed)
*  STAT* If patient is at the pharmacy, call can be transferred to refill team.   1. Which medications need to be refilled? (please list name of each medication and dose if known) nitroGLYCERIN  (NITROSTAT ) 0.4 MG SL tablet    2. Would you like to learn more about the convenience, safety, & potential cost savings by using the [facility]?     3. Are you open to using the [facility] (Type [facility]. ).   4. Which pharmacy/location (including street and city if local pharmacy) is medication to be sent to? [facility] [place] - [address]    5. Do they need a 30 day or 90 day supply? 90 day

## 2023-08-26 ENCOUNTER — Other Ambulatory Visit (HOSPITAL_COMMUNITY): Payer: Self-pay

## 2023-08-26 MED ORDER — NITROGLYCERIN 0.4 MG SL SUBL
0.4000 mg | SUBLINGUAL_TABLET | SUBLINGUAL | 2 refills | Status: DC | PRN
  Filled 2023-08-26: qty 25, 15d supply, fill #0

## 2023-09-06 ENCOUNTER — Other Ambulatory Visit (HOSPITAL_COMMUNITY): Payer: Self-pay

## 2023-10-19 ENCOUNTER — Telehealth: Payer: Self-pay | Admitting: Cardiology

## 2023-10-19 NOTE — Telephone Encounter (Signed)
 Left message for the patient to call back.

## 2023-10-19 NOTE — Telephone Encounter (Signed)
 Pt c/o of Chest Pain: 1. Are you having CP right now? no 2. Are you experiencing any other symptoms (ex. SOB, nausea, vomiting, sweating)? Can't keep anything down, lost over 40 pounds. Hurting on left side this morning but no symptoms right now 3. How long have you been experiencing CP? About a week 4. Is your CP continuous or coming and going? Coming and going 5. Have you taken Nitroglycerin ? No  Patient is very hard to understand over the phone

## 2023-10-20 NOTE — Telephone Encounter (Signed)
 Left message for the patient to call back.

## 2023-10-21 NOTE — Telephone Encounter (Signed)
 Left message for the patient to call back.

## 2023-10-22 NOTE — Telephone Encounter (Signed)
 Left message for the patient to call back.

## 2023-10-25 NOTE — Telephone Encounter (Signed)
 Called the patient on his alternate phone number and he reported that he had been having chest tightness last night and had to take 1 nitroglycerin  to relieve the pain. At that time his blood pressure was 146/91. He also reported that he had been having chest pain off and on for about a week and some time his heart will beat fast for 5 - 10 minutes. He currently was not having chest pain at this time. He also reported that he has recently lost 40 lbs and he was hurting on his left side in the past. Patient stated that he had been having a lot of loose stools as well. Based on the patient's cardiac and gastrointestinal symptoms that he reported, I recommended that he go to the ER to be evaluated. Patient stated that he would call someone to drive him to the ER. Patient verbalized understanding and had no further questions at this time.

## 2023-12-17 ENCOUNTER — Other Ambulatory Visit: Payer: Self-pay | Admitting: Cardiology

## 2023-12-17 DIAGNOSIS — R0789 Other chest pain: Secondary | ICD-10-CM

## 2023-12-17 DIAGNOSIS — E871 Hypo-osmolality and hyponatremia: Secondary | ICD-10-CM

## 2023-12-17 DIAGNOSIS — Z86711 Personal history of pulmonary embolism: Secondary | ICD-10-CM

## 2024-02-09 ENCOUNTER — Ambulatory Visit: Admitting: Family Medicine

## 2024-02-22 ENCOUNTER — Ambulatory Visit: Admitting: Cardiology

## 2024-02-28 ENCOUNTER — Ambulatory Visit: Admitting: Cardiology

## 2024-03-30 ENCOUNTER — Ambulatory Visit: Admitting: Family Medicine

## 2024-04-24 ENCOUNTER — Ambulatory Visit: Admitting: Cardiology
